# Patient Record
Sex: Female | Born: 1940
Health system: Southern US, Community
[De-identification: ages and names within clinical notes are randomized; demographics above are authoritative.]

## PROBLEM LIST (undated history)

## (undated) DIAGNOSIS — E785 Hyperlipidemia, unspecified: Secondary | ICD-10-CM

## (undated) DIAGNOSIS — I10 Essential (primary) hypertension: Secondary | ICD-10-CM

## (undated) DIAGNOSIS — R519 Headache, unspecified: Secondary | ICD-10-CM

## (undated) DIAGNOSIS — E119 Type 2 diabetes mellitus without complications: Secondary | ICD-10-CM

## (undated) DIAGNOSIS — R51 Headache: Secondary | ICD-10-CM

## (undated) DIAGNOSIS — K5792 Diverticulitis of intestine, part unspecified, without perforation or abscess without bleeding: Secondary | ICD-10-CM

## (undated) DIAGNOSIS — G8929 Other chronic pain: Secondary | ICD-10-CM

## (undated) DIAGNOSIS — M25569 Pain in unspecified knee: Secondary | ICD-10-CM

## (undated) HISTORY — PX: VESICOVAGINAL FISTULA CLOSURE W/ TAH: SUR271

## (undated) HISTORY — DX: Headache, unspecified: R51.9

## (undated) HISTORY — DX: Type 2 diabetes mellitus without complications: E11.9

## (undated) HISTORY — DX: Other chronic pain: G89.29

## (undated) HISTORY — DX: Hyperlipidemia, unspecified: E78.5

## (undated) HISTORY — DX: Headache: R51

## (undated) HISTORY — PX: OTHER SURGICAL HISTORY: SHX169

## (undated) HISTORY — DX: Essential (primary) hypertension: I10

## (undated) HISTORY — DX: Pain in unspecified knee: M25.569

---
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
## 1969-01-11 HISTORY — PX: OTHER SURGICAL HISTORY: SHX169

## 1971-01-12 HISTORY — PX: PARTIAL HYSTERECTOMY: SHX80

## 1997-05-10 ENCOUNTER — Ambulatory Visit (HOSPITAL_COMMUNITY): Admission: RE | Admit: 1997-05-10 | Discharge: 1997-05-10 | Payer: Self-pay | Admitting: Internal Medicine

## 1999-08-07 ENCOUNTER — Ambulatory Visit (HOSPITAL_COMMUNITY): Admission: RE | Admit: 1999-08-07 | Discharge: 1999-08-07 | Payer: Self-pay | Admitting: Gastroenterology

## 1999-09-03 ENCOUNTER — Ambulatory Visit (HOSPITAL_COMMUNITY): Admission: RE | Admit: 1999-09-03 | Discharge: 1999-09-03 | Payer: Self-pay | Admitting: Gastroenterology

## 1999-09-03 ENCOUNTER — Encounter: Payer: Self-pay | Admitting: Gastroenterology

## 1999-09-22 ENCOUNTER — Encounter: Admission: RE | Admit: 1999-09-22 | Discharge: 1999-09-22 | Payer: Self-pay | Admitting: Gastroenterology

## 1999-09-22 ENCOUNTER — Encounter: Payer: Self-pay | Admitting: Gastroenterology

## 2000-10-14 ENCOUNTER — Emergency Department (HOSPITAL_COMMUNITY): Admission: EM | Admit: 2000-10-14 | Discharge: 2000-10-15 | Payer: Self-pay | Admitting: Emergency Medicine

## 2001-01-30 ENCOUNTER — Encounter: Admission: RE | Admit: 2001-01-30 | Discharge: 2001-01-30 | Payer: Self-pay | Admitting: Gastroenterology

## 2001-01-30 ENCOUNTER — Encounter: Payer: Self-pay | Admitting: Gastroenterology

## 2001-06-21 ENCOUNTER — Encounter: Payer: Self-pay | Admitting: Internal Medicine

## 2001-06-21 ENCOUNTER — Encounter: Admission: RE | Admit: 2001-06-21 | Discharge: 2001-06-21 | Payer: Self-pay | Admitting: Internal Medicine

## 2001-07-27 ENCOUNTER — Encounter: Admission: RE | Admit: 2001-07-27 | Discharge: 2001-07-27 | Payer: Self-pay | Admitting: Neurological Surgery

## 2001-07-27 ENCOUNTER — Encounter: Payer: Self-pay | Admitting: Neurological Surgery

## 2001-08-10 ENCOUNTER — Encounter: Payer: Self-pay | Admitting: Neurological Surgery

## 2001-08-10 ENCOUNTER — Encounter: Admission: RE | Admit: 2001-08-10 | Discharge: 2001-08-10 | Payer: Self-pay | Admitting: Neurological Surgery

## 2001-10-21 ENCOUNTER — Encounter: Payer: Self-pay | Admitting: Internal Medicine

## 2001-10-21 ENCOUNTER — Encounter: Admission: RE | Admit: 2001-10-21 | Discharge: 2001-10-21 | Payer: Self-pay | Admitting: Internal Medicine

## 2004-06-22 ENCOUNTER — Encounter: Admission: RE | Admit: 2004-06-22 | Discharge: 2004-09-20 | Payer: Self-pay | Admitting: Internal Medicine

## 2004-06-26 ENCOUNTER — Encounter (INDEPENDENT_AMBULATORY_CARE_PROVIDER_SITE_OTHER): Payer: Self-pay | Admitting: *Deleted

## 2004-06-29 ENCOUNTER — Ambulatory Visit (HOSPITAL_COMMUNITY): Admission: RE | Admit: 2004-06-29 | Discharge: 2004-06-29 | Payer: Self-pay | Admitting: Gastroenterology

## 2005-03-09 ENCOUNTER — Encounter: Admission: RE | Admit: 2005-03-09 | Discharge: 2005-03-09 | Payer: Self-pay | Admitting: Internal Medicine

## 2006-02-25 ENCOUNTER — Emergency Department (HOSPITAL_COMMUNITY): Admission: EM | Admit: 2006-02-25 | Discharge: 2006-02-25 | Payer: Self-pay | Admitting: Emergency Medicine

## 2006-03-11 ENCOUNTER — Encounter: Admission: RE | Admit: 2006-03-11 | Discharge: 2006-03-11 | Payer: Self-pay | Admitting: Internal Medicine

## 2007-03-23 ENCOUNTER — Encounter: Admission: RE | Admit: 2007-03-23 | Discharge: 2007-03-23 | Payer: Self-pay | Admitting: Internal Medicine

## 2007-05-19 ENCOUNTER — Emergency Department (HOSPITAL_COMMUNITY): Admission: EM | Admit: 2007-05-19 | Discharge: 2007-05-19 | Payer: Self-pay | Admitting: Family Medicine

## 2007-10-24 ENCOUNTER — Emergency Department (HOSPITAL_COMMUNITY): Admission: EM | Admit: 2007-10-24 | Discharge: 2007-10-24 | Payer: Self-pay | Admitting: Family Medicine

## 2007-10-26 ENCOUNTER — Observation Stay (HOSPITAL_COMMUNITY): Admission: EM | Admit: 2007-10-26 | Discharge: 2007-10-27 | Payer: Self-pay | Admitting: Emergency Medicine

## 2008-03-26 ENCOUNTER — Encounter: Admission: RE | Admit: 2008-03-26 | Discharge: 2008-03-26 | Payer: Self-pay | Admitting: Internal Medicine

## 2008-04-03 ENCOUNTER — Encounter: Admission: RE | Admit: 2008-04-03 | Discharge: 2008-04-03 | Payer: Self-pay | Admitting: Internal Medicine

## 2008-05-16 ENCOUNTER — Encounter: Admission: RE | Admit: 2008-05-16 | Discharge: 2008-05-16 | Payer: Self-pay | Admitting: Internal Medicine

## 2008-10-31 ENCOUNTER — Encounter: Admission: RE | Admit: 2008-10-31 | Discharge: 2008-10-31 | Payer: Self-pay | Admitting: Internal Medicine

## 2009-03-28 ENCOUNTER — Encounter: Admission: RE | Admit: 2009-03-28 | Discharge: 2009-03-28 | Payer: Self-pay | Admitting: Internal Medicine

## 2009-06-12 ENCOUNTER — Emergency Department (HOSPITAL_COMMUNITY): Admission: EM | Admit: 2009-06-12 | Discharge: 2009-06-12 | Payer: Self-pay | Admitting: Emergency Medicine

## 2010-02-02 ENCOUNTER — Encounter: Payer: Self-pay | Admitting: Internal Medicine

## 2010-02-27 ENCOUNTER — Other Ambulatory Visit (HOSPITAL_COMMUNITY): Payer: Self-pay | Admitting: Internal Medicine

## 2010-02-27 DIAGNOSIS — Z1231 Encounter for screening mammogram for malignant neoplasm of breast: Secondary | ICD-10-CM

## 2010-03-30 ENCOUNTER — Ambulatory Visit (HOSPITAL_COMMUNITY)
Admission: RE | Admit: 2010-03-30 | Discharge: 2010-03-30 | Disposition: A | Discharge status: Home / Self Care | Payer: MEDICARE | Source: Ambulatory Visit | Attending: Internal Medicine | Admitting: Internal Medicine

## 2010-03-30 DIAGNOSIS — Z1231 Encounter for screening mammogram for malignant neoplasm of breast: Secondary | ICD-10-CM | POA: Insufficient documentation

## 2010-05-16 ENCOUNTER — Inpatient Hospital Stay (INDEPENDENT_AMBULATORY_CARE_PROVIDER_SITE_OTHER)
Admission: RE | Admit: 2010-05-16 | Discharge: 2010-05-16 | Disposition: A | Discharge status: Home / Self Care | Payer: MEDICARE | Source: Ambulatory Visit | Attending: Family Medicine | Admitting: Family Medicine

## 2010-05-16 DIAGNOSIS — IMO0001 Reserved for inherently not codable concepts without codable children: Secondary | ICD-10-CM

## 2010-05-26 NOTE — Consult Note (Signed)
NAME:  Vickie Brown, Vickie Brown NO.:  [id number]   MEDICAL RECORD NO.:  [id number]          PATIENT TYPE:  INP   LOCATION:  1317                         FACILITY:  [facility]   PHYSICIAN:  Alvy Beal, MD    DATE OF BIRTH:  08/31/1940   DATE OF CONSULTATION:  DATE OF DISCHARGE:                                 CONSULTATION   Consultation requested by the [facility] team for evaluation of back and  pelvic pain.   HISTORY:  This is a very pleasant 70 year old African American woman who  was admitted with a primary complaint of nausea and vomiting but also  has significant pelvic, lumbar spine and bilateral groin pain.  The  patient states she has had longstanding abdominal discomfort and she  recently had a colonoscopy (approximately 2 weeks ago).  Status post  that she has been having increasing abdominal/pelvic pain as well as  some increased pain in the legs with the right side being worse than the  left.  As a result of this she came back into the emergency room and was  admitted on October 25, 2007.   I was consulted as I was on-call.  At this point in time the patient  states that her pain is somewhat better since being admitted to the  hospital but she still has some discomfort.  Per my initial  recommendations on the day of admission she has finally had her MRI of  the pelvis and spine completed so that I could provide a thorough and  complete examination.   Her past medical, surgical, family, social history:  She has  hypertension, diabetes, reflux disease, migraine headaches, she is on  Actos, Januvia, lisinopril, Nexium, multivitamins and Cal-citrate.  NO  KNOWN DRUG ALLERGIES.  She has no history of tobacco, alcohol or illicit  drug use.   CLINICAL EXAM:  She is a pleasant woman who appears her stated age.  Cranial nerves II-XII are tested, they are intact.  No shortness of  breath, chest pain.  ABDOMEN:  Soft and nontender.  No pelvic pain with direct  palpation.  There is some tenderness over the pubic rami, left side greater than the  right.  She has no pain with internal/external rotation of the lower  extremities or range of motion at the hip, knee or ankle bilaterally but  there is some tenderness with the left side with straight leg  maneuvering but it localizes to the spine, it is a negative nerve root  tension sign.  She has symmetrical deep tendon reflexes, no clonus,  negative Babinski, no significant back pain with palpation.   The MRI of the lumbar spine is essentially unremarkable.  There is no  evidence of any significant stenosis, no neural compressive lesion.  There is some minor facet degeneration at 4-5 and 5-1 but no significant  pathology.  MRI of the pelvis reveals bilateral DJD of the hips.  There  is some edema in the left pubic bone consistent with a stress fracture  but no evidence of any abnormal soft tissue signal of the hip and  pelvis.  CLINICAL IMPRESSION:  1. Degenerative joint disease of the hips.  2. Stress fracture of the left pubic rami.   At this point time my recommendation is a walker, weightbearing as  tolerated, aggressive physical therapy and observation.  There is no  need for any surgical intervention.  At this point I will sign off as  there is no further orthopedic treatment options needed.  If there is  any questions or concerns please do not hesitate to contact me.      Alvy Beal, MD  Electronically Signed     DDB/MEDQ  D:  10/27/2007  T:  10/27/2007  Job:  [id number]

## 2010-05-26 NOTE — H&P (Signed)
NAME:  Vickie Brown, Vickie Brown NO.:  [id number]   MEDICAL RECORD NO.:  [id number]          PATIENT TYPE:  INP   LOCATION:  1317                         FACILITY:  [facility]   PHYSICIAN:  Vania Rea, M.D. DATE OF BIRTH:  01-May-1940   DATE OF ADMISSION:  10/25/2007  DATE OF DISCHARGE:                              HISTORY & PHYSICAL   PRIMARY CARE PHYSICIAN:  Robyn N. Allyne Gee, M.D.   CHIEF COMPLAINT:  Nausea and vomiting.   HISTORY OF THE PRESENT ILLNESS:  This is a 70 year old African American  lady with a history of diabetes, hypertension and episodic migraines who  was in her baseline state of health until about 10 days to 2 weeks ago  and she had a colonoscopy.  She rested for 24 hours after the  colonoscopy and woke up with left hip pain radiating into her groin and  her knee.  After some days without relief of the pain with regular  Tylenol or Tylenol with codeine she visited her primary care physician 2  days ago.  She still had pain and went to the [facility] emergency room  yesterday.  She was prescribed ibuprofen and Vicodin, but did not fill  his prescriptions because she recognized that the ibuprofen was  something she could not tolerate.  She went back to [facility] this  morning for reasons which are not clear, but this afternoon started to  develop persistent nausea and vomiting.  The patient states she vomited  and vomited until nothing would come up and she decided to come to the  emergency room this evening.  Labs were drawn and the patient was found  to be hypokalemia and hyponatremia; and, the Hospitalist Service was  called to assist with management.   The patient has also become noticeably more confused and forgetful than  usually.  Her son, who is with her, confirms that this is new onset  confusion since today.  There is no history of fever.  There is no  history of chest pains or shortness of breath.  There is no history of  lower extremity  edema.   PAST MEDICAL HISTORY:  1. Hypertension.  2. Diabetes.  3. GERD.  4. Migraine headaches since childhood.   MEDICATIONS:  1. Actos 15 mg daily.  2. Januvia 100 mg daily.  3. Lisinopril hydrochlorothiazide 10/12.5 daily.  4. Nexium 40 mg daily.  5. Vitamin D 1,000 international units daily.  6. Fish oil 1200 mg daily.  7. Caltrate 600  daily.   ALLERGIES:  No known drug allergies.   SOCIAL HISTORY:  There is no history of tobacco, alcohol or illicit drug  use.  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   FAMILY HISTORY:  The family history is significant for colon cancer and  diabetes.   REVIEW OF SYSTEMS:  The review of systems, other than that noted above,  is significant only for episodic headaches and radicular pain in her  upper back, which is chronic for which she takes the Tylenol with  codeine, and recent onset of radicular pain in the left  hip and the  groin radiating down the left leg.  Other than that 10-point review of  systems is unremarkable.   PHYSICAL EXAMINATION:  GENERAL APPEARANCE:  The patient is a very  pleasant elderly African American lady lying on the stretcher.  She is  able to give most of the history, but at times her memory fails and she  becomes slightly confused, which her son states is her baseline.  VITAL SIGNS:  The patient's temperature is 98.3.  Her pulse is 63,  respirations 17 and blood pressure 103/59.  She is saturating at 100% on  room air.  HEENT:  The patient's pupils are round and equal.  Mucous membranes are  pink and anicteric.  NECK:  The patient has no cervical lymphadenopathy.  No thyromegaly.  No  jugular venous distention.  CHEST:  The patient's chest is clear to auscultation bilaterally.  HEART:  Cardiovascular system - regular rhythm without murmur.  ABDOMEN:  The patient's abdomen is obese, soft and nontender.  She is  exquisitely tender over the pubic symphysis.  EXTREMITIES:  The patient's extremities  are without edema.  She has 3+  bounding pulses bilaterally.  She has no calf tenderness.  NEUROLOGIC EXAMINATION:  Neurologically cranial nerves II-XII are  grossly intact.  She has no neurological deficits.  She has grade 5  power throughout.  The gait is not tested.   LABORATORY DATA:  The patient's labs show her CBC is remarkable only for  a hemoglobin of 11.4; this is a little anemic, but is otherwise  unremarkable.  Her serum chemistries are notable for a sodium of 121,  potassium of 3.2, chloride of 83, BUN of 7, creatinine of 1.0, and  glucose of  103.  Ionized calcium was 1.09.  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   ASSESSMENT:  1. Acute gastritis of unclear etiology.  The patient denies any new      recent drug use.  2. History of gastroesophageal reflux disease.  3. Hyponatremia and hypokalemia likely due to persistent vomiting.  4. Altered mental status of unclear etiology possibly related to      metabolic derangement; cannot rule out cerebral tumor in view of      the persistent vomiting.  5. Recent onset of left hip pain with suprapubic tenderness rule out      stress fracture.  6. Hypertension, controlled.  7. Diabetes Type 2, controlled.   PLAN:  1. We will bring this lady in for hydration and replete electrolytes.  2. We will get a CT scan of the pelvic girdle and also the brain.  3. We will hold her medications for now and give them as necessary.  4. Other plans are as per orders.      Vania Rea, M.D.  Electronically Signed     LC/MEDQ  D:  10/25/2007  T:  10/26/2007  Job:  [id number]   cc:   Candyce Churn.  Allyne Gee, M.D.  Fax: [phone]   NUUVOZ DGU YQIH, M.D.  Fax: [phone]

## 2010-05-26 NOTE — Discharge Summary (Signed)
NAME:  Vickie Brown, Vickie Brown NO.:  [id number]   MEDICAL RECORD NO.:  [id number]          PATIENT TYPE:  INP   LOCATION:  1317                         FACILITY:  [facility]   PHYSICIAN:  Beckey Rutter, MD  DATE OF BIRTH:  07/16/1940   DATE OF ADMISSION:  10/25/2007  DATE OF DISCHARGE:  05/19/2007                               DISCHARGE SUMMARY   PRIMARY CARE PHYSICIAN:  Dr. Maxwell Caul.   CHIEF COMPLAINT AND HISTORY OF PRESENT ILLNESS:  This is a 70 year old  African American female who presented with nausea and vomiting.   HOSPITAL COURSE:  1. Nausea and vomiting resolved.  The patient is able to eat food      since first day of admission.  Now she is tolerating regular food.      The nausea was felt secondary to hyponatremia or viral gastritis.  2. Hyponatremia.  This has resolved as well, current sodium is 135.      The reason for hyponatremia could not be ascertained since the      hyponatremia corrected the next day to 132 and today is 135.  The      patient is able to tolerate food as discussed above.  3. Left leg and hip pain.  The patient had MRI and CT scan which      suggested possibility of stress fracture in the symphysis pubis.      The patient currently is slightly improved and she was advised to      gentle exercise.  The patient also advised to follow up with Dr.      Allyne Gee for further testing as needed for this abnormal signal on      the imaging.  Please refer to the CT and MRI results below.   DISCHARGE DIAGNOSES:  1. Nausea and vomiting resolved.  2. Hyponatremia resolved, etiology unknown.  3. History of diabetes.  4. Hypertension.  5. Gastroesophageal reflux disease.  6. Migraine headaches.   DISCHARGE MEDICATIONS:  1. Actos 50 mg daily.  2. Januvia 10 mg daily.  3. Lisinopril 20 mg p.o. daily.  Please note the patient was taking      hydrochlorothiazide in the same pill with lisinopril.  This      medicine was discontinued secondary  to hyponatremia.  The patient      will be continued on lisinopril as mentioned above.  4. Nexium 40 mg daily.  5. Vitamin D 1000 international units daily.  6. Fish oil 1200 mg daily.  7. Caltrate 600 mg daily.   HOSPITAL PROCEDURES:  1. X-ray to the hip on October 25, 2007, the patient was showing      sclerosis along the pubic symphysis probably representing osteitis      pubis.  2. Degenerative arthropathy of the hip.  3. CT head without contrast on October 26, 2007, impression was      reading no acute intracranial findings are identified.  Please note      that acute CVA can be occult on CT scan.  4. The patient had MRI of the spine on October 26, 2007, impression      was reading no acute/significant stenosis of the lumbar spine.      Minimal facet degenerative change at L4-L5.  5. MRI of the pelvis on October 26, 2007, impression was showing edema      like signal abnormality in the left pubic bone adjacent to pubic      symphysis.  This could reflect a stress reaction or possible stress      fracture.  Symmetric hip joint degenerative changes bilaterally.      No findings of stress fracture or avascular necrosis involving the      hips.  Normal MRI appearance of the surrounding hip and pelvic      musculature.  No significant intrapelvic abnormalities are seen.   HOSPITAL CONSULTATION:  Was done by orthopedic surgeon, Dr. Genia Hotter.   DISCHARGE PLAN:  The patient is discharged today to continue walking  with a walker as discussed with her.  The patient should take pain  medication as recommended and the prescription for lisinopril without  hydrochlorothiazide was prescribed.  She was advised to follow up with  Dr. Allyne Gee within a week or two.      Beckey Rutter, MD  Electronically Signed     EME/MEDQ  D:  10/27/2007  T:  10/27/2007  Job:  [id number]   cc:   Dr Maxwell Caul

## 2010-05-29 NOTE — Procedures (Signed)
[place]. [facility]  Patient:    Vickie Brown, Vickie Brown                      MRN: [id number] Proc. Date: 08/07/99 Adm. Date:  [id number] Disc. Date: [id number] Attending:  Charna Elizabeth                           Procedure Report  PROCEDURE PERFORMED:  Esophagogastroduodenoscopy.  ENDOSCOPIST:  Anselmo Rod, M.D.  INSTRUMENT USED:  Olympus video panendoscope.  INDICATION FOR PROCEDURE:  70 year old black female with a history of epigastric pain, rule out peptic ulcer disease, esophagitis, gastritis, et Karie Soda.  PROCEDURE PREPARATION:  Informed consent was procured from the patient.  The patient was fasted for eight hours prior to the procedure.  PREPROCEDURE PHYSICAL:  VITAL SIGNS:  The patient has stable vital signs. NECK:  Supple.  CHEST:  Clear to auscultation.  HEART:  S1, S2 regular. ABDOMEN:  Soft, with normal abdominal bowel sounds.  DESCRIPTION OF PROCEDURE:  The patient was placed in the left lateral decubitus position and sedated with 50 mg of Demerol and 5 mg of Versed intravenously.  Once the patient was adequately sedated, maintained on low flow oxygen and continuous cardiac monitoring, the Olympus video panendoscope was advanced through the mouthpiece, over the tongue, into the esophagus under direct vision.  The entire esophagus appeared normal without evidence of ring stricture, masses, lesions or esophagitis.  The scope was then advanced in the stomach.  There was no evidence of a hiatal hernia.  No erosions, ulcerations, masses or polyps were seen.  The duodenal bulb and the small bowel distal to the bulb appeared normal.  There was no outlet obstruction.  The patient tolerated the procedure well without complication.  IMPRESSION:  Normal EGD.  RECOMMENDATION:  Proceed with colonoscopy at this time. DD:  08/07/99 TD:  08/09/99 Job: [id number]

## 2010-05-29 NOTE — Procedures (Signed)
[place]. [facility]  Patient:    Vickie Brown, Vickie Brown                      MRN: [id number] Proc. Date: 08/07/99 Adm. Date:  [id number] Disc. Date: [id number] Attending:  Charna Elizabeth                           Procedure Report  PROCEDURE PERFORMED:  Colonoscopy.  ENDOSCOPIST:  Anselmo Rod, M.D.  INSTRUMENT USED:  Olympus video colonoscope.  INDICATION FOR PROCEDURE:  Family history of colon cancer in a 70 year old black female, rule out colonic polyps, masses, hemorrhoids, et Karie Soda.  PROCEDURE PREPARATION:  Informed consent was procured from the patient.  The patient was fasted for eight hours prior to the procedure and prepped with a bottle of Magnesium Citrate and a gallon of NuLYTELY the night prior to procedure.  PREPROCEDURE PHYSICAL:  VITAL SIGNS:  The patient has stable vital signs. NECK:  Supple.  CHEST:  Clear to auscultation.  HEART:  S1, S2 regular. ABDOMEN:  Soft, with normal abdominal bowel sounds.  DESCRIPTION OF PROCEDURE:  The patient was placed in the left lateral decubitus position and sedated with Demerol and Versed for the EGD.  No additional sedation was given for the colonoscopy.  Once the patient was adequately positioned, maintained on low flow oxygen and continuous cardiac monitoring, the Olympus video colonoscope was advanced from the rectum to the cecum without difficulty.  Except for a few scattered diverticula and internal hemorrhoids, no other abnormalities were seen.  The patient tolerated the procedure well without complications.  The procedure was complete up to the cecum.  The ileocecal valve was clearly visualized.  IMPRESSION: 1.  Normal colonoscopy except for a small scattered diverticula. 2.  Small nonbleeding internal hemorrhoid.  RECOMMENDATION: 1.  Considering her family history of colon cancer, repeat colonoscopy is     recommended in the next five years or earlier if need be. 2.  The patient is to report  any abnormal symptoms like change in bowel     habits, rectal bleeding, et Karie Soda, to the office. 3.  Further recommendation will be made on a p.r.n. basis. DD:  08/07/99 TD:  08/09/99 Job: [id number]

## 2010-05-29 NOTE — Op Note (Signed)
NAME:  Vickie Brown, Vickie Brown               ACCOUNT NO.:  [id number]   MEDICAL RECORD NO.:  [id number]          PATIENT TYPE:  AMB   LOCATION:  ENDO                         FACILITY:  [facility]   PHYSICIAN:  Anselmo Rod, M.D.  DATE OF BIRTH:  12-06-1940   DATE OF PROCEDURE:  06/29/2004  DATE OF DISCHARGE:                                 OPERATIVE REPORT   PROCEDURE:  Colonoscopy with snare polypectomy x3.   ENDOSCOPIST:  Anselmo Rod, M.D.   INSTRUMENT USED:  Olympus video colonoscope.   INDICATIONS FOR PROCEDURE:  This 70 year old African/American female with a  family history of colon cancer undergoing a screening colonoscopy.  The  patient has some change in bowel habits in the recent past.  Rule out  colonic polyps, masses, etc.   PRE-PROCEDURE PREPARATION:  An informed consent was procured from the  patient.  The patient was fasted for eight hours prior to the procedure and  prepped with a bottle of magnesium citrate and one gallon of GoLYTELY on the  night prior to the procedure.  The risks and benefits of the procedure,  including a 10% mis-rate of cancer and polyps, were discussed with the  patient as well.   PRE-PROCEDURE PHYSICAL EXAMINATION:  VITAL SIGNS:  Stable.  NECK:  Supple.  CHEST:  Clear to auscultation.  HEART:  S1, S2 regular.  ABDOMEN:  Soft, with normal bowel sounds.   DESCRIPTION OF PROCEDURE:  The patient was placed in the left lateral  decubitus position and sedated with 80 mg of Demerol and 8 mg of Versed in  slow incremental doses.  Once the patient was adequately sedated and  maintained on low-flow oxygen and continuous cardiac monitoring, the Olympus  video colonoscope was advanced into the rectum to the cecum.  The  appendicular orifice and ileocecal valve were visualized and photographed.  Two sessile polyps were snared (hot snare), from the distal right colon and  another small sessile polyp was snared (hot snare), from 70 cm.  Small  internal  hemorrhoids were seen on retroflexion.  The rest of the exam was  unremarkable.   The patient tolerated the procedure well without complications.   IMPRESSION:  1.  Three polyps removed by snare polypectomy and two by cold biopsy from      the colon.  (See the description above.)  2.  Small internal hemorrhoids.  3.  No evidence of diverticulosis.   RECOMMENDATIONS:  1.  Await pathology results.  2.  Avoid all non-steroidals including aspirin for the next two weeks.  3.  Repeat colonoscopy, depending upon the pathology results.  4.  Outpatient followup as the need arises in the future.       JNM/MEDQ  D:  06/29/2004  T:  06/29/2004  Job:  [id number]   cc:   Olene Craven, M.D.  [address]  [address]  [place]  [place] [address]  Fax: [phone]

## 2010-06-19 ENCOUNTER — Encounter: Payer: Self-pay | Admitting: Internal Medicine

## 2010-06-22 ENCOUNTER — Ambulatory Visit (INDEPENDENT_AMBULATORY_CARE_PROVIDER_SITE_OTHER): Payer: Medicare Other | Admitting: Internal Medicine

## 2010-06-22 ENCOUNTER — Ambulatory Visit (INDEPENDENT_AMBULATORY_CARE_PROVIDER_SITE_OTHER)
Admission: RE | Admit: 2010-06-22 | Discharge: 2010-06-22 | Disposition: A | Discharge status: Home / Self Care | Payer: Medicare Other | Source: Ambulatory Visit | Attending: Internal Medicine | Admitting: Internal Medicine

## 2010-06-22 ENCOUNTER — Other Ambulatory Visit (INDEPENDENT_AMBULATORY_CARE_PROVIDER_SITE_OTHER): Payer: Medicare Other

## 2010-06-22 ENCOUNTER — Encounter: Payer: Self-pay | Admitting: Internal Medicine

## 2010-06-22 VITALS — BP 112/76 | HR 87 | Ht 63.0 in | Wt 164.8 lb

## 2010-06-22 DIAGNOSIS — IMO0001 Reserved for inherently not codable concepts without codable children: Secondary | ICD-10-CM

## 2010-06-22 DIAGNOSIS — R0609 Other forms of dyspnea: Secondary | ICD-10-CM

## 2010-06-22 DIAGNOSIS — M791 Myalgia, unspecified site: Secondary | ICD-10-CM | POA: Insufficient documentation

## 2010-06-22 DIAGNOSIS — R0989 Other specified symptoms and signs involving the circulatory and respiratory systems: Secondary | ICD-10-CM

## 2010-06-22 LAB — CBC WITH DIFFERENTIAL/PLATELET
Eosinophils Relative: 1.4 % (ref 0.0–5.0)
Lymphocytes Relative: 23.1 % (ref 12.0–46.0)
Monocytes Relative: 5.9 % (ref 3.0–12.0)
Neutrophils Relative %: 69.2 % (ref 43.0–77.0)
Platelets: 204 10*3/uL (ref 150.0–400.0)
WBC: 3.7 10*3/uL — ABNORMAL LOW (ref 4.5–10.5)

## 2010-06-22 NOTE — Patient Instructions (Signed)
Orders- dx dyspnea on exertion, myalgia  Schedule PFT with 6 MWT CXR  Labs- sed rate, CPK, ANA, TSH, D-dimer, CBC w/ diff

## 2010-06-22 NOTE — Progress Notes (Signed)
  Subjective:    Patient ID: Vickie Brown, female    DOB: 12-14-1940, 70 y.o.   MRN: [id number]  HPI 06/22/10- 70 yoF referred courtesy of Dr Allyne Gee for complaint of dyspnea with exertion over the past 6-12 months. Gradually worse without distinct onset but no day to day change. Dyspnea with stairs or walking 1 block. Some wheeze. Metered inhaler is some help. PFT was normal at her primary office 02/25/10 with FEV1/FVC 0.79, DLCO 0.79. Cardiology eval last Fall "OK" per patient- not clear what tests were done. She denies hx of lung and heart disease or anemia. Denies chest pain, swelling, weight loss, fever or cough.  Admits muscles ache.   Review of Systems Constitutional:   No weight loss, night sweats,  Fevers, chills, fatigue, lassitude. HEENT:   No headaches,  Difficulty swallowing,  Tooth/dental problems,  Sore throat,                No sneezing, itching, ear ache, nasal congestion, post nasal drip,   CV:  No chest pain,  Orthopnea, PND, swelling in lower extremities, anasarca, dizziness, palpitations  GI  No heartburn, indigestion,  nausea, vomiting, diarrhea, change in bowel habits, loss of appetite  Resp: No shortness of breath with exertion or at rest.  No excess mucus, no productive cough,  Positive- non-productive cough.  No coughing up of blood.  No change in color of mucus.  No wheezing.  Skin: no rash or lesions.  GU: no dysuria, change in color of urine, no urgency or frequency.  No flank pain.  MS:  No joint pain or swelling.  No decreased range of motion.  No back pain.  Psych:  No change in mood or affect. No depression or anxiety.  No memory loss.      Objective:   Physical Exam General- Alert, Oriented, Affect-appropriate, Distress- none acute   wdwn  Skin- rash-none, lesions- none, excoriation- none  Lymphadenopathy- none  Head- atraumatic  Eyes- Gross vision intact, PERRLA, conjunctivae clear, secretions  Ears- Hearing, canals, Tm- normal  Nose-  Clear, Septal dev, mucus, polyps, erosion, perforation   Throat- Mallampati II , mucosa clear , drainage- none, tonsils- atrophic  Neck- flexible , trachea midline, no stridor , thyroid nl, carotid no bruit  Chest - symmetrical excursion , unlabored     Heart/CV- RRR , no murmur , no gallop  , no rub, nl s1 s2                     - JVD- none , edema- none, stasis changes- none, varices- none     Lung- clear to P&A, wheeze- none, cough- none , dullness-none, rub- none     Chest wall-  Abd- tender-no, distended-no, bowel sounds-present, HSM- no  Br/ Gen/ Rectal- Not done, not indicated  Extrem- cyanosis- none, clubbing, none, atrophy- none, strength- nl.   Varices thigh, no cords.  Neuro- grossly intact to observation         Assessment & Plan:

## 2010-06-22 NOTE — Assessment & Plan Note (Signed)
Her complaint seems real, but it may not be pulmonary. We will get results of cardiac tests from Dr Allyne Gee, repeat PFT to try again with lung volumes, get bloods to look for myopathy, hypothyroid, anemia

## 2010-06-23 LAB — CK TOTAL AND CKMB (NOT AT ARMC)
CK, MB: 0.9 ng/mL (ref 0.3–4.0)
Total CK: 123 U/L (ref 7–177)

## 2010-06-23 LAB — D-DIMER, QUANTITATIVE: D-Dimer, Quant: 0.4 ug/mL-FEU (ref 0.00–0.48)

## 2010-06-24 NOTE — Progress Notes (Signed)
Quick Note:  Pt aware of results. ______ 

## 2010-06-26 ENCOUNTER — Encounter: Payer: Self-pay | Admitting: Internal Medicine

## 2010-06-26 NOTE — Assessment & Plan Note (Signed)
Complaints of dyspnea and muscle pain. Will look for myopathy/

## 2010-07-07 ENCOUNTER — Emergency Department (HOSPITAL_COMMUNITY)
Admission: EM | Admit: 2010-07-07 | Discharge: 2010-07-07 | Disposition: A | Discharge status: Home / Self Care | Payer: Medicare Other | Attending: Emergency Medicine | Admitting: Emergency Medicine

## 2010-07-07 ENCOUNTER — Emergency Department (HOSPITAL_COMMUNITY): Payer: Medicare Other

## 2010-07-07 DIAGNOSIS — E119 Type 2 diabetes mellitus without complications: Secondary | ICD-10-CM | POA: Insufficient documentation

## 2010-07-07 DIAGNOSIS — K5732 Diverticulitis of large intestine without perforation or abscess without bleeding: Secondary | ICD-10-CM | POA: Insufficient documentation

## 2010-07-07 DIAGNOSIS — R109 Unspecified abdominal pain: Secondary | ICD-10-CM | POA: Insufficient documentation

## 2010-07-07 LAB — CBC
HCT: 34 % — ABNORMAL LOW (ref 36.0–46.0)
MCV: 85.2 fL (ref 78.0–100.0)
Platelets: 236 10*3/uL (ref 150–400)
RBC: 3.99 MIL/uL (ref 3.87–5.11)
RDW: 14.2 % (ref 11.5–15.5)
WBC: 8 10*3/uL (ref 4.0–10.5)

## 2010-07-07 LAB — URINALYSIS, ROUTINE W REFLEX MICROSCOPIC
Bilirubin Urine: NEGATIVE
Nitrite: NEGATIVE
Specific Gravity, Urine: 1.009 (ref 1.005–1.030)
Urobilinogen, UA: 0.2 mg/dL (ref 0.0–1.0)
pH: 6 (ref 5.0–8.0)

## 2010-07-07 LAB — DIFFERENTIAL
Basophils Absolute: 0 10*3/uL (ref 0.0–0.1)
Lymphocytes Relative: 24 % (ref 12–46)
Lymphs Abs: 1.9 10*3/uL (ref 0.7–4.0)
Neutrophils Relative %: 67 % (ref 43–77)

## 2010-07-07 LAB — LIPASE, BLOOD: Lipase: 22 U/L (ref 11–59)

## 2010-07-07 LAB — URINE MICROSCOPIC-ADD ON

## 2010-07-07 LAB — COMPREHENSIVE METABOLIC PANEL
Albumin: 3.4 g/dL — ABNORMAL LOW (ref 3.5–5.2)
Alkaline Phosphatase: 61 U/L (ref 39–117)
BUN: 8 mg/dL (ref 6–23)
Calcium: 9.5 mg/dL (ref 8.4–10.5)
GFR calc Af Amer: >60 mL/min (ref >60)
Glucose, Bld: 103 mg/dL — ABNORMAL HIGH (ref 70–99)
Potassium: 3.5 mEq/L (ref 3.5–5.1)
Total Protein: 7.4 g/dL (ref 6.0–8.3)

## 2010-07-07 MED ORDER — IOHEXOL 300 MG/ML  SOLN
100.0000 mL | Freq: Once | INTRAMUSCULAR | Status: AC | PRN
  Administered 2010-07-07: 100 mL via INTRAVENOUS

## 2010-07-08 ENCOUNTER — Ambulatory Visit: Payer: Medicare Other

## 2010-07-08 ENCOUNTER — Inpatient Hospital Stay (HOSPITAL_COMMUNITY)
Admission: EM | Admit: 2010-07-08 | Discharge: 2010-07-12 | DRG: 378 | Disposition: A | Discharge status: Home / Self Care | Payer: Medicare Other | Attending: Family Medicine | Admitting: Family Medicine

## 2010-07-08 DIAGNOSIS — I10 Essential (primary) hypertension: Secondary | ICD-10-CM | POA: Diagnosis present

## 2010-07-08 DIAGNOSIS — E785 Hyperlipidemia, unspecified: Secondary | ICD-10-CM | POA: Diagnosis present

## 2010-07-08 DIAGNOSIS — E119 Type 2 diabetes mellitus without complications: Secondary | ICD-10-CM | POA: Diagnosis present

## 2010-07-08 DIAGNOSIS — N289 Disorder of kidney and ureter, unspecified: Secondary | ICD-10-CM | POA: Diagnosis present

## 2010-07-08 DIAGNOSIS — K219 Gastro-esophageal reflux disease without esophagitis: Secondary | ICD-10-CM | POA: Diagnosis present

## 2010-07-08 DIAGNOSIS — D649 Anemia, unspecified: Secondary | ICD-10-CM | POA: Diagnosis present

## 2010-07-08 DIAGNOSIS — K5733 Diverticulitis of large intestine without perforation or abscess with bleeding: Principal | ICD-10-CM | POA: Diagnosis present

## 2010-07-08 DIAGNOSIS — Z79899 Other long term (current) drug therapy: Secondary | ICD-10-CM

## 2010-07-08 DIAGNOSIS — I959 Hypotension, unspecified: Secondary | ICD-10-CM | POA: Diagnosis not present

## 2010-07-08 DIAGNOSIS — E871 Hypo-osmolality and hyponatremia: Secondary | ICD-10-CM | POA: Diagnosis present

## 2010-07-08 DIAGNOSIS — G43909 Migraine, unspecified, not intractable, without status migrainosus: Secondary | ICD-10-CM | POA: Diagnosis present

## 2010-07-08 LAB — CBC
MCH: 27.8 pg (ref 26.0–34.0)
MCHC: 33 g/dL (ref 30.0–36.0)
MCV: 84.3 fL (ref 78.0–100.0)
Platelets: 258 10*3/uL (ref 150–400)
RBC: 3.88 MIL/uL (ref 3.87–5.11)
RDW: 14 % (ref 11.5–15.5)

## 2010-07-08 LAB — DIFFERENTIAL
Basophils Relative: 0 % (ref 0–1)
Eosinophils Absolute: 0 10*3/uL (ref 0.0–0.7)
Eosinophils Relative: 0 % (ref 0–5)
Lymphs Abs: 1.7 10*3/uL (ref 0.7–4.0)
Monocytes Absolute: 0.5 10*3/uL (ref 0.1–1.0)
Monocytes Relative: 7 % (ref 3–12)

## 2010-07-08 LAB — URINE CULTURE
Colony Count: NO GROWTH
Culture  Setup Time: 201206262107

## 2010-07-08 LAB — COMPREHENSIVE METABOLIC PANEL
AST: 18 U/L (ref 0–37)
CO2: 27 mEq/L (ref 19–32)
Calcium: 9.1 mg/dL (ref 8.4–10.5)
Creatinine, Ser: 0.76 mg/dL (ref 0.50–1.10)
GFR calc Af Amer: >60 mL/min (ref >60)
GFR calc non Af Amer: >60 mL/min (ref >60)
Sodium: 130 mEq/L — ABNORMAL LOW (ref 135–145)
Total Protein: 6.9 g/dL (ref 6.0–8.3)

## 2010-07-09 LAB — GLUCOSE, CAPILLARY
Glucose-Capillary: 106 mg/dL — ABNORMAL HIGH (ref 70–99)
Glucose-Capillary: 120 mg/dL — ABNORMAL HIGH (ref 70–99)

## 2010-07-09 LAB — CBC
HCT: 29.9 % — ABNORMAL LOW (ref 36.0–46.0)
Platelets: 237 10*3/uL (ref 150–400)
RBC: 3.52 MIL/uL — ABNORMAL LOW (ref 3.87–5.11)
RDW: 14.2 % (ref 11.5–15.5)
WBC: 6 10*3/uL (ref 4.0–10.5)

## 2010-07-09 LAB — COMPREHENSIVE METABOLIC PANEL
AST: 14 U/L (ref 0–37)
Albumin: 3 g/dL — ABNORMAL LOW (ref 3.5–5.2)
Alkaline Phosphatase: 51 U/L (ref 39–117)
CO2: 28 mEq/L (ref 19–32)
Chloride: 101 mEq/L (ref 96–112)
Potassium: 3.8 mEq/L (ref 3.5–5.1)
Total Bilirubin: 0.2 mg/dL — ABNORMAL LOW (ref 0.3–1.2)

## 2010-07-09 LAB — LIPID PANEL
LDL Cholesterol: 58 mg/dL (ref 0–99)
VLDL: 10 mg/dL (ref 0–40)

## 2010-07-10 LAB — BASIC METABOLIC PANEL
Chloride: 106 mEq/L (ref 96–112)
Creatinine, Ser: 0.73 mg/dL (ref 0.50–1.10)
GFR calc Af Amer: >60 mL/min (ref >60)
Potassium: 3.8 mEq/L (ref 3.5–5.1)

## 2010-07-10 LAB — CBC
MCV: 84.6 fL (ref 78.0–100.0)
Platelets: 232 10*3/uL (ref 150–400)
RDW: 14 % (ref 11.5–15.5)
WBC: 3.8 10*3/uL — ABNORMAL LOW (ref 4.0–10.5)

## 2010-07-10 LAB — GLUCOSE, CAPILLARY
Glucose-Capillary: 106 mg/dL — ABNORMAL HIGH (ref 70–99)
Glucose-Capillary: 81 mg/dL (ref 70–99)

## 2010-07-11 LAB — GLUCOSE, CAPILLARY
Glucose-Capillary: 74 mg/dL (ref 70–99)
Glucose-Capillary: 96 mg/dL (ref 70–99)
Glucose-Capillary: 100 mg/dL — ABNORMAL HIGH (ref 70–99)

## 2010-07-11 LAB — BASIC METABOLIC PANEL
BUN: <3 mg/dL — ABNORMAL LOW (ref 6–23)
CO2: 27 mEq/L (ref 19–32)
Chloride: 110 mEq/L (ref 96–112)
GFR calc non Af Amer: >60 mL/min (ref >60)
Glucose, Bld: 109 mg/dL — ABNORMAL HIGH (ref 70–99)
Potassium: 3.7 mEq/L (ref 3.5–5.1)
Sodium: 142 mEq/L (ref 135–145)

## 2010-07-11 LAB — CBC
HCT: 29.7 % — ABNORMAL LOW (ref 36.0–46.0)
Hemoglobin: 9.9 g/dL — ABNORMAL LOW (ref 12.0–15.0)
RBC: 3.52 MIL/uL — ABNORMAL LOW (ref 3.87–5.11)
WBC: 3.5 10*3/uL — ABNORMAL LOW (ref 4.0–10.5)

## 2010-07-12 LAB — GLUCOSE, CAPILLARY: Glucose-Capillary: 114 mg/dL — ABNORMAL HIGH (ref 70–99)

## 2010-07-12 NOTE — H&P (Signed)
NAME:  Vickie Brown, Vickie Brown NO.:  [id number]  MEDICAL RECORD NO.:  [id number]  LOCATION:  [facility]                         FACILITY:  [facility]  PHYSICIAN:  Lonia Blood, M.D.      DATE OF BIRTH:  Jul 23, 1940  DATE OF ADMISSION:  07/08/2010 DATE OF DISCHARGE:                             HISTORY & PHYSICAL   PRIMARY CARE PHYSICIAN:  Robyn N. Allyne Gee, MD  PRESENTING COMPLAINT:  Abdominal pain, nausea, vomiting.  HISTORY OF PRESENT ILLNESS:  The patient is a 70 year old female with known history of diabetes and prior history of diverticulitis, who was seen in the emergency room yesterday for 4 days of continuous nausea, vomiting, and diarrhea.  She has had abdominal pain, rated as 8/10, mainly in the left lower quadrant, but also going to the right upper quadrant.  Associated with some nausea, went to brush, some vomiting initially.  She has noted her stool was watery, continuous, but also dark greenish in nature.  She has had at least 3 to 4 episodes of stools almost every day.  She has had lot of cramping on and off.  She was evaluated and after a CT scan showed acute diverticulitis, the patient was asked to stay in the hospital, but she decided to go home.  She wanted to try it out at home.  Unfortunately, the pain was so severe that she could not stay at home and decided to come in.  She denied any fever today.  No chills.  She is still nauseated, but no vomiting today. The pain is worsened with movement and not relieved by anything.  PAST MEDICAL HISTORY: 1. Diabetes type 2. 2. Hyponatremia. 3. History of sigmoid diverticulitis. 4. Hypertension. 5. Migraine headaches. 6. Degenerative disk disease of the hips. 7. History of stress fracture of the left pubic rami. 8. GERD. 9. Hyperlipidemia.  ALLERGIES:  She has no known drug allergies.  CURRENT MEDICATIONS: 1. Januvia 100 mg daily. 2. Actos 15 mg daily. 3. Tylenol No. 3 one tablet as needed. 4. Nexium 40 mg  p.r.n. 5. Caltrate 600 mg daily. 6. Vitamin D3 twice a day. 7. Vitamin B12 with liver 1000 mg daily. 8. Flaxseed 1000 mg twice a day. 9. Hydrochlorothiazide 12.5 mg daily. 10.Pravastatin 40 mg daily.  SOCIAL HISTORY:  The patient lives in [place].  She lives alone and very much active.  Denied tobacco, alcohol, or IV drug use.  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  FAMILY HISTORY:  Significant for diabetes and colon cancer.  REVIEW OF SYSTEMS:  All systems reviewed are negative except per HPI.  PHYSICAL EXAMINATION:  VITAL SIGNS:  Her temperature is 98.4, blood pressure is 103/56 with a pulse of 66, respiratory rate 18, and saturation is 98% on room air. GENERAL:  She is awake, alert, oriented, pleasant woman.  She is in no acute distress. HEENT:  PERRL.  EOMI.  No pallor.  No jaundice.  No rhinorrhea. NECK:  Supple.  No JVD.  No lymphadenopathy. RESPIRATORY:  Shows good air entry bilaterally.  No wheezes.  No rales. No crackles. CARDIOVASCULAR SYSTEM:  She has S1 and S2.  No audible murmur. ABDOMEN:  Soft, full.  No organomegaly, but diffuse tenderness, worse in the left lower quadrant.  She has exaggerated bowel sounds. EXTREMITIES:  No edema, cyanosis, or clubbing. SKIN:  No rashes.  No ulcers.  LABORATORY DATA:  White count today 7.7, hemoglobin 10.8 with platelet count of 258.  Sodium is 130, potassium 3.6, chloride 93, CO2 of 97, glucose 148, BUN 6, creatinine 0.76, albumin 3.6, calcium 9.1, total protein 6.9, the rest of the LFTs are within normal.  ASSESSMENT:  This is a 70 year old female with known history of diverticulitis, who had a CT scan from yesterday that shows acute sigmoid diverticulitis with continued abdominal pain, nausea, and vomiting.  The patient seems to have failed outpatient therapy.  PLAN: 1. Acute sigmoid diverticulitis.  Admit the patient to regular bed.     Keep her only on clear liquids.  IV Flagyl and ciprofloxacin, pain      control and nausea control.  Once the patient's symptoms improved     and her pain is tolerable, we will transition her to diabetic diet     prior to discharge on antibiotics and pain medicine, to complete     her treatment at home. 2. Diabetes.  I will put her on sliding scale insulin and as she     improves, I will restart the Januvia. 3. Hyponatremia, more than likely from dehydration.  I will give her     some saline and follow up closely. 4. Dehydration.  Again secondary to decreased oral intake since her     symptoms began.  We will continue with fluid hydration and follow     her closely. 5. Hypertension.  The patient's blood pressure is reasonable at this     point.  We will gradually restart her home medications. 6. GERD.  I will put on PPI. 7. Hyperlipidemia.  I will hold her pravastatin now until she is able     to take p.o. fully. 8. Normocytic anemia, not sure of the cause.  I will check stool     guaiacs.  More than likely, this is secondary to her diverticulitis     or rather diverticular bleed with slow bleed.     Lonia Blood, M.D.     Verlin Grills  D:  07/08/2010  T:  07/09/2010  Job:  [id number]  Electronically Signed by Lonia Blood M.D. on 07/12/2010 12:51:06 AM

## 2010-07-13 NOTE — Discharge Summary (Signed)
NAME:  Vickie, Brown NO.:  [id number]  MEDICAL RECORD NO.:  [id number]  LOCATION:  [facility]                         FACILITY:  [facility]  PHYSICIAN:  Pleas Koch, MD        DATE OF BIRTH:  [birth year]  DATE OF ADMISSION:  07/08/2010 DATE OF DISCHARGE:  07/12/2010                              DISCHARGE SUMMARY   DISCHARGE DIAGNOSES: 1. Acute diverticulitis. 2. Diabetes mellitus. 3. Hyponatremia. 4. Hypertension which is relative. 5. Anemia likely secondary to acute diverticulitis. 6. Hyperlipidemia. 7. Renal insufficiency secondary to acute diverticulitis.  DISCHARGE MEDICATIONS: 1. Continue losartan 1 tablet daily. 2. Januvia 100 mg 1 tablet q.a.m. 3. Pravachol 40 mg 1 tablet daily. 4. Tylenol No. 3 1 tablet t.i.d. p.r.n. 5. Nexium 40 mg 1 tab every other day 6. Flaxseed oil 1000 mg 2 to 3 taps daily. 7. Calcium 600 mg 1 cap b.i.d. 8. HCTZ 12.5 mg 1 tablet daily. 9. Actos 15 mg p.o. q.p.m. one tab (this may need to be discontinued     by primary care physician in favor of something with less side     effects such as metformin). 10.Vitamin B12 one tablet daily. 11.Vitamin D3 over-the-counter 1 tablet b.i.d.  Please note the patient will complete a 5-day course of ciprofloxacin 500 mg 1 tablet b.i.d., prescription quantity sufficient for 10 as well as metronidazole 500 mg 1 capsule t.i.d. for the same duration of time.  PERTINENT IMAGING STUDIES:  CT abdomen and pelvis July 07, 2010 shows short segmental sigmoid colonic wall thickening with stranding and trace pelvic sidewall fluid most consistent with diverticulitis.  Consider colonoscopy after symptoms resolve to ensure absence of underlying malignancy, which could mimic this, no free air evidence or evidence of abscess at this time.  Please see full dictation number [id number] for admission details.  Briefly, this is a 70 year old female with known history of diabetes, prior diverticulitis who presented to  emergency room the day prior to admission with 4 days of continuous nausea and vomiting.  She has abdominal pain rated as 8/10, mainly left lower quadrant but also going to the right upper quadrant.  She also noticed her stools watery, continuous but somewhat greenish in color.  She has had 3 to 4 stools on Saturday and lot of cramping and ultimately went home prior to admission on the first time because she wanted to try all these.  She then started having increasing pain in the abdomen and came back in on 27th with same issue.  The pain is worsened by movement.  Vitals on admission, temperature 98.4, blood pressure 103/54, which seems her baseline pulse 66, respirations 18, satting 98% on room air. She is awake and oriented.  She had full abdomen, no organomegaly but diffuse tenderness, worse in left lower quadrant, exaggerated bowel sounds as well.  White count on admit 7.7, hemoglobin 10.8.  Sodium 130, potassium 3.6, chloride 93, BUN/creatinine 6 and 0.76 and LFTs were within normal.  HOSPITAL COURSE:  The patient was kept on IV Cipro and Flagyl and was continued on this to the point that we felt we would be able to transition her to oral.  She was transitioned  to oral on July 10, 2010, and continued to do well.  She still had abdominal pain; however, which kept her here and still experienced some diarrhea.  I have given her a handout regarding diverticulitis detailing its care and management.  She does ask good questions about whether avoiding corn, seeds, nuts and there is a [employer] Followup Study which was inverse association with nut and popcorn consumption and the risks of diverticulitis and bleeding and there is no association between this.  I have handed out these things as well.  I would recommend that she consume a high fiber diet and complete the antibiotics and she will need followup in 2-6 weeks postop for possible colonoscopy if this is thought to be  advisable by primary care physician who obviously knows her better. 1. Diabetes mellitus.  She was transitioned back on to her regular     meds.  On discharge, her A1c was well controlled, however, I would     recommend discontinuing her Actos given multiple studies detailing     that this can cause or make heart failure worse and this should be     followed by her outpatient physician.  Her A1c was 6.3. 2. Hyponatremia, this completely resolved and I would attribute this     to her nausea, vomiting and poor p.o. intake. 3. Hypertension.  This is relative.  She is on a bunch of medications     and we will continue them cautiously as she goes to the outpatient     setting. 4. Anemia.  She experienced some borderline anemia here in the     hospital and her last CBC showed that hemoglobin was 9.9 and this     could of course be related to diverticular microbleed that she may     have.  The patient was doing well on day of discharge.  She had less abdominal pain.  She was not complaining of any significant nausea but still had some diarrhea which may be expected.  Temperature is 98.1, pulse is 71, respirations 19, blood pressure 121-129 over 63-72, satting 98% on room air.  Chest clinically clear.  S1, S2.  No murmur.  She has poor dentition.  Abdomen is soft, slightly tender in lower quadrant but decreased since prior examination one day prior.  No lower extremity edema.  The patient deemed fit for discharge and will be discharged home.  I have tried to contact son, Katheran Awe to let him know and available to speak with him if he has questions prior to discharge.          ______________________________ Pleas Koch, MD     JS/MEDQ  D:  07/12/2010  T:  07/12/2010  Job:  [id number]  Electronically Signed by Pleas Koch MD on 07/13/2010 11:29:26 AM

## 2010-07-27 ENCOUNTER — Ambulatory Visit: Payer: Medicare Other | Admitting: Internal Medicine

## 2010-10-13 LAB — COMPREHENSIVE METABOLIC PANEL
ALT: 16
AST: 21
CO2: 28
Chloride: 98
GFR calc Af Amer: >60
GFR calc non Af Amer: >60
Potassium: 3.3 — ABNORMAL LOW
Sodium: 132 — ABNORMAL LOW
Total Bilirubin: 0.7

## 2010-10-13 LAB — TROPONIN I
Troponin I: 0.01
Troponin I: 0.04

## 2010-10-13 LAB — BASIC METABOLIC PANEL
BUN: 4 — ABNORMAL LOW
Calcium: 8.3 — ABNORMAL LOW
Chloride: 105
Creatinine, Ser: 0.81
GFR calc Af Amer: >60

## 2010-10-13 LAB — CBC
MCHC: 33.1
MCHC: 33.8
MCV: 87.4
MCV: 87.6
Platelets: 195
RBC: 3.4 — ABNORMAL LOW
RBC: 3.6 — ABNORMAL LOW
RBC: 3.93
RDW: 13.5
WBC: 4.9
WBC: 5.5

## 2010-10-13 LAB — POCT I-STAT, CHEM 8
BUN: 7
Creatinine, Ser: 1
Glucose, Bld: 103 — ABNORMAL HIGH
Sodium: 121 — ABNORMAL LOW
TCO2: 31

## 2010-10-13 LAB — GLUCOSE, CAPILLARY
Glucose-Capillary: 115 — ABNORMAL HIGH
Glucose-Capillary: 117 — ABNORMAL HIGH
Glucose-Capillary: 88
Glucose-Capillary: 91
Glucose-Capillary: 91
Glucose-Capillary: 92

## 2010-10-13 LAB — DIFFERENTIAL
Basophils Relative: 1
Eosinophils Absolute: 0
Monocytes Absolute: 0.2
Monocytes Relative: 4
Neutrophils Relative %: 56

## 2010-10-13 LAB — CK TOTAL AND CKMB (NOT AT ARMC)
CK, MB: 1.1
CK, MB: 1.9
Relative Index: 0.7
Total CK: 180 — ABNORMAL HIGH

## 2011-10-11 ENCOUNTER — Encounter: Payer: Medicare Other | Admitting: Obstetrics and Gynecology

## 2011-10-25 ENCOUNTER — Ambulatory Visit (INDEPENDENT_AMBULATORY_CARE_PROVIDER_SITE_OTHER): Payer: Medicare Other | Admitting: Obstetrics and Gynecology

## 2011-10-25 ENCOUNTER — Encounter: Payer: Self-pay | Admitting: Obstetrics and Gynecology

## 2011-10-25 ENCOUNTER — Encounter: Payer: Medicare Other | Admitting: Obstetrics and Gynecology

## 2011-10-25 VITALS — BP 102/62 | Ht 63.0 in | Wt 167.0 lb

## 2011-10-25 DIAGNOSIS — N949 Unspecified condition associated with female genital organs and menstrual cycle: Secondary | ICD-10-CM

## 2011-10-25 DIAGNOSIS — Z124 Encounter for screening for malignant neoplasm of cervix: Secondary | ICD-10-CM

## 2011-10-25 DIAGNOSIS — R102 Pelvic and perineal pain: Secondary | ICD-10-CM

## 2011-10-25 NOTE — Progress Notes (Signed)
Pt concerned about mom with cervical cancer at 80 and she wants a pap smear.  Also c/o of her vagina hurting but it is really at labia kind of in the groin/crease. No visible lesions.  Filed Vitals:   10/25/11 1027  BP: 102/62   ROS: noncontributory  Pelvic exam:  VULVA: normal appearing vulva with no masses, tenderness or lesions,  VAGINA: normal appearing vagina with normal color and discharge, no lesions, atrophy CERVIX: normal appearing cervix without discharge or lesions, stenotic UTERUS: uterus is normal size, shape, consistency and nontender,  ADNEXA: normal adnexa in size, nontender and no masses.  A/P U/s at [facility] Pap today ? Adequacy secondary to stenotic (no h/o abnl pap per pt) ROI for labs from Dr. Allyne Gee

## 2011-10-26 LAB — PAP IG W/ RFLX HPV ASCU

## 2011-11-03 ENCOUNTER — Other Ambulatory Visit: Payer: Self-pay | Admitting: Obstetrics and Gynecology

## 2011-11-03 ENCOUNTER — Ambulatory Visit (INDEPENDENT_AMBULATORY_CARE_PROVIDER_SITE_OTHER): Payer: Medicare Other | Admitting: Obstetrics and Gynecology

## 2011-11-03 ENCOUNTER — Encounter: Payer: Self-pay | Admitting: Obstetrics and Gynecology

## 2011-11-03 ENCOUNTER — Ambulatory Visit (INDEPENDENT_AMBULATORY_CARE_PROVIDER_SITE_OTHER): Payer: Medicare Other

## 2011-11-03 VITALS — BP 138/70 | Resp 14 | Ht 63.0 in | Wt 164.0 lb

## 2011-11-03 DIAGNOSIS — R102 Pelvic and perineal pain unspecified side: Secondary | ICD-10-CM

## 2011-11-03 DIAGNOSIS — R1032 Left lower quadrant pain: Secondary | ICD-10-CM

## 2011-11-03 DIAGNOSIS — N949 Unspecified condition associated with female genital organs and menstrual cycle: Secondary | ICD-10-CM

## 2011-11-03 NOTE — Progress Notes (Signed)
Here for f/u u/s  Filed Vitals:   11/03/11 1634  BP: 138/70  Resp: 14   U/S - Ut surgically absent, no masses seen although ovaries are not specifically visualized Pap smear was neg CA-125 done by Dr Allyne Gee was wnl  A/P AEX in 28yr Pt feel reassured bc of her family's h/o cancer The pain she describes is quick almost sounds like nerve discomfort.  Pt instructed to bring up with PCP if persists.

## 2011-11-09 ENCOUNTER — Other Ambulatory Visit (HOSPITAL_COMMUNITY): Payer: Self-pay | Admitting: Internal Medicine

## 2011-11-09 DIAGNOSIS — Z1231 Encounter for screening mammogram for malignant neoplasm of breast: Secondary | ICD-10-CM

## 2011-11-26 ENCOUNTER — Ambulatory Visit (HOSPITAL_COMMUNITY)
Admission: RE | Admit: 2011-11-26 | Discharge: 2011-11-26 | Disposition: A | Discharge status: Home / Self Care | Payer: Medicare Other | Source: Ambulatory Visit | Attending: Internal Medicine | Admitting: Internal Medicine

## 2011-11-26 DIAGNOSIS — Z1231 Encounter for screening mammogram for malignant neoplasm of breast: Secondary | ICD-10-CM | POA: Insufficient documentation

## 2012-02-23 ENCOUNTER — Ambulatory Visit
Admission: RE | Admit: 2012-02-23 | Discharge: 2012-02-23 | Disposition: A | Discharge status: Home / Self Care | Payer: Medicare Other | Source: Ambulatory Visit | Attending: Internal Medicine | Admitting: Internal Medicine

## 2012-02-23 ENCOUNTER — Other Ambulatory Visit: Payer: Self-pay | Admitting: Internal Medicine

## 2012-02-23 DIAGNOSIS — R05 Cough: Secondary | ICD-10-CM

## 2012-11-12 ENCOUNTER — Emergency Department (HOSPITAL_COMMUNITY)
Admission: EM | Admit: 2012-11-12 | Discharge: 2012-11-12 | Disposition: A | Discharge status: Home / Self Care | Payer: Medicare Other | Source: Home / Self Care

## 2012-11-12 ENCOUNTER — Encounter (HOSPITAL_COMMUNITY): Payer: Self-pay | Admitting: Emergency Medicine

## 2012-11-12 DIAGNOSIS — S91109A Unspecified open wound of unspecified toe(s) without damage to nail, initial encounter: Secondary | ICD-10-CM

## 2012-11-12 DIAGNOSIS — S91209A Unspecified open wound of unspecified toe(s) with damage to nail, initial encounter: Secondary | ICD-10-CM

## 2012-11-12 MED ORDER — FLUCONAZOLE 150 MG PO TABS: 150.0000 mg | ORAL_TABLET | Freq: Once | ORAL | Status: DC

## 2012-11-12 MED ORDER — BACITRACIN-NEOMYCIN-POLYMYXIN 400-5-5000 EX OINT
TOPICAL_OINTMENT | Freq: Once | CUTANEOUS | Status: AC
  Administered 2012-11-12: 16:00:00 via TOPICAL

## 2012-11-12 MED ORDER — CEPHALEXIN 500 MG PO CAPS: 500.0000 mg | ORAL_CAPSULE | Freq: Four times a day (QID) | ORAL | Status: DC

## 2012-11-12 MED ORDER — MUPIROCIN 2 % EX OINT: TOPICAL_OINTMENT | Freq: Three times a day (TID) | CUTANEOUS | Status: DC

## 2012-11-12 MED ORDER — HYDROCODONE-ACETAMINOPHEN 5-325 MG PO TABS: 1.0000 | ORAL_TABLET | ORAL | Status: DC | PRN

## 2012-11-12 NOTE — ED Provider Notes (Signed)
Medical screening examination/treatment/procedure(s) were performed by non-physician practitioner and as supervising physician I was immediately available for consultation/collaboration.  Leslee Home, M.D.  Reuben Likes, MD 11/12/12 [phone]

## 2012-11-12 NOTE — ED Provider Notes (Signed)
CSN: [id number]     Arrival date & time 11/12/12  1306 History   First MD Initiated Contact with Patient 11/12/12 1419     Chief Complaint  Patient presents with  . Toe Injury   (Consider location/radiation/quality/duration/timing/severity/associated sxs/prior Treatment) HPI Comments: 72 year old female was rubbing motion of her toes this morning when her ring call in the edge of the right great toe nail and pulled it partially off. Patient had a history of onychomycosis involving the nail and it has been coming loose for several months. She has had it removed once but it did go back. This is her only injury.   Past Medical History  Diagnosis Date  . Type 2 diabetes mellitus   . Chronic headaches   . Hyperlipidemia    Past Surgical History  Procedure Laterality Date  . Vesicovaginal fistula closure w/ tah    . Tonsilectomy, adenoidectomy, bilateral myringotomy and tubes  1950  . Btl    . Bilateral tubal ligation  1971  . Partial hysterectomy  1973   Family History  Problem Relation Age of Onset  . Cervical cancer Mother   . Diabetes Father    History  Substance Use Topics  . Smoking status: Never Smoker   . Smokeless tobacco: Not on file  . Alcohol Use: No   OB History   Grav Para Term Preterm Abortions TAB SAB Ect Mult Living   7 5   2  2   5      Review of Systems  Skin:       No skin tears around the right great toe. The nail is loose at the lunula and appears the entire nail will  come off in one piece.  All other systems reviewed and are negative.    Allergies  Review of patient's allergies indicates no known allergies.  Home Medications   Current Outpatient Rx  Name  Route  Sig  Dispense  Refill  . acetaminophen-codeine (TYLENOL #3) 300-30 MG per tablet   Oral   Take 1 tablet by mouth Three times daily as needed.         Marland Kitchen alendronate (FOSAMAX) 70 MG tablet   Oral   Take 1 tablet by mouth Once a week.         Marland Kitchen aspirin 81 MG tablet   Oral  Take 81 mg by mouth daily.           . Calcium Carbonate-Vitamin D (CALTRATE 600+D) 600-400 MG-UNIT per tablet   Oral   Take 1 tablet by mouth daily.           . cephALEXin (KEFLEX) 500 MG capsule   Oral   Take 1 capsule (500 mg total) by mouth 4 (four) times daily.   20 capsule   0   . Cholecalciferol (VITAMIN D3) 2000 UNITS TABS   Oral   Take 1 tablet by mouth daily.           Marland Kitchen esomeprazole (NEXIUM) 40 MG capsule   Oral   Take 40 mg by mouth daily before breakfast.           . Flaxseed, Linseed, (FLAXSEED OIL) 1000 MG CAPS   Oral   Take 1 capsule by mouth daily.           . hydrochlorothiazide (,MICROZIDE/HYDRODIURIL,) 12.5 MG capsule   Oral   Take 1 capsule by mouth Daily.         Marland Kitchen HYDROcodone-acetaminophen (NORCO/VICODIN) 5-325 MG per tablet  Oral   Take 1 tablet by mouth every 4 (four) hours as needed for pain.   15 tablet   0   . JANUVIA 100 MG tablet   Oral   Take 1 tablet by mouth Daily.         Marland Kitchen loratadine (CLARITIN) 10 MG tablet   Oral   Take 10 mg by mouth daily.           Marland Kitchen losartan (COZAAR) 50 MG tablet   Oral   Take 1 tablet by mouth Daily.         . pioglitazone (ACTOS) 15 MG tablet   Oral   Take 15 mg by mouth daily.           . pravastatin (PRAVACHOL) 40 MG tablet   Oral   Take 1 tablet by mouth Daily.          BP 109/54  Pulse 80  Temp(Src) 97 F (36.1 C) (Oral)  Resp 18  SpO2 99% Physical Exam  Constitutional: She is oriented to person, place, and time. She appears well-developed and well-nourished. No distress.  HENT:  Head: Normocephalic and atraumatic.  Eyes: EOM are normal.  Neck: Normal range of motion. Neck supple.  Cardiovascular: Normal rate.   Pulmonary/Chest: Effort normal. No respiratory distress.  Musculoskeletal:  Tenderness to the right great toe nail. It is completely avulsed with the most proximal aspect attached.   Neurological: She is alert and oriented to person, place, and time. No  cranial nerve deficit.  Skin: Skin is warm and dry.  Psychiatric: She has a normal mood and affect.    ED Course  NAIL REMOVAL Date/Time: 11/12/2012 3:00 PM Performed by: Phineas Real, Keary Hanak Authorized by: Leslee Home C Consent: Verbal consent obtained. Risks and benefits: risks, benefits and alternatives were discussed Consent given by: patient Patient understanding: patient states understanding of the procedure being performed Patient identity confirmed: verbally with patient Location: left foot Location details: left big toe Anesthesia: digital block Local anesthetic: lidocaine 2% without epinephrine and bupivacaine 0.5% without epinephrine Anesthetic total: 7 ml Patient sedated: no Preparation: skin prepped with Betadine Amount removed: complete Wedge excision of skin of nail fold: no Nail bed sutured: no Nail matrix removed: complete Dressing: dressing applied Patient tolerance: Patient tolerated the procedure well with no immediate complications.   (including critical care time) Labs Review Labs Reviewed - No data to display Imaging Review No results found.    MDM   1. Nail avulsion, toe, initial encounter      The decision to remove the nail was made due to the  subungual hematoma and dirt that increases risk of infection. The nail would also be subjected to accidental movement and pulling  by touching shoes and other objects in would cause her necessary pain. Clean with soap and water daily. Cover with mupirocin oint bid and cover with large bandaid.   Soak in warm betadine water for 10 min; then apply neosporin oint and dressing. Keflex as prophylaxis Norco 5 as dir #15 F/U with PCP or here in 3 days if needed or signs of infection.   Hayden Rasmussen, NP 11/12/12 1530

## 2012-11-12 NOTE — ED Notes (Signed)
72 yr c/o Right Toe nail pain. Toe nail has been loose since March/April 2014. Pt states ring caught loose toe nail when lotioning her foot this morning.  PS: 8 - throbbing Headache since injury

## 2013-02-24 ENCOUNTER — Emergency Department (HOSPITAL_COMMUNITY): Payer: Medicare Other

## 2013-02-24 ENCOUNTER — Emergency Department (HOSPITAL_COMMUNITY)
Admission: EM | Admit: 2013-02-24 | Discharge: 2013-02-25 | Disposition: A | Discharge status: Home / Self Care | Payer: Medicare Other | Attending: Emergency Medicine | Admitting: Emergency Medicine

## 2013-02-24 ENCOUNTER — Encounter (HOSPITAL_COMMUNITY): Payer: Self-pay | Admitting: Emergency Medicine

## 2013-02-24 DIAGNOSIS — R1084 Generalized abdominal pain: Secondary | ICD-10-CM | POA: Insufficient documentation

## 2013-02-24 DIAGNOSIS — R197 Diarrhea, unspecified: Secondary | ICD-10-CM | POA: Insufficient documentation

## 2013-02-24 DIAGNOSIS — R109 Unspecified abdominal pain: Secondary | ICD-10-CM

## 2013-02-24 DIAGNOSIS — R51 Headache: Secondary | ICD-10-CM | POA: Insufficient documentation

## 2013-02-24 DIAGNOSIS — E785 Hyperlipidemia, unspecified: Secondary | ICD-10-CM | POA: Insufficient documentation

## 2013-02-24 DIAGNOSIS — Z7982 Long term (current) use of aspirin: Secondary | ICD-10-CM | POA: Insufficient documentation

## 2013-02-24 DIAGNOSIS — E119 Type 2 diabetes mellitus without complications: Secondary | ICD-10-CM | POA: Insufficient documentation

## 2013-02-24 DIAGNOSIS — R110 Nausea: Secondary | ICD-10-CM | POA: Insufficient documentation

## 2013-02-24 DIAGNOSIS — G8929 Other chronic pain: Secondary | ICD-10-CM | POA: Insufficient documentation

## 2013-02-24 DIAGNOSIS — Z79899 Other long term (current) drug therapy: Secondary | ICD-10-CM | POA: Insufficient documentation

## 2013-02-24 LAB — COMPREHENSIVE METABOLIC PANEL
ALBUMIN: 3.7 g/dL (ref 3.5–5.2)
ALT: 15 U/L (ref 0–35)
AST: 23 U/L (ref 0–37)
Alkaline Phosphatase: 62 U/L (ref 39–117)
BILIRUBIN TOTAL: 0.2 mg/dL — AB (ref 0.3–1.2)
BUN: 9 mg/dL (ref 6–23)
CO2: 26 mEq/L (ref 19–32)
CREATININE: 0.94 mg/dL (ref 0.50–1.10)
Calcium: 9.4 mg/dL (ref 8.4–10.5)
Chloride: 91 mEq/L — ABNORMAL LOW (ref 96–112)
GFR calc Af Amer: 68 mL/min — ABNORMAL LOW (ref >90)
GFR calc non Af Amer: 58 mL/min — ABNORMAL LOW (ref >90)
Glucose, Bld: 116 mg/dL — ABNORMAL HIGH (ref 70–99)
Potassium: 3.6 mEq/L — ABNORMAL LOW (ref 3.7–5.3)
Sodium: 132 mEq/L — ABNORMAL LOW (ref 137–147)
Total Protein: 7.3 g/dL (ref 6.0–8.3)

## 2013-02-24 LAB — CBC WITH DIFFERENTIAL/PLATELET
BASOS PCT: 0 % (ref 0–1)
Basophils Absolute: 0 10*3/uL (ref 0.0–0.1)
Eosinophils Absolute: 0 10*3/uL (ref 0.0–0.7)
Eosinophils Relative: 0 % (ref 0–5)
HEMATOCRIT: 36.2 % (ref 36.0–46.0)
HEMOGLOBIN: 12.3 g/dL (ref 12.0–15.0)
Lymphocytes Relative: 17 % (ref 12–46)
Lymphs Abs: 0.9 10*3/uL (ref 0.7–4.0)
MCH: 28.5 pg (ref 26.0–34.0)
MCHC: 34 g/dL (ref 30.0–36.0)
MCV: 84 fL (ref 78.0–100.0)
MONO ABS: 0.2 10*3/uL (ref 0.1–1.0)
Monocytes Relative: 4 % (ref 3–12)
Neutro Abs: 4.4 10*3/uL (ref 1.7–7.7)
Neutrophils Relative %: 79 % — ABNORMAL HIGH (ref 43–77)
Platelets: 246 10*3/uL (ref 150–400)
RBC: 4.31 MIL/uL (ref 3.87–5.11)
RDW: 14.1 % (ref 11.5–15.5)
WBC: 5.6 10*3/uL (ref 4.0–10.5)

## 2013-02-24 LAB — LIPASE, BLOOD: LIPASE: 23 U/L (ref 11–59)

## 2013-02-24 LAB — LACTIC ACID, PLASMA: LACTIC ACID, VENOUS: 3.3 mmol/L — AB (ref 0.5–2.2)

## 2013-02-24 MED ORDER — IOHEXOL 300 MG/ML  SOLN
100.0000 mL | Freq: Once | INTRAMUSCULAR | Status: AC | PRN
  Administered 2013-02-24: 100 mL via INTRAVENOUS

## 2013-02-24 MED ORDER — SODIUM CHLORIDE 0.9 % IV BOLUS (SEPSIS)
1000.0000 mL | Freq: Once | INTRAVENOUS | Status: AC
  Administered 2013-02-24: 1000 mL via INTRAVENOUS

## 2013-02-24 MED ORDER — ACETAMINOPHEN 500 MG PO TABS
1000.0000 mg | ORAL_TABLET | Freq: Once | ORAL | Status: DC
  Filled 2013-02-24: qty 2

## 2013-02-24 MED ORDER — MORPHINE SULFATE 4 MG/ML IJ SOLN
4.0000 mg | Freq: Once | INTRAMUSCULAR | Status: AC
  Administered 2013-02-24: 4 mg via INTRAVENOUS
  Filled 2013-02-24: qty 1

## 2013-02-24 MED ORDER — ONDANSETRON HCL 4 MG/2ML IJ SOLN
4.0000 mg | Freq: Once | INTRAMUSCULAR | Status: AC
  Administered 2013-02-24: 4 mg via INTRAVENOUS
  Filled 2013-02-24: qty 2

## 2013-02-24 NOTE — ED Provider Notes (Signed)
CSN: [id number]     Arrival date & time 02/24/13  2011 History   First MD Initiated Contact with Patient 02/24/13 2104     Chief Complaint  Patient presents with  . Headache  . Abdominal Pain     (Consider location/radiation/quality/duration/timing/severity/associated sxs/prior Treatment) Patient is a 73 y.o. female presenting with abdominal pain.  Abdominal Pain Pain location:  Generalized Pain quality: aching   Pain radiates to:  Does not radiate Pain severity:  Severe Onset quality:  Gradual Duration:  1 day Timing:  Constant Progression:  Worsening Chronicity:  Recurrent (similar episode 3 years ago diagnosed as diverticulitis) Context comment:  Woke up this morning feeling well, but soon after developed symptoms. Relieved by:  Nothing Ineffective treatments: fiorcet. Associated symptoms: diarrhea (multiple episodes today) and nausea   Associated symptoms: no chest pain, no cough, no dysuria, no fever, no shortness of breath and no vomiting   Diarrhea:    Diarrhea characteristics: non bloody.   Past Medical History  Diagnosis Date  . Type 2 diabetes mellitus   . Chronic headaches   . Hyperlipidemia    Past Surgical History  Procedure Laterality Date  . Vesicovaginal fistula closure w/ tah    . Tonsilectomy, adenoidectomy, bilateral myringotomy and tubes  1950  . Btl    . Bilateral tubal ligation  1971  . Partial hysterectomy  1973   Family History  Problem Relation Age of Onset  . Cervical cancer Mother   . Diabetes Father    History  Substance Use Topics  . Smoking status: Never Smoker   . Smokeless tobacco: Not on file  . Alcohol Use: Yes     Comment: occasionally    OB History   Grav Para Term Preterm Abortions TAB SAB Ect Mult Living   7 5   2  2   5      Review of Systems  Constitutional: Negative for fever.  HENT: Negative for congestion.   Respiratory: Negative for cough and shortness of breath.   Cardiovascular: Negative for chest pain.   Gastrointestinal: Positive for nausea, abdominal pain and diarrhea (multiple episodes today). Negative for vomiting.  Genitourinary: Negative for dysuria.  All other systems reviewed and are negative.      Allergies  Review of patient's allergies indicates no known allergies.  Home Medications   Current Outpatient Rx  Name  Route  Sig  Dispense  Refill  . acetaminophen-codeine (TYLENOL #3) 300-30 MG per tablet   Oral   Take 1 tablet by mouth Three times daily as needed.         Marland Kitchen alendronate (FOSAMAX) 70 MG tablet   Oral   Take 1 tablet by mouth Once a week.         Marland Kitchen aspirin 81 MG tablet   Oral   Take 81 mg by mouth daily.           . Calcium Carbonate-Vitamin D (CALTRATE 600+D) 600-400 MG-UNIT per tablet   Oral   Take 1 tablet by mouth daily.           . cephALEXin (KEFLEX) 500 MG capsule   Oral   Take 1 capsule (500 mg total) by mouth 4 (four) times daily.   20 capsule   0   . Cholecalciferol (VITAMIN D3) 2000 UNITS TABS   Oral   Take 1 tablet by mouth daily.           Marland Kitchen esomeprazole (NEXIUM) 40 MG capsule   Oral  Take 40 mg by mouth daily before breakfast.           . Flaxseed, Linseed, (FLAXSEED OIL) 1000 MG CAPS   Oral   Take 1 capsule by mouth daily.           . fluconazole (DIFLUCAN) 150 MG tablet   Oral   Take 1 tablet (150 mg total) by mouth once.   1 tablet   0   . hydrochlorothiazide (,MICROZIDE/HYDRODIURIL,) 12.5 MG capsule   Oral   Take 1 capsule by mouth Daily.         Marland Kitchen. HYDROcodone-acetaminophen (NORCO/VICODIN) 5-325 MG per tablet   Oral   Take 1 tablet by mouth every 4 (four) hours as needed for pain.   15 tablet   0   . JANUVIA 100 MG tablet   Oral   Take 1 tablet by mouth Daily.         Marland Kitchen. loratadine (CLARITIN) 10 MG tablet   Oral   Take 10 mg by mouth daily.           Marland Kitchen. losartan (COZAAR) 50 MG tablet   Oral   Take 1 tablet by mouth Daily.         . mupirocin ointment (BACTROBAN) 2 %   Topical    Apply topically 3 (three) times daily.   22 g   0   . pioglitazone (ACTOS) 15 MG tablet   Oral   Take 15 mg by mouth daily.           . pravastatin (PRAVACHOL) 40 MG tablet   Oral   Take 1 tablet by mouth Daily.          BP 119/60  Pulse 82  Temp(Src) 98.3 F (36.8 C) (Oral)  Resp 18  SpO2 98% Physical Exam  Nursing note and vitals reviewed. Constitutional: She is oriented to person, place, and time. She appears well-developed and well-nourished. No distress.  HENT:  Head: Normocephalic and atraumatic.  Mouth/Throat: Oropharynx is clear and moist.  Eyes: Conjunctivae are normal. Pupils are equal, round, and reactive to light. No scleral icterus.  Neck: Neck supple.  Cardiovascular: Normal rate, regular rhythm, normal heart sounds and intact distal pulses.   No murmur heard. Pulmonary/Chest: Effort normal and breath sounds normal. No stridor. No respiratory distress. She has no rales.  Abdominal: Soft. Bowel sounds are normal. She exhibits no distension. There is generalized tenderness. There is no rigidity, no rebound and no guarding.  Musculoskeletal: Normal range of motion.  Neurological: She is alert and oriented to person, place, and time.  Skin: Skin is warm and dry. No rash noted.  Psychiatric: She has a normal mood and affect. Her behavior is normal.    ED Course  Procedures (including critical care time) Labs Review Labs Reviewed  CBC WITH DIFFERENTIAL - Abnormal; Notable for the following:    Neutrophils Relative % 79 (*)    All other components within normal limits  COMPREHENSIVE METABOLIC PANEL - Abnormal; Notable for the following:    Sodium 132 (*)    Potassium 3.6 (*)    Chloride 91 (*)    Glucose, Bld 116 (*)    Total Bilirubin 0.2 (*)    GFR calc non Af Amer 58 (*)    GFR calc Af Amer 68 (*)    All other components within normal limits  LACTIC ACID, PLASMA - Abnormal; Notable for the following:    Lactic Acid, Venous 3.3 (*)    All  other  components within normal limits  LIPASE, BLOOD  URINALYSIS, ROUTINE W REFLEX MICROSCOPIC  CG4 I-STAT (LACTIC ACID)   Imaging Review Ct Abdomen Pelvis W Contrast  02/24/2013   CLINICAL DATA:  Vomiting, diarrhea, prior history of diverticulitis  EXAM: CT ABDOMEN AND PELVIS WITH CONTRAST  TECHNIQUE: Multidetector CT imaging of the abdomen and pelvis was performed using the standard protocol following bolus administration of intravenous contrast.  CONTRAST:  OMNIPAQUE IOHEXOL 300 MG/ML  SOLN  COMPARISON:  Prior CT abdomen/ pelvis 07/07/2010  FINDINGS: Lower Chest: The lung bases are clear. Visualized cardiac structures are within normal limits for size. No pericardial effusion. Unremarkable visualized distal thoracic esophagus.  Abdomen: Unremarkable CT appearance of the stomach, duodenum, spleen, adrenal glands, pancreas and liver. Gallbladder is unremarkable. No intra or extrahepatic biliary ductal dilatation.  Unremarkable appearance of the bilateral kidneys. No focal solid lesion, hydronephrosis or nephrolithiasis. Stable tiny cyst in the lower pole of the right kidney dating back to 2012.  Normal appendix in the right lower quadrant. There are a few scattered colonic diverticula but no evidence of active inflammation. Nonspecific mild dilatation of loops of small bowel in the left mid abdomen without evidence of obstruction or bowel wall thickening. No free fluid or suspicious adenopathy.  Pelvis: Surgical changes of prior hysterectomy. Otherwise, unremarkable female pelvis.  Bones/Soft Tissues: No acute fracture or aggressive appearing lytic or blastic osseous lesion. Lower lumbar facet arthropathy.  Vascular: Atherosclerotic vascular disease without significant stenosis or aneurysmal dilatation.  IMPRESSION: 1. Nonspecific appearance of the bowel with several loops of borderline dilated and fluid-filled small bowel without evidence of true obstruction, inflammation or wall thickening. Differential  considerations include active peristalsis panel gastroenteritis. 2. Scattered colonic diverticula without evidence of active inflammation. 3. Surgical changes of prior hysterectomy.   Electronically Signed   By: Malachy Moan M.D.   On: 02/24/2013 23:21  All radiology studies independently viewed by me.     EKG Interpretation   None       MDM   Final diagnoses:  Diarrhea  Abdominal pain    73 yo female with crampy abdominal pain and watery diarrhea starting today.  Nausea, but no vomiting.  Abdominal exam shows soft abdomen with mild diffuse tenderness.  However, she states that she always has abdominal tenderness.  She is unsure if her tenderness is worse than usual.  Nontoxic appearance.  Labwork showed mild lactic acidosis which cleared with IV fluids.  CT consistent with gastroenteritis, as are her symptoms.  No evidence of diverticulitis or colitis.  She has tolerated PO fluids.  Plan DC home with return precautions.      Candyce Churn III, MD 02/25/13 [phone]

## 2013-02-24 NOTE — ED Notes (Signed)
MD at bedside. 

## 2013-02-24 NOTE — ED Notes (Signed)
Patient transported to CT 

## 2013-02-24 NOTE — ED Notes (Signed)
Pt presents with c/o headache and abdominal pain. Pt says that her symptoms started this morning. Pt says she is nauseated but no vomiting but has had diarrhea all day long, approx 7-8 times. Pt says she has a hx of headaches and a hx of diverticulitis.

## 2013-02-25 LAB — CG4 I-STAT (LACTIC ACID): Lactic Acid, Venous: 1 mmol/L (ref 0.5–2.2)

## 2013-02-25 MED ORDER — ONDANSETRON HCL 4 MG PO TABS: 4.0000 mg | ORAL_TABLET | Freq: Four times a day (QID) | ORAL | Status: DC

## 2013-02-25 NOTE — Discharge Instructions (Signed)
Abdominal Pain, Adult °Many things can cause abdominal pain. Usually, abdominal pain is not caused by a disease and will improve without treatment. It can often be observed and treated at home. Your health care provider will do a physical exam and possibly order blood tests and X-rays to help determine the seriousness of your pain. However, in many cases, more time must pass before a clear cause of the pain can be found. Before that point, your health care provider may not know if you need more testing or further treatment. °HOME CARE INSTRUCTIONS  °Monitor your abdominal pain for any changes. The following actions may help to alleviate any discomfort you are experiencing: °· Only take over-the-counter or prescription medicines as directed by your health care provider. °· Do not take laxatives unless directed to do so by your health care provider. °· Try a clear liquid diet (broth, tea, or water) as directed by your health care provider. Slowly move to a bland diet as tolerated. °SEEK MEDICAL CARE IF: °· You have unexplained abdominal pain. °· You have abdominal pain associated with nausea or diarrhea. °· You have pain when you urinate or have a bowel movement. °· You experience abdominal pain that wakes you in the night. °· You have abdominal pain that is worsened or improved by eating food. °· You have abdominal pain that is worsened with eating fatty foods. °SEEK IMMEDIATE MEDICAL CARE IF:  °· Your pain does not go away within 2 hours. °· You have a fever. °· You keep throwing up (vomiting). °· Your pain is felt only in portions of the abdomen, such as the right side or the left lower portion of the abdomen. °· You pass bloody or black tarry stools. °MAKE SURE YOU: °· Understand these instructions.   °· Will watch your condition.   °· Will get help right away if you are not doing well or get worse.   °Document Released: 10/07/2004 Document Revised: 10/18/2012 Document Reviewed: 09/06/2012 °ExitCare® Patient  Information ©2014 [employer]. °Diarrhea °Diarrhea is frequent loose and watery bowel movements. It can cause you to feel weak and dehydrated. Dehydration can cause you to become tired and thirsty, have a dry mouth, and have decreased urination that often is dark yellow. Diarrhea is a sign of another problem, most often an infection that will not last long. In most cases, diarrhea typically lasts 2 3 days. However, it can last longer if it is a sign of something more serious. It is important to treat your diarrhea as directed by your caregive to lessen or prevent future episodes of diarrhea. °CAUSES  °Some common causes include: °· Gastrointestinal infections caused by viruses, bacteria, or parasites. °· Food poisoning or food allergies. °· Certain medicines, such as antibiotics, chemotherapy, and laxatives. °· Artificial sweeteners and fructose. °· Digestive disorders. °HOME CARE INSTRUCTIONS °· Ensure adequate fluid intake (hydration): have 1 cup (8 oz) of fluid for each diarrhea episode. Avoid fluids that contain simple sugars or sports drinks, fruit juices, whole milk products, and sodas. Your urine should be clear or pale yellow if you are drinking enough fluids. Hydrate with an oral rehydration solution that you can purchase at pharmacies, retail stores, and online. You can prepare an oral rehydration solution at home by mixing the following ingredients together: °·   tsp table salt. °· ¾ tsp baking soda. °·  tsp salt substitute containing potassium chloride. °· 1  tablespoons sugar. °· 1 L (34 oz) of water. °· Certain foods and beverages may increase the speed   at which food moves through the gastrointestinal (GI) tract. These foods and beverages should be avoided and include: °· Caffeinated and alcoholic beverages. °· High-fiber foods, such as raw fruits and vegetables, nuts, seeds, and whole grain breads and cereals. °· Foods and beverages sweetened with sugar alcohols, such as xylitol, sorbitol, and  mannitol. °· Some foods may be well tolerated and may help thicken stool including: °· Starchy foods, such as rice, toast, pasta, low-sugar cereal, oatmeal, grits, baked potatoes, crackers, and bagels. °· Bananas. °· Applesauce. °· Add probiotic-rich foods to help increase healthy bacteria in the GI tract, such as yogurt and fermented milk products. °· Wash your hands well after each diarrhea episode. °· Only take over-the-counter or prescription medicines as directed by your caregiver. °· Take a warm bath to relieve any burning or pain from frequent diarrhea episodes. °SEEK IMMEDIATE MEDICAL CARE IF:  °· You are unable to keep fluids down. °· You have persistent vomiting. °· You have blood in your stool, or your stools are black and tarry. °· You do not urinate in 6 8 hours, or there is only a small amount of very dark urine. °· You have abdominal pain that increases or localizes. °· You have weakness, dizziness, confusion, or lightheadedness. °· You have a severe headache. °· Your diarrhea gets worse or does not get better. °· You have a fever or persistent symptoms for more than 2 3 days. °· You have a fever and your symptoms suddenly get worse. °MAKE SURE YOU:  °· Understand these instructions. °· Will watch your condition. °· Will get help right away if you are not doing well or get worse. °Document Released: 12/18/2001 Document Revised: 12/15/2011 Document Reviewed: 09/05/2011 °ExitCare® Patient Information ©2014 [employer]. ° °

## 2013-05-04 ENCOUNTER — Other Ambulatory Visit (HOSPITAL_COMMUNITY): Payer: Self-pay | Admitting: Internal Medicine

## 2013-05-04 ENCOUNTER — Other Ambulatory Visit: Payer: Self-pay

## 2013-05-04 DIAGNOSIS — Z1231 Encounter for screening mammogram for malignant neoplasm of breast: Secondary | ICD-10-CM

## 2013-05-08 ENCOUNTER — Ambulatory Visit (HOSPITAL_COMMUNITY)
Admission: RE | Admit: 2013-05-08 | Discharge: 2013-05-08 | Disposition: A | Discharge status: Home / Self Care | Payer: Medicare Other | Source: Ambulatory Visit | Attending: Internal Medicine | Admitting: Internal Medicine

## 2013-05-08 ENCOUNTER — Encounter (INDEPENDENT_AMBULATORY_CARE_PROVIDER_SITE_OTHER): Payer: Self-pay

## 2013-05-08 DIAGNOSIS — Z1231 Encounter for screening mammogram for malignant neoplasm of breast: Secondary | ICD-10-CM | POA: Insufficient documentation

## 2013-05-18 ENCOUNTER — Ambulatory Visit: Payer: Medicare Other

## 2013-07-08 ENCOUNTER — Emergency Department (HOSPITAL_COMMUNITY)
Admission: EM | Admit: 2013-07-08 | Discharge: 2013-07-08 | Disposition: A | Discharge status: Home / Self Care | Payer: Medicare Other | Source: Home / Self Care | Attending: Emergency Medicine | Admitting: Emergency Medicine

## 2013-07-08 ENCOUNTER — Encounter (HOSPITAL_COMMUNITY): Payer: Self-pay | Admitting: Emergency Medicine

## 2013-07-08 DIAGNOSIS — J029 Acute pharyngitis, unspecified: Secondary | ICD-10-CM

## 2013-07-08 DIAGNOSIS — R58 Hemorrhage, not elsewhere classified: Secondary | ICD-10-CM

## 2013-07-08 DIAGNOSIS — I998 Other disorder of circulatory system: Secondary | ICD-10-CM

## 2013-07-08 LAB — CBC WITH DIFFERENTIAL/PLATELET
BASOS ABS: 0 10*3/uL (ref 0.0–0.1)
BASOS PCT: 0 % (ref 0–1)
Eosinophils Absolute: 0.1 10*3/uL (ref 0.0–0.7)
Eosinophils Relative: 2 % (ref 0–5)
HCT: 38.9 % (ref 36.0–46.0)
Hemoglobin: 12.8 g/dL (ref 12.0–15.0)
LYMPHS PCT: 47 % — AB (ref 12–46)
Lymphs Abs: 3 10*3/uL (ref 0.7–4.0)
MCH: 28.5 pg (ref 26.0–34.0)
MCHC: 32.9 g/dL (ref 30.0–36.0)
MCV: 86.6 fL (ref 78.0–100.0)
Monocytes Absolute: 0.4 10*3/uL (ref 0.1–1.0)
Monocytes Relative: 6 % (ref 3–12)
Neutro Abs: 2.9 10*3/uL (ref 1.7–7.7)
Neutrophils Relative %: 45 % (ref 43–77)
PLATELETS: 258 10*3/uL (ref 150–400)
RBC: 4.49 MIL/uL (ref 3.87–5.11)
RDW: 14.4 % (ref 11.5–15.5)
WBC: 6.4 10*3/uL (ref 4.0–10.5)

## 2013-07-08 LAB — POCT RAPID STREP A: Streptococcus, Group A Screen (Direct): NEGATIVE

## 2013-07-08 MED ORDER — FLUTICASONE PROPIONATE 50 MCG/ACT NA SUSP: 1.0000 | Freq: Every day | NASAL | Status: AC

## 2013-07-08 NOTE — ED Provider Notes (Signed)
CSN: [id number]     Arrival date & time 07/08/13  1240 History   First MD Initiated Contact with Patient 07/08/13 1444     Chief Complaint  Patient presents with  . Sore Throat  . Headache   (Consider location/radiation/quality/duration/timing/severity/associated sxs/prior Treatment) HPI   Symptoms Cough: yes productive no Runny Nose: no Sore Throat: yes - tenderness under her jaw Sinus Pressure: no Shortness of Breath: no Fever/Chills: yes Nausea/Vomiting; no Diarrhea: no  Course: patient states that 3 weeks ago she had similar symptoms and was diagnosed with a sinus infection. At this time she was prescribed azithromycin. She took a 5 day course. She had improvement. She notes that she started to get a sore throat again 2-3 days ago. It is associated with a cough that is worse at night. Treatments Tried: cough syrup Exacerbating: nighttime   Past Medical History  Diagnosis Date  . Type 2 diabetes mellitus   . Chronic headaches   . Hyperlipidemia    Past Surgical History  Procedure Laterality Date  . Vesicovaginal fistula closure w/ tah    . Tonsilectomy, adenoidectomy, bilateral myringotomy and tubes  1950  . Btl    . Bilateral tubal ligation  1971  . Partial hysterectomy  1973   Family History  Problem Relation Age of Onset  . Cervical cancer Mother   . Diabetes Father    History  Substance Use Topics  . Smoking status: Never Smoker   . Smokeless tobacco: Not on file  . Alcohol Use: Yes     Comment: occasionally    OB History   Grav Para Term Preterm Abortions TAB SAB Ect Mult Living   7 5   2  2   5      Review of Systems Positive for sore throat and cough, night sweats Negative for fever, chills, nausea, vomiting, , chest pain, dyspnea Allergies  Review of patient's allergies indicates no known allergies.  Home Medications   Prior to Admission medications   Medication Sig Start Date End Date Taking? Authorizing Provider  aspirin 81 MG tablet  Take 81 mg by mouth every evening.     Historical Provider, MD  Butalbital-APAP-Caffeine (FIORICET) 50-300-40 MG CAPS Take 1-2 capsules by mouth every 4 (four) hours as needed (for pain.). Do not exceed 6 capsules daily    Historical Provider, MD  Cholecalciferol (VITAMIN D3) 2000 UNITS TABS Take 1 tablet by mouth daily.      Historical Provider, MD  esomeprazole (NEXIUM) 40 MG capsule Take 40 mg by mouth daily before breakfast.      Historical Provider, MD  Flaxseed, Linseed, (FLAXSEED OIL) 1000 MG CAPS Take 1 capsule by mouth daily.      Historical Provider, MD  hydrochlorothiazide (,MICROZIDE/HYDRODIURIL,) 12.5 MG capsule Take 1 capsule by mouth Daily. 06/04/10   Historical Provider, MD  JANUVIA 100 MG tablet Take 1 tablet by mouth Daily. 05/05/10   Historical Provider, MD  losartan (COZAAR) 50 MG tablet Take 1 tablet by mouth Daily. 06/21/10   Historical Provider, MD  Multiple Vitamins-Minerals (ALIVE ONCE DAILY WOMENS 50+ PO) Take 1 tablet by mouth daily.    Historical Provider, MD  ondansetron (ZOFRAN) 4 MG tablet Take 1 tablet (4 mg total) by mouth every 6 (six) hours. 02/25/13   Candyce ChurnJohn David Wofford III, MD  pioglitazone (ACTOS) 15 MG tablet Take 15 mg by mouth every evening.     Historical Provider, MD  pravastatin (PRAVACHOL) 40 MG tablet Take 1 tablet by mouth Daily.  06/11/10   Historical Provider, MD  Probiotic Product (ALIGN) 4 MG CAPS Take 4 mg by mouth daily.    Historical Provider, MD  vitamin B-12 (CYANOCOBALAMIN) 1000 MCG tablet Take 1,000 mcg by mouth daily.    Historical Provider, MD   BP 117/80  Pulse 62  Temp(Src) 99 F (37.2 C) (Oral)  Resp 18  SpO2 100% Physical Exam Gen: elderly AA female, well appearing, NAD, pleasant and conversant HEENT: NCAT, PERRLA, EOMI, OP clear and moist, no oropharyngeal exudate, no lymphadenopathy, no thyroid tenderness, enlargement, or nodules, neck with normal ROM, no meningismus, TM reflective without effusion  CV: RRR, no m/r/g, no JVD or  carotid bruits Pulm: normal WOB, CTA-B Abd: soft, NDNT, NABS Extremities: large ecchymosis over right upper extremity; no lymphadenopathy Skin: warm, dry, no rashes Neuro/Psych: A&Ox4, normal affect, speech, and thought content  ED Course  Procedures (including critical care time) Labs Review Labs Reviewed  CBC WITH DIFFERENTIAL  POCT RAPID STREP A ([facility]RG CARE ONLY)    Imaging Review No results found.   MDM   1. Pharyngitis   2. Ecchymosis    Broad differential including viral URI, allergies, and also concerning for malignancy since she is also having bruising, decreased appetite, and night sweats. I will check a CBC with diff today to eval for hematologic malignancy. Also, we will try fluticasone for possible allergic component.     Garnetta BuddyEdward Williamson V, MD 07/08/13 [phone]

## 2013-07-08 NOTE — ED Notes (Signed)
Patient c/o sore throat onset Thursday. Patient reports she had a recent sinus infection and was treated. Patient also reports she has a headache. Has a hx of frequent headaches. Patient is alert and oriented and in no acute distress.

## 2013-07-08 NOTE — Discharge Instructions (Signed)
[name],   I do not see any evidence of a bacterial infection at this time. The sore throat may be caused by allergies or a viral infection. However, the combination of the night sweats, bruising, and decreased appetite has me concerned about a possible problem with your blood. Therefore, I am going to check your blood labs today.   You should follow up with Vickie Brown to go over the labs.   I hope you feel better soon.   Dr. Clinton Brown

## 2013-07-10 LAB — CULTURE, GROUP A STREP

## 2013-07-10 NOTE — ED Provider Notes (Signed)
Medical screening examination/treatment/procedure(s) were performed by a resident physician and as supervising physician I was immediately available for consultation/collaboration.  Leslee Homeavid Keller, M.D.  Reuben Likesavid C Keller, MD 07/10/13 [phone]

## 2013-11-12 ENCOUNTER — Encounter (HOSPITAL_COMMUNITY): Payer: Self-pay | Admitting: Emergency Medicine

## 2013-11-20 ENCOUNTER — Ambulatory Visit
Admission: RE | Admit: 2013-11-20 | Discharge: 2013-11-20 | Disposition: A | Discharge status: Home / Self Care | Payer: Medicare Other | Source: Ambulatory Visit | Attending: Family | Admitting: Family

## 2013-11-20 ENCOUNTER — Other Ambulatory Visit: Payer: Self-pay | Admitting: Family

## 2013-11-20 DIAGNOSIS — R05 Cough: Secondary | ICD-10-CM

## 2013-11-20 DIAGNOSIS — R059 Cough, unspecified: Secondary | ICD-10-CM

## 2014-01-25 ENCOUNTER — Institutional Professional Consult (permissible substitution): Payer: Medicare Other | Admitting: Internal Medicine

## 2014-04-17 ENCOUNTER — Other Ambulatory Visit (HOSPITAL_COMMUNITY): Payer: Self-pay | Admitting: Internal Medicine

## 2014-04-17 DIAGNOSIS — Z1231 Encounter for screening mammogram for malignant neoplasm of breast: Secondary | ICD-10-CM

## 2014-05-13 ENCOUNTER — Ambulatory Visit (HOSPITAL_COMMUNITY)
Admission: RE | Admit: 2014-05-13 | Discharge: 2014-05-13 | Disposition: A | Discharge status: Home / Self Care | Payer: Medicare Other | Source: Ambulatory Visit | Attending: Internal Medicine | Admitting: Internal Medicine

## 2014-05-13 DIAGNOSIS — Z1231 Encounter for screening mammogram for malignant neoplasm of breast: Secondary | ICD-10-CM | POA: Insufficient documentation

## 2014-11-13 ENCOUNTER — Encounter: Payer: Self-pay | Admitting: *Deleted

## 2014-11-15 ENCOUNTER — Encounter: Payer: Self-pay | Admitting: Diagnostic Neuroimaging

## 2014-11-15 ENCOUNTER — Ambulatory Visit (INDEPENDENT_AMBULATORY_CARE_PROVIDER_SITE_OTHER): Payer: Medicare Other | Admitting: Diagnostic Neuroimaging

## 2014-11-15 VITALS — BP 99/57 | HR 57 | Resp 20 | Ht 63.0 in | Wt 149.0 lb

## 2014-11-15 DIAGNOSIS — G4452 New daily persistent headache (NDPH): Secondary | ICD-10-CM

## 2014-11-15 NOTE — Patient Instructions (Signed)
Thank you for coming to see [place] at [facility]. I hope we have been able to provide you high quality care today.  You may receive a patient satisfaction survey over the next few weeks. We would appreciate your feedback and comments so that we may continue to improve ourselves and the health of our patients.  - try over the counter tylenol (acetaminophen) as needed for mild headaches - I will check MRI brain and lab testing   ~~~~~~~~~~~~~~~~~~~~~~~~~~~~~~~~~~~~~~~~~~~~~~~~~~~~~~~~~~~~~~~~~  DR. Madalynne Gutmann'S GUIDE TO HAPPY AND HEALTHY LIVING These are some of my general health and wellness recommendations. Some of them may apply to you better than others. Please use common sense as you try these suggestions and feel free to ask me any questions.   ACTIVITY/FITNESS Mental, social, emotional and physical stimulation are very important for brain and body health. Try learning a new activity (arts, music, language, sports, games).  Keep moving your body to the best of your abilities. You can do this at home, inside or outside, the park, community center, gym or anywhere you like. Consider a physical therapist or personal trainer to get started. Consider the app Sworkit. Fitness trackers such as smart-watches, smart-phones or Fitbits can help as well.   NUTRITION Eat more plants: colorful vegetables, nuts, seeds and berries.  Eat less sugar, salt, preservatives and processed foods.  Avoid toxins such as cigarettes and alcohol.  Drink water when you are thirsty. Warm water with a slice of lemon is an excellent morning drink to start the day.  Consider these websites for more information The Nutrition Source (https://www.henry-hernandez.biz/) [employer] Nutrition (WindowBlog.ch)   RELAXATION Consider practicing mindfulness meditation or other relaxation techniques such as deep breathing, prayer, yoga, tai chi, massage. See website  mindful.org or the apps Headspace or Calm to help get started.   SLEEP Try to get at least 7-8+ hours sleep per day. Regular exercise and reduced caffeine will help you sleep better. Practice good sleep hygeine techniques. See website sleep.org for more information.   PLANNING Prepare estate planning, living will, healthcare POA documents. Sometimes this is best planned with the help of an attorney. Theconversationproject.org and agingwithdignity.org are excellent resources.

## 2014-11-15 NOTE — Progress Notes (Signed)
[facility]  PATIENT: Vickie Brown DOB: 1940/12/14  REFERRING CLINICIAN: Enrigue Catena NP HISTORY FROM: patient  REASON FOR VISIT: new consult    HISTORICAL  CHIEF COMPLAINT:  Chief Complaint  Patient presents with  . New Patient (Initial Visit)    headaches, has had headaches all of her life, migraines 20 years ago, recently had a migraine, rm 6, alone    HISTORY OF PRESENT ILLNESS:   74 year old right-handed female here for evaluation of headaches. For past 3 months patient has had almost daily, nighttime headaches with intermittent bilateral parietal pain. Sometimes unilateral sometimes bilateral. Also with bitemporal pain and tenderness. No nausea, vomiting, photophobia, phonophobia. Sometimes she has mild vision changes with headaches.  Patient has history of migraine headaches for many years ago. She describes severe global headaches with nausea, photophobia and phonophobia. She would typically have to lay down in a dark quiet room. She gets upset thinking about how severe they're worse in the past. Last severe migraine headache occurred almost 20 years ago.  I asked patient about recent stresses or triggering factors for headaches. For the past one year she has been very stressed and upset about the well-being of her granddaughter who is 27 years old. Apparently the granddaughter just had a baby herself, is struggling with staying in school and also getting into trouble. Patient is very upset when discussing this and becomes quite tearful during our conversation.   REVIEW OF SYSTEMS: Full 14 system review of systems performed and notable only for weight gain fatigue swelling in legs rash moles cough wheezing blurred vision easy bruising feeling hot feeling cold joint pain headaches.  ALLERGIES: No Known Allergies  HOME MEDICATIONS:  Prior to Admission medications   Medication Sig Start Date End Date Taking? Authorizing Provider  aspirin 81 MG tablet Take  81 mg by mouth every evening.    Yes Historical Provider, MD  Cholecalciferol (VITAMIN D3) 2000 UNITS TABS Take 1 tablet by mouth daily.     Yes Historical Provider, MD  esomeprazole (NEXIUM) 40 MG capsule Take 40 mg by mouth daily before breakfast.     Yes Historical Provider, MD  Flaxseed, Linseed, (FLAXSEED OIL) 1000 MG CAPS Take 1 capsule by mouth daily.     Yes Historical Provider, MD  fluticasone (FLONASE) 50 MCG/ACT nasal spray Place 1 spray into both nostrils at bedtime. 07/08/13  Yes Garnetta Buddy, MD  JANUVIA 100 MG tablet Take 1 tablet by mouth Daily. 05/05/10  Yes Historical Provider, MD  losartan (COZAAR) 50 MG tablet Take 1 tablet by mouth Daily. 06/21/10  Yes Historical Provider, MD  Multiple Vitamins-Minerals (ALIVE ONCE DAILY WOMENS 50+ PO) Take 1 tablet by mouth daily.   Yes Historical Provider, MD  pioglitazone (ACTOS) 15 MG tablet Take 15 mg by mouth every evening.    Yes Historical Provider, MD  pravastatin (PRAVACHOL) 40 MG tablet Take 1 tablet by mouth Daily. 06/11/10  Yes Historical Provider, MD  Probiotic Product (ALIGN) 4 MG CAPS Take 4 mg by mouth daily.   Yes Historical Provider, MD     PAST MEDICAL HISTORY: Past Medical History  Diagnosis Date  . Type 2 diabetes mellitus (HCC)   . Chronic headaches   . Hyperlipidemia   . Knee pain     PAST SURGICAL HISTORY: Past Surgical History  Procedure Laterality Date  . Vesicovaginal fistula closure w/ tah    . Tonsilectomy, adenoidectomy, bilateral myringotomy and tubes  1950  . Btl    .  Bilateral tubal ligation  1971  . Partial hysterectomy  1973    FAMILY HISTORY: Family History  Problem Relation Age of Onset  . Cervical cancer Mother   . Diabetes Father   . Cancer Mother   . Diabetes Sister   . Diabetes Brother     SOCIAL HISTORY:  Social History   Social History  . Marital Status: Divorced    Spouse Name: N/A  . Number of Children: N/A  . Years of Education: N/A   Occupational History  .  retired    Social History Main Topics  . Smoking status: Never Smoker   . Smokeless tobacco: Not on file  . Alcohol Use: Yes     Comment: occasionally   . Drug Use: No  . Sexual Activity: No   Other Topics Concern  . Not on file   Social History Narrative   Lives alone.     PHYSICAL EXAM  GENERAL EXAM/CONSTITUTIONAL: Vitals:  Filed Vitals:   11/15/14 0915  BP: 99/57  Pulse: 57  Resp: 20  Height: 5\' 3"  (1.6 m)  Weight: 149 lb (67.586 kg)     Body mass index is 26.4 kg/(m^2).  No exam data present  Patient is in no distress; well developed, nourished and groomed; neck is supple  TEARFUL AT END OF VISIT  CARDIOVASCULAR:  Examination of carotid arteries is normal; no carotid bruits  Regular rate and rhythm, no murmurs  Examination of peripheral vascular system by observation and palpation is normal  EYES:  Ophthalmoscopic exam of optic discs and posterior segments is normal; no papilledema or hemorrhages  MUSCULOSKELETAL:  Gait, strength, tone, movements noted in Neurologic exam below  NEUROLOGIC: MENTAL STATUS:  No flowsheet data found.  awake, alert, oriented to person, place and time  recent and remote memory intact  normal attention and concentration  language fluent, comprehension intact, naming intact,   fund of knowledge appropriate  CRANIAL NERVE:   2nd - no papilledema on fundoscopic exam  2nd, 3rd, 4th, 6th - pupils equal and reactive to light, visual fields full to confrontation, extraocular muscles intact, no nystagmus  5th - facial sensation symmetric  7th - facial strength symmetric  8th - hearing intact  9th - palate elevates symmetrically, uvula midline  11th - shoulder shrug symmetric  12th - tongue protrusion midline  MOTOR:   normal bulk and tone, full strength in the BUE, BLE  SENSORY:   normal and symmetric to light touch, temperature, vibration  COORDINATION:   finger-nose-finger, fine finger movements  normal  REFLEXES:   deep tendon reflexes TRACE and symmetric  GAIT/STATION:   narrow based gait     DIAGNOSTIC DATA (LABS, IMAGING, TESTING) - I reviewed patient records, labs, notes, testing and imaging myself where available.  Lab Results  Component Value Date   WBC 6.4 07/08/2013   HGB 12.8 07/08/2013   HCT 38.9 07/08/2013   MCV 86.6 07/08/2013   PLT 258 07/08/2013      Component Value Date/Time   NA 132* 02/24/2013 2145   K 3.6* 02/24/2013 2145   CL 91* 02/24/2013 2145   CO2 26 02/24/2013 2145   GLUCOSE 116* 02/24/2013 2145   BUN 9 02/24/2013 2145   CREATININE 0.94 02/24/2013 2145   CALCIUM 9.4 02/24/2013 2145   PROT 7.3 02/24/2013 2145   ALBUMIN 3.7 02/24/2013 2145   AST 23 02/24/2013 2145   ALT 15 02/24/2013 2145   ALKPHOS 62 02/24/2013 2145   BILITOT 0.2* 02/24/2013 2145  GFRNONAA 58* 02/24/2013 2145   GFRAA 68* 02/24/2013 2145   Lab Results  Component Value Date   CHOL 134 07/09/2010   HDL 66 07/09/2010   LDLCALC 58 07/09/2010   TRIG 48 07/09/2010   CHOLHDL 2.0 07/09/2010   Lab Results  Component Value Date   HGBA1C 6.3* 07/09/2010   No results found for: [id number] Lab Results  Component Value Date   TSH 1.11 06/22/2010       ASSESSMENT AND PLAN  74 y.o. year old female here with new onset headache x 3 months; new type; biparietal; blurred vision. Has history of migraine from 20+ years ago, but these are new and different. Also with some increased stress in last 1 year related to her granddaughter (43yrs old with 1 yr old baby, missing school, getting in trouble).   Ddx: tension headache, temporal arteritis, migraine variant, secondary headache  New daily persistent headache - Plan: MR Brain Wo Contrast, Sedimentation Rate, C-reactive Protein    PLAN: - MRI brain and labs  - tylenol OTC prn headache  Orders Placed This Encounter  Procedures  . MR Brain Wo Contrast  . Sedimentation Rate  . C-reactive Protein   Return in about  6 weeks (around 12/27/2014).    Suanne Marker, MD 11/15/2014, 9:40 AM Certified in Neurology, Neurophysiology and Neuroimaging  [facility] [address] [place], [place] [address] [phone]

## 2014-11-16 LAB — C-REACTIVE PROTEIN: CRP: 1.5 mg/L (ref 0.0–4.9)

## 2014-11-16 LAB — SEDIMENTATION RATE: Sed Rate: 5 mm/hr (ref 0–40)

## 2014-11-19 ENCOUNTER — Telehealth: Payer: Self-pay | Admitting: *Deleted

## 2014-11-19 NOTE — Telephone Encounter (Signed)
Spoke with patient and informed her per Dr Marjory LiesPenumalli, her labs are normal. She verbalized understanding.

## 2014-11-21 DIAGNOSIS — J4520 Mild intermittent asthma, uncomplicated: Secondary | ICD-10-CM | POA: Diagnosis not present

## 2014-11-26 ENCOUNTER — Ambulatory Visit
Admission: RE | Admit: 2014-11-26 | Discharge: 2014-11-26 | Disposition: A | Discharge status: Home / Self Care | Payer: Medicare Other | Source: Ambulatory Visit | Attending: Diagnostic Neuroimaging | Admitting: Diagnostic Neuroimaging

## 2014-11-26 DIAGNOSIS — G4452 New daily persistent headache (NDPH): Secondary | ICD-10-CM

## 2014-12-21 DIAGNOSIS — J4520 Mild intermittent asthma, uncomplicated: Secondary | ICD-10-CM | POA: Diagnosis not present

## 2014-12-30 ENCOUNTER — Encounter: Payer: Self-pay | Admitting: Diagnostic Neuroimaging

## 2014-12-30 ENCOUNTER — Ambulatory Visit (INDEPENDENT_AMBULATORY_CARE_PROVIDER_SITE_OTHER): Payer: Medicare Other | Admitting: Diagnostic Neuroimaging

## 2014-12-30 VITALS — BP 113/56 | HR 66 | Wt 172.0 lb

## 2014-12-30 DIAGNOSIS — I129 Hypertensive chronic kidney disease with stage 1 through stage 4 chronic kidney disease, or unspecified chronic kidney disease: Secondary | ICD-10-CM | POA: Diagnosis not present

## 2014-12-30 DIAGNOSIS — G4452 New daily persistent headache (NDPH): Secondary | ICD-10-CM | POA: Diagnosis not present

## 2014-12-30 DIAGNOSIS — E1122 Type 2 diabetes mellitus with diabetic chronic kidney disease: Secondary | ICD-10-CM | POA: Diagnosis not present

## 2014-12-30 DIAGNOSIS — G43009 Migraine without aura, not intractable, without status migrainosus: Secondary | ICD-10-CM | POA: Diagnosis not present

## 2014-12-30 DIAGNOSIS — N182 Chronic kidney disease, stage 2 (mild): Secondary | ICD-10-CM | POA: Diagnosis not present

## 2014-12-30 DIAGNOSIS — N08 Glomerular disorders in diseases classified elsewhere: Secondary | ICD-10-CM | POA: Diagnosis not present

## 2014-12-30 MED ORDER — AMITRIPTYLINE HCL 10 MG PO TABS: 10.0000 mg | ORAL_TABLET | Freq: Every day | ORAL | Status: DC

## 2014-12-30 NOTE — Progress Notes (Signed)
[facility]  PATIENT: Vickie Brown DOB: [birth year]  REFERRING CLINICIAN:  HISTORY FROM: patient  REASON FOR VISIT: follow up   HISTORICAL  CHIEF COMPLAINT:  Chief Complaint  Patient presents with  . Headache    rm 7, HA only improved in past 2 days"  . Follow-up    HISTORY OF PRESENT ILLNESS:   UPDATE 12/30/14: Since last visit, doing about the same. Has used her friend's tylenol #3 and vicodin; does not want to try OTC meds. Having severe headaches now ~ 3 per week. Some are migrainous. Some are sharp and shooting.   PRIOR HPI (11/15/14): 74 year old right-handed female here for evaluation of headaches. For past 3 months patient has had almost daily, nighttime headaches with intermittent bilateral parietal pain. Sometimes unilateral sometimes bilateral. Also with bitemporal pain and tenderness. No nausea, vomiting, photophobia, phonophobia. Sometimes she has mild vision changes with headaches. Patient has history of migraine headaches for many years ago. She describes severe global headaches with nausea, photophobia and phonophobia. She would typically have to lay down in a dark quiet room. She gets upset thinking about how severe they're worse in the past. Last severe migraine headache occurred almost 20 years ago.  I asked patient about recent stresses or triggering factors for headaches. For the past one year she has been very stressed and upset about the well-being of her granddaughter who is 36 years old. Apparently the granddaughter just had a baby herself, is struggling with staying in school and also getting into trouble. Patient is very upset when discussing this and becomes quite tearful during our conversation.   REVIEW OF SYSTEMS: Full 14 system review of systems performed and notable only for headache fatigue exces sweating ear pain ringing in ears choking cough wheezing walking diff.  ALLERGIES: No Known Allergies  HOME MEDICATIONS: Outpatient  Prescriptions Prior to Visit  Medication Sig Dispense Refill  . aspirin 81 MG tablet Take 81 mg by mouth every evening.     . Cholecalciferol (VITAMIN D3) 2000 UNITS TABS Take 1 tablet by mouth daily.      Marland Kitchen esomeprazole (NEXIUM) 40 MG capsule Take 40 mg by mouth daily before breakfast.      . Flaxseed, Linseed, (FLAXSEED OIL) 1000 MG CAPS Take 1 capsule by mouth daily.      . fluticasone (FLONASE) 50 MCG/ACT nasal spray Place 1 spray into both nostrils at bedtime. 16 g 2  . JANUVIA 100 MG tablet Take 1 tablet by mouth Daily.    Marland Kitchen losartan (COZAAR) 50 MG tablet Take 1 tablet by mouth Daily.    . Multiple Vitamins-Minerals (ALIVE ONCE DAILY WOMENS 50+ PO) Take 1 tablet by mouth daily.    . pioglitazone (ACTOS) 15 MG tablet Take 15 mg by mouth every evening.     . pravastatin (PRAVACHOL) 40 MG tablet Take 1 tablet by mouth Daily.    . Probiotic Product (ALIGN) 4 MG CAPS Take 4 mg by mouth daily.     No facility-administered medications prior to visit.     PAST MEDICAL HISTORY: Past Medical History  Diagnosis Date  . Type 2 diabetes mellitus ([place])   . Chronic headaches   . Hyperlipidemia   . Knee pain     PAST SURGICAL HISTORY: Past Surgical History  Procedure Laterality Date  . Vesicovaginal fistula closure w/ tah    . Tonsilectomy, adenoidectomy, bilateral myringotomy and tubes  1950  . Btl    . Bilateral tubal ligation  1971  .  Partial hysterectomy  1973    FAMILY HISTORY: Family History  Problem Relation Age of Onset  . Cervical cancer Mother   . Diabetes Father   . Cancer Mother   . Diabetes Sister   . Diabetes Brother     SOCIAL HISTORY:  Social History   Social History  . Marital Status: Divorced    Spouse Name: N/A  . Number of Children: N/A  . Years of Education: N/A   Occupational History  . retired    Social History Main Topics  . Smoking status: Never Smoker   . Smokeless tobacco: Not on file  . Alcohol Use: Yes     Comment: occasionally   .  Drug Use: No  . Sexual Activity: No   Other Topics Concern  . Not on file   Social History Narrative   Lives alone.     PHYSICAL EXAM  GENERAL EXAM/CONSTITUTIONAL: Vitals:  Filed Vitals:   12/30/14 1424  BP: 113/56  Pulse: 66  Weight: 172 lb (78.019 kg)   Body mass index is 30.48 kg/(m^2). No exam data present  Patient is in no distress; well developed, nourished and groomed; neck is supple  CARDIOVASCULAR:  Examination of carotid arteries is normal; no carotid bruits  Regular rate and rhythm, no murmurs  Examination of peripheral vascular system by observation and palpation is normal  EYES:  Ophthalmoscopic exam of optic discs and posterior segments is normal; no papilledema or hemorrhages  MUSCULOSKELETAL:  Gait, strength, tone, movements noted in Neurologic exam below  NEUROLOGIC: MENTAL STATUS:  No flowsheet data found.  awake, alert, oriented to person, place and time  recent and remote memory intact  normal attention and concentration  language fluent, comprehension intact, naming intact,   fund of knowledge appropriate  CRANIAL NERVE:   2nd - no papilledema on fundoscopic exam  2nd, 3rd, 4th, 6th - pupils equal and reactive to light, visual fields full to confrontation, extraocular muscles intact, no nystagmus  5th - facial sensation symmetric  7th - facial strength symmetric  8th - hearing intact  9th - palate elevates symmetrically, uvula midline  11th - shoulder shrug symmetric  12th - tongue protrusion midline  MOTOR:   normal bulk and tone, full strength in the BUE, BLE  SENSORY:   normal and symmetric to light touch, temperature, vibration  COORDINATION:   finger-nose-finger, fine finger movements normal  REFLEXES:   deep tendon reflexes TRACE and symmetric  GAIT/STATION:   narrow based gait     DIAGNOSTIC DATA (LABS, IMAGING, TESTING) - I reviewed patient records, labs, notes, testing and imaging myself  where available.  Lab Results  Component Value Date   WBC 6.4 07/08/2013   HGB 12.8 07/08/2013   HCT 38.9 07/08/2013   MCV 86.6 07/08/2013   PLT 258 07/08/2013      Component Value Date/Time   NA 132* 02/24/2013 2145   K 3.6* 02/24/2013 2145   CL 91* 02/24/2013 2145   CO2 26 02/24/2013 2145   GLUCOSE 116* 02/24/2013 2145   BUN 9 02/24/2013 2145   CREATININE 0.94 02/24/2013 2145   CALCIUM 9.4 02/24/2013 2145   PROT 7.3 02/24/2013 2145   ALBUMIN 3.7 02/24/2013 2145   AST 23 02/24/2013 2145   ALT 15 02/24/2013 2145   ALKPHOS 62 02/24/2013 2145   BILITOT 0.2* 02/24/2013 2145   GFRNONAA 58* 02/24/2013 2145   GFRAA 68* 02/24/2013 2145   Lab Results  Component Value Date   CHOL 134 07/09/2010  HDL 66 07/09/2010   LDLCALC 58 07/09/2010   TRIG 48 07/09/2010   CHOLHDL 2.0 07/09/2010   Lab Results  Component Value Date   HGBA1C 6.3* 07/09/2010   No results found for: VITAMINB12 Lab Results  Component Value Date   TSH 1.11 06/22/2010   Lab Results  Component Value Date   ESRSEDRATE 5 11/15/2014   Lab Results  Component Value Date   CRP 1.5 11/15/2014    11/26/14 MRI brain (without) demonstrating: 1. Mild scattered periventricular and subcortical and pontine non-specific gliosis, likely chronic small vessel ischemic disease.  2. No acute findings.    ASSESSMENT AND PLAN  74 y.o. year old female here with new onset headache x 3 months; new type; biparietal; blurred vision. Has history of migraine from 20+ years ago, but these are new and different. Also with some increased stress in last 1 year related to her granddaughter (38yr old with 172yr old baby, missing school, getting in trouble). Neuro exam and MRI are unremarkable. ESR and CRP are normal.   Dx: tension headache + migraine headaches  PLAN: - trial of low dose amitriptyline 182mat bedtime - tylenol prn headache  Meds ordered this encounter  Medications  . amitriptyline (ELAVIL) 10 MG tablet     Sig: Take 1 tablet (10 mg total) by mouth at bedtime.    Dispense:  30 tablet    Refill:  3   Return in about 3 months (around 03/30/2015), or if symptoms worsen or fail to improve, for return to PCP.    VIPenni BombardMD [id number]:40M Certified in Neurology, Neurophysiology and Neuroimaging  [facility] [address] [phone]

## 2014-12-30 NOTE — Patient Instructions (Addendum)
Thank you for coming to see [place] at [facility]. I hope we have been able to provide you high quality care today.  You may receive a patient satisfaction survey over the next few weeks. We would appreciate your feedback and comments so that we may continue to improve ourselves and the health of our patients.  - try amitriptyline 53m at bedtime - monitor symptoms   ~~~~~~~~~~~~~~~~~~~~~~~~~~~~~~~~~~~~~~~~~~~~~~~~~~~~~~~~~~~~~~~~~  DR. Taylor Levick'S GUIDE TO HAPPY AND HEALTHY LIVING These are some of my general health and wellness recommendations. Some of them may apply to you better than others. Please use common sense as you try these suggestions and feel free to ask me any questions.   ACTIVITY/FITNESS Mental, social, emotional and physical stimulation are very important for brain and body health. Try learning a new activity (arts, music, language, sports, games).  Keep moving your body to the best of your abilities. You can do this at home, inside or outside, the park, community center, gym or anywhere you like. Consider a physical therapist or personal trainer to get started. Consider the app Sworkit. Fitness trackers such as smart-watches, smart-phones or Fitbits can help as well.   NUTRITION Eat more plants: colorful vegetables, nuts, seeds and berries.  Eat less sugar, salt, preservatives and processed foods.  Avoid toxins such as cigarettes and alcohol.  Drink water when you are thirsty. Warm water with a slice of lemon is an excellent morning drink to start the day.  Consider these websites for more information The Nutrition Source (hhttps://www.henry-hernandez.biz/ [employer] Nutrition (wWindowBlog.ch   RELAXATION Consider practicing mindfulness meditation or other relaxation techniques such as deep breathing, prayer, yoga, tai chi, massage. See website mindful.org or the apps Headspace or Calm to help get  started.   SLEEP Try to get at least 7-8+ hours sleep per day. Regular exercise and reduced caffeine will help you sleep better. Practice good sleep hygeine techniques. See website sleep.org for more information.   PLANNING Prepare estate planning, living will, healthcare POA documents. Sometimes this is best planned with the help of an attorney. Theconversationproject.org and agingwithdignity.org are excellent resources.

## 2015-01-20 DIAGNOSIS — E119 Type 2 diabetes mellitus without complications: Secondary | ICD-10-CM | POA: Diagnosis not present

## 2015-01-21 DIAGNOSIS — J4520 Mild intermittent asthma, uncomplicated: Secondary | ICD-10-CM | POA: Diagnosis not present

## 2015-02-19 DIAGNOSIS — J320 Chronic maxillary sinusitis: Secondary | ICD-10-CM | POA: Diagnosis not present

## 2015-02-19 DIAGNOSIS — L309 Dermatitis, unspecified: Secondary | ICD-10-CM | POA: Diagnosis not present

## 2015-02-19 DIAGNOSIS — N182 Chronic kidney disease, stage 2 (mild): Secondary | ICD-10-CM | POA: Diagnosis not present

## 2015-02-19 DIAGNOSIS — I129 Hypertensive chronic kidney disease with stage 1 through stage 4 chronic kidney disease, or unspecified chronic kidney disease: Secondary | ICD-10-CM | POA: Diagnosis not present

## 2015-02-21 DIAGNOSIS — J4520 Mild intermittent asthma, uncomplicated: Secondary | ICD-10-CM | POA: Diagnosis not present

## 2015-03-07 DIAGNOSIS — L84 Corns and callosities: Secondary | ICD-10-CM | POA: Diagnosis not present

## 2015-03-07 DIAGNOSIS — E1122 Type 2 diabetes mellitus with diabetic chronic kidney disease: Secondary | ICD-10-CM | POA: Diagnosis not present

## 2015-03-21 DIAGNOSIS — J4520 Mild intermittent asthma, uncomplicated: Secondary | ICD-10-CM | POA: Diagnosis not present

## 2015-03-26 ENCOUNTER — Other Ambulatory Visit: Payer: Self-pay | Admitting: Internal Medicine

## 2015-03-26 ENCOUNTER — Ambulatory Visit
Admission: RE | Admit: 2015-03-26 | Discharge: 2015-03-26 | Disposition: A | Discharge status: Home / Self Care | Payer: Medicare Other | Source: Ambulatory Visit | Attending: Internal Medicine | Admitting: Internal Medicine

## 2015-03-26 DIAGNOSIS — J40 Bronchitis, not specified as acute or chronic: Secondary | ICD-10-CM | POA: Diagnosis not present

## 2015-03-26 DIAGNOSIS — R059 Cough, unspecified: Secondary | ICD-10-CM

## 2015-03-26 DIAGNOSIS — R05 Cough: Secondary | ICD-10-CM | POA: Diagnosis not present

## 2015-03-26 DIAGNOSIS — R0602 Shortness of breath: Secondary | ICD-10-CM | POA: Diagnosis not present

## 2015-03-31 ENCOUNTER — Ambulatory Visit: Payer: Medicare Other | Admitting: Diagnostic Neuroimaging

## 2015-04-09 ENCOUNTER — Other Ambulatory Visit: Payer: Self-pay

## 2015-04-09 DIAGNOSIS — Z1231 Encounter for screening mammogram for malignant neoplasm of breast: Secondary | ICD-10-CM

## 2015-04-29 DIAGNOSIS — I129 Hypertensive chronic kidney disease with stage 1 through stage 4 chronic kidney disease, or unspecified chronic kidney disease: Secondary | ICD-10-CM | POA: Diagnosis not present

## 2015-04-29 DIAGNOSIS — E1122 Type 2 diabetes mellitus with diabetic chronic kidney disease: Secondary | ICD-10-CM | POA: Diagnosis not present

## 2015-04-29 DIAGNOSIS — N182 Chronic kidney disease, stage 2 (mild): Secondary | ICD-10-CM | POA: Diagnosis not present

## 2015-04-29 DIAGNOSIS — E559 Vitamin D deficiency, unspecified: Secondary | ICD-10-CM | POA: Diagnosis not present

## 2015-04-29 DIAGNOSIS — Z0000 Encounter for general adult medical examination without abnormal findings: Secondary | ICD-10-CM | POA: Diagnosis not present

## 2015-04-29 DIAGNOSIS — Z1389 Encounter for screening for other disorder: Secondary | ICD-10-CM | POA: Diagnosis not present

## 2015-05-05 DIAGNOSIS — E119 Type 2 diabetes mellitus without complications: Secondary | ICD-10-CM | POA: Diagnosis not present

## 2015-05-15 ENCOUNTER — Ambulatory Visit
Admission: RE | Admit: 2015-05-15 | Discharge: 2015-05-15 | Disposition: A | Discharge status: Home / Self Care | Payer: Medicare Other | Source: Ambulatory Visit

## 2015-05-15 DIAGNOSIS — Z1231 Encounter for screening mammogram for malignant neoplasm of breast: Secondary | ICD-10-CM

## 2015-06-11 DIAGNOSIS — F329 Major depressive disorder, single episode, unspecified: Secondary | ICD-10-CM | POA: Diagnosis not present

## 2015-06-11 DIAGNOSIS — Z79899 Other long term (current) drug therapy: Secondary | ICD-10-CM | POA: Diagnosis not present

## 2015-07-24 DIAGNOSIS — E119 Type 2 diabetes mellitus without complications: Secondary | ICD-10-CM | POA: Diagnosis not present

## 2015-09-03 DIAGNOSIS — I129 Hypertensive chronic kidney disease with stage 1 through stage 4 chronic kidney disease, or unspecified chronic kidney disease: Secondary | ICD-10-CM | POA: Diagnosis not present

## 2015-09-03 DIAGNOSIS — N08 Glomerular disorders in diseases classified elsewhere: Secondary | ICD-10-CM | POA: Diagnosis not present

## 2015-09-03 DIAGNOSIS — N182 Chronic kidney disease, stage 2 (mild): Secondary | ICD-10-CM | POA: Diagnosis not present

## 2015-09-03 DIAGNOSIS — E1122 Type 2 diabetes mellitus with diabetic chronic kidney disease: Secondary | ICD-10-CM | POA: Diagnosis not present

## 2015-09-18 DIAGNOSIS — R1032 Left lower quadrant pain: Secondary | ICD-10-CM | POA: Diagnosis not present

## 2015-09-18 DIAGNOSIS — R350 Frequency of micturition: Secondary | ICD-10-CM | POA: Diagnosis not present

## 2015-09-18 DIAGNOSIS — R102 Pelvic and perineal pain: Secondary | ICD-10-CM | POA: Diagnosis not present

## 2015-09-23 DIAGNOSIS — R102 Pelvic and perineal pain: Secondary | ICD-10-CM | POA: Diagnosis not present

## 2015-09-23 DIAGNOSIS — R350 Frequency of micturition: Secondary | ICD-10-CM | POA: Diagnosis not present

## 2015-09-23 DIAGNOSIS — E119 Type 2 diabetes mellitus without complications: Secondary | ICD-10-CM | POA: Diagnosis not present

## 2015-09-26 LAB — GLUCOSE, POCT (MANUAL RESULT ENTRY): POC GLUCOSE: 103 mg/dL — AB (ref 70–99)

## 2015-12-23 DIAGNOSIS — E119 Type 2 diabetes mellitus without complications: Secondary | ICD-10-CM | POA: Diagnosis not present

## 2015-12-25 DIAGNOSIS — N182 Chronic kidney disease, stage 2 (mild): Secondary | ICD-10-CM | POA: Diagnosis not present

## 2015-12-25 DIAGNOSIS — E1122 Type 2 diabetes mellitus with diabetic chronic kidney disease: Secondary | ICD-10-CM | POA: Diagnosis not present

## 2015-12-25 DIAGNOSIS — N08 Glomerular disorders in diseases classified elsewhere: Secondary | ICD-10-CM | POA: Diagnosis not present

## 2015-12-25 DIAGNOSIS — I129 Hypertensive chronic kidney disease with stage 1 through stage 4 chronic kidney disease, or unspecified chronic kidney disease: Secondary | ICD-10-CM | POA: Diagnosis not present

## 2015-12-31 ENCOUNTER — Other Ambulatory Visit: Payer: Self-pay | Admitting: Internal Medicine

## 2015-12-31 DIAGNOSIS — R102 Pelvic and perineal pain: Secondary | ICD-10-CM

## 2016-01-02 ENCOUNTER — Ambulatory Visit
Admission: RE | Admit: 2016-01-02 | Discharge: 2016-01-02 | Disposition: A | Discharge status: Home / Self Care | Payer: Medicare Other | Source: Ambulatory Visit | Attending: Internal Medicine | Admitting: Internal Medicine

## 2016-01-02 DIAGNOSIS — R102 Pelvic and perineal pain: Secondary | ICD-10-CM

## 2016-04-14 ENCOUNTER — Other Ambulatory Visit: Payer: Self-pay | Admitting: Internal Medicine

## 2016-04-14 DIAGNOSIS — Z1231 Encounter for screening mammogram for malignant neoplasm of breast: Secondary | ICD-10-CM

## 2016-05-26 ENCOUNTER — Ambulatory Visit
Admission: RE | Admit: 2016-05-26 | Discharge: 2016-05-26 | Disposition: A | Discharge status: Home / Self Care | Payer: Medicare Other | Source: Ambulatory Visit | Attending: Internal Medicine | Admitting: Internal Medicine

## 2016-05-26 DIAGNOSIS — Z1231 Encounter for screening mammogram for malignant neoplasm of breast: Secondary | ICD-10-CM

## 2016-07-25 ENCOUNTER — Encounter (HOSPITAL_BASED_OUTPATIENT_CLINIC_OR_DEPARTMENT_OTHER): Payer: Self-pay

## 2016-07-25 ENCOUNTER — Emergency Department (HOSPITAL_BASED_OUTPATIENT_CLINIC_OR_DEPARTMENT_OTHER)
Admission: EM | Admit: 2016-07-25 | Discharge: 2016-07-25 | Disposition: A | Discharge status: Home / Self Care | Payer: Medicare PPO | Attending: Emergency Medicine | Admitting: Emergency Medicine

## 2016-07-25 ENCOUNTER — Emergency Department (HOSPITAL_BASED_OUTPATIENT_CLINIC_OR_DEPARTMENT_OTHER): Payer: Medicare PPO

## 2016-07-25 DIAGNOSIS — K5792 Diverticulitis of intestine, part unspecified, without perforation or abscess without bleeding: Secondary | ICD-10-CM

## 2016-07-25 DIAGNOSIS — M549 Dorsalgia, unspecified: Secondary | ICD-10-CM | POA: Diagnosis not present

## 2016-07-25 DIAGNOSIS — Z79899 Other long term (current) drug therapy: Secondary | ICD-10-CM | POA: Diagnosis not present

## 2016-07-25 DIAGNOSIS — K5732 Diverticulitis of large intestine without perforation or abscess without bleeding: Secondary | ICD-10-CM | POA: Diagnosis not present

## 2016-07-25 DIAGNOSIS — E119 Type 2 diabetes mellitus without complications: Secondary | ICD-10-CM | POA: Insufficient documentation

## 2016-07-25 DIAGNOSIS — Z7902 Long term (current) use of antithrombotics/antiplatelets: Secondary | ICD-10-CM | POA: Insufficient documentation

## 2016-07-25 DIAGNOSIS — Z7982 Long term (current) use of aspirin: Secondary | ICD-10-CM | POA: Insufficient documentation

## 2016-07-25 DIAGNOSIS — R1013 Epigastric pain: Secondary | ICD-10-CM | POA: Diagnosis present

## 2016-07-25 HISTORY — DX: Diverticulitis of intestine, part unspecified, without perforation or abscess without bleeding: K57.92

## 2016-07-25 LAB — COMPREHENSIVE METABOLIC PANEL
ALBUMIN: 3.7 g/dL (ref 3.5–5.0)
ALT: 31 U/L (ref 14–54)
AST: 43 U/L — AB (ref 15–41)
Alkaline Phosphatase: 62 U/L (ref 38–126)
Anion gap: 9 (ref 5–15)
BILIRUBIN TOTAL: 0.3 mg/dL (ref 0.3–1.2)
BUN: 6 mg/dL (ref 6–20)
CO2: 28 mmol/L (ref 22–32)
CREATININE: 1 mg/dL (ref 0.44–1.00)
Calcium: 9.2 mg/dL (ref 8.9–10.3)
Chloride: 97 mmol/L — ABNORMAL LOW (ref 101–111)
GFR calc Af Amer: >60 mL/min (ref >60)
GFR, EST NON AFRICAN AMERICAN: 53 mL/min — AB (ref >60)
GLUCOSE: 115 mg/dL — AB (ref 65–99)
Potassium: 4.1 mmol/L (ref 3.5–5.1)
Sodium: 134 mmol/L — ABNORMAL LOW (ref 135–145)
TOTAL PROTEIN: 6.8 g/dL (ref 6.5–8.1)

## 2016-07-25 LAB — CBC WITH DIFFERENTIAL/PLATELET
BASOS ABS: 0 10*3/uL (ref 0.0–0.1)
BASOS PCT: 1 %
EOS PCT: 1 %
Eosinophils Absolute: 0.1 10*3/uL (ref 0.0–0.7)
HCT: 35.6 % — ABNORMAL LOW (ref 36.0–46.0)
Hemoglobin: 11.8 g/dL — ABNORMAL LOW (ref 12.0–15.0)
Lymphocytes Relative: 37 %
Lymphs Abs: 1.8 10*3/uL (ref 0.7–4.0)
MCH: 28.6 pg (ref 26.0–34.0)
MCHC: 33.1 g/dL (ref 30.0–36.0)
MCV: 86.2 fL (ref 78.0–100.0)
MONO ABS: 0.3 10*3/uL (ref 0.1–1.0)
MONOS PCT: 6 %
Neutro Abs: 2.7 10*3/uL (ref 1.7–7.7)
Neutrophils Relative %: 55 %
PLATELETS: 245 10*3/uL (ref 150–400)
RBC: 4.13 MIL/uL (ref 3.87–5.11)
RDW: 14.2 % (ref 11.5–15.5)
WBC: 5 10*3/uL (ref 4.0–10.5)

## 2016-07-25 LAB — TROPONIN I: Troponin I: <0.03 ng/mL (ref <0.03)

## 2016-07-25 LAB — LIPASE, BLOOD: Lipase: 32 U/L (ref 11–51)

## 2016-07-25 MED ORDER — MORPHINE SULFATE (PF) 4 MG/ML IV SOLN
INTRAVENOUS | Status: AC
  Filled 2016-07-25: qty 1

## 2016-07-25 MED ORDER — ONDANSETRON HCL 4 MG/2ML IJ SOLN
4.0000 mg | Freq: Once | INTRAMUSCULAR | Status: AC
  Administered 2016-07-25: 4 mg via INTRAVENOUS
  Filled 2016-07-25: qty 2

## 2016-07-25 MED ORDER — METRONIDAZOLE 500 MG PO TABS: ORAL_TABLET | ORAL | 0 refills | Status: DC

## 2016-07-25 MED ORDER — CIPROFLOXACIN HCL 500 MG PO TABS: 500.0000 mg | ORAL_TABLET | Freq: Two times a day (BID) | ORAL | 0 refills | Status: DC

## 2016-07-25 MED ORDER — MORPHINE SULFATE (PF) 4 MG/ML IV SOLN
4.0000 mg | Freq: Once | INTRAVENOUS | Status: AC
  Administered 2016-07-25: 4 mg via INTRAVENOUS
  Filled 2016-07-25: qty 1

## 2016-07-25 MED ORDER — HYDROCODONE-ACETAMINOPHEN 5-325 MG PO TABS: 1.0000 | ORAL_TABLET | Freq: Four times a day (QID) | ORAL | 0 refills | Status: DC | PRN

## 2016-07-25 MED ORDER — IOPAMIDOL (ISOVUE-300) INJECTION 61%
100.0000 mL | Freq: Once | INTRAVENOUS | Status: AC | PRN
  Administered 2016-07-25: 100 mL via INTRAVENOUS

## 2016-07-25 MED ORDER — SODIUM CHLORIDE 0.9 % IV BOLUS (SEPSIS)
500.0000 mL | Freq: Once | INTRAVENOUS | Status: AC
  Administered 2016-07-25: 500 mL via INTRAVENOUS

## 2016-07-25 MED ORDER — MORPHINE SULFATE (PF) 4 MG/ML IV SOLN
4.0000 mg | Freq: Once | INTRAVENOUS | Status: AC
  Administered 2016-07-25: 4 mg via INTRAVENOUS

## 2016-07-25 NOTE — ED Notes (Signed)
ED Provider at bedside. Drinking CT contrast

## 2016-07-25 NOTE — ED Provider Notes (Signed)
[facility] Provider Note   CSN: [id number] Arrival date & time: 07/25/16  1526     History   Chief Complaint Chief Complaint  Patient presents with  . Back Pain  . Abdominal Pain    HPI Vickie Brown is a 76 y.o. female.  Patient is a 76 year old female with past medical history of diabetes, hypercholesterolemia, diverticulitis presenting with complaints of epigastric pain. This pain started this morning and radiates into her back. It is associated with nausea and belching, however no vomiting. She has had 2 bowel movements today. She denies any fevers or chills.    The history is provided by the patient.  Abdominal Pain   This is a new problem. Episode onset: This morning. The problem occurs constantly. The problem has been gradually worsening. The pain is located in the epigastric region. The quality of the pain is cramping. The pain is moderate. Associated symptoms include nausea. Pertinent negatives include fever, vomiting and constipation. Nothing aggravates the symptoms. Nothing relieves the symptoms.    Past Medical History:  Diagnosis Date  . Chronic headaches   . Diverticulitis   . Hyperlipidemia   . Knee pain   . Type 2 diabetes mellitus [facility](HCC)     Patient Active Problem List   Diagnosis Date Noted  . Dyspnea on exertion 06/22/2010  . Myalgia 06/22/2010    Past Surgical History:  Procedure Laterality Date  . Bilateral tubal ligation  1971  . BTL    . PARTIAL HYSTERECTOMY  1973  . TONSILECTOMY, ADENOIDECTOMY, BILATERAL MYRINGOTOMY AND TUBES  1950  . VESICOVAGINAL FISTULA CLOSURE W/ TAH      OB History    Gravida Para Term Preterm AB Living   7 5     2 5    SAB TAB Ectopic Multiple Live Births   2               Home Medications    Prior to Admission medications   Medication Sig Start Date End Date Taking? Authorizing Provider  amitriptyline (ELAVIL) 10 MG tablet Take 1 tablet (10 mg total) by mouth at bedtime. 12/30/14   Penumalli,  Glenford BayleyVikram R, MD  aspirin 81 MG tablet Take 81 mg by mouth every evening.     [provider]  Cholecalciferol (VITAMIN D3) 2000 UNITS TABS Take 1 tablet by mouth daily.      [provider]  clobetasol cream (TEMOVATE) 0.05 % apply A THIN LAYER TO AFFECTED AREAS 2 TIMES DAILY 12/20/14   [provider]  esomeprazole (NEXIUM) 40 MG capsule Take 40 mg by mouth daily before breakfast.      [provider]  Flaxseed, Linseed, (FLAXSEED OIL) 1000 MG CAPS Take 1 capsule by mouth daily.      [provider]  fluticasone (FLONASE) 50 MCG/ACT nasal spray Place 1 spray into both nostrils at bedtime. 07/08/13   Garnetta BuddyWilliamson, Edward V, MD  JANUVIA 100 MG tablet Take 1 tablet by mouth Daily. 05/05/10   [provider]  losartan (COZAAR) 50 MG tablet Take 1 tablet by mouth Daily. 06/21/10   [provider]  Multiple Vitamins-Minerals (ALIVE ONCE DAILY WOMENS 50+ PO) Take 1 tablet by mouth daily.    [provider]  pioglitazone (ACTOS) 15 MG tablet Take 15 mg by mouth every evening.     [provider]  pravastatin (PRAVACHOL) 40 MG tablet Take 1 tablet by mouth Daily. 06/11/10   [provider]  Probiotic Product (ALIGN) 4  MG CAPS Take 4 mg by mouth daily.    [provider]    Family History Family History  Problem Relation Age of Onset  . Cervical cancer Mother   . Cancer Mother   . Diabetes Father   . Diabetes Sister   . Diabetes Brother   . Breast cancer Neg Hx     Social History Social History  Substance Use Topics  . Smoking status: Never Smoker  . Smokeless tobacco: Never Used  . Alcohol use Yes     Comment: occasionally      Allergies   Patient has no known allergies.   Review of Systems Review of Systems  Constitutional: Negative for fever.  Gastrointestinal: Positive for abdominal pain and nausea. Negative for constipation and vomiting.  All other systems reviewed and are  negative.    Physical Exam Updated Vital Signs BP (!) 121/49 (BP Location: Left Arm)   Pulse 80   Temp 98.6 F (37 C) (Oral)   Resp 19   SpO2 99%   Physical Exam  Constitutional: She is oriented to person, place, and time. She appears well-developed and well-nourished. No distress.  HENT:  Head: Normocephalic and atraumatic.  Neck: Normal range of motion. Neck supple.  Cardiovascular: Normal rate and regular rhythm.  Exam reveals no gallop and no friction rub.   No murmur heard. Pulmonary/Chest: Effort normal and breath sounds normal. No respiratory distress. She has no wheezes.  Abdominal: Soft. Bowel sounds are normal. She exhibits no distension. There is tenderness. There is no rebound and no guarding.  There is tenderness in the epigastric region and right upper quadrant. There is no rebound and no guarding.  Musculoskeletal: Normal range of motion. She exhibits no edema.  Neurological: She is alert and oriented to person, place, and time.  Skin: Skin is warm and dry. She is not diaphoretic.  Nursing note and vitals reviewed.    ED Treatments / Results  Labs (all labs ordered are listed, but only abnormal results are displayed) Labs Reviewed  COMPREHENSIVE METABOLIC PANEL  LIPASE, BLOOD  CBC WITH DIFFERENTIAL/PLATELET  TROPONIN I    EKG  EKG Interpretation  Date/Time:  Sunday July 25 2016 15:38:51 EDT Ventricular Rate:  73 PR Interval:    QRS Duration: 96 QT Interval:  398 QTC Calculation: 439 R Axis:   76 Text Interpretation:  Sinus rhythm Atrial premature complex Normal ECG Confirmed by Geoffery Lyons ([id number]) on 07/25/2016 3:57:07 PM       Radiology No results found.  Procedures Procedures (including critical care time)  Medications Ordered in ED Medications  sodium chloride 0.9 % bolus 500 mL (not administered)  ondansetron (ZOFRAN) injection 4 mg (not administered)  morphine 4 MG/ML injection 4 mg (not administered)     Initial Impression /  Assessment and Plan / ED Course  I have reviewed the triage vital signs and the nursing notes.  Pertinent labs & imaging results that were available during my care of the patient were reviewed by me and considered in my medical decision making (see chart for details).  Patient presents with abdominal pain. CT scan reveals distal ileal diverticulitis, however no abscess or perforation. This will be treated with Cipro, Flagyl, pain medicine, and follow-up with primary Dr. as needed. She is to return as needed if worsens. She has no fever, no white count, and abdominal exam reveals tenderness, but no peritoneal signs.  Final Clinical Impressions(s) / ED Diagnoses   Final diagnoses:  None  New Prescriptions New Prescriptions   No medications on file     Geoffery Lyons, MD 07/25/16 [phone]

## 2016-07-25 NOTE — ED Triage Notes (Signed)
Pt reports back pain that started this morning and epigastric pain. Reports nausea, denies vomiting.

## 2016-07-25 NOTE — Discharge Instructions (Signed)
Cipro and Flagyl as prescribed. ° °Hydrocodone as prescribed as needed for pain. ° °Return to the emergency department if you develop worsening pain, high fevers, bloody stools, or other new and concerning symptoms. °

## 2016-07-25 NOTE — ED Notes (Signed)
Pt on cardiac monitor and auto VS 

## 2016-07-25 NOTE — ED Notes (Signed)
Patient transported to CT 

## 2016-10-20 DIAGNOSIS — N08 Glomerular disorders in diseases classified elsewhere: Secondary | ICD-10-CM | POA: Diagnosis not present

## 2016-10-20 DIAGNOSIS — E1122 Type 2 diabetes mellitus with diabetic chronic kidney disease: Secondary | ICD-10-CM | POA: Diagnosis not present

## 2016-10-20 DIAGNOSIS — N182 Chronic kidney disease, stage 2 (mild): Secondary | ICD-10-CM | POA: Diagnosis not present

## 2016-10-20 DIAGNOSIS — K5732 Diverticulitis of large intestine without perforation or abscess without bleeding: Secondary | ICD-10-CM | POA: Diagnosis not present

## 2016-10-20 DIAGNOSIS — I129 Hypertensive chronic kidney disease with stage 1 through stage 4 chronic kidney disease, or unspecified chronic kidney disease: Secondary | ICD-10-CM | POA: Diagnosis not present

## 2016-12-31 DIAGNOSIS — E119 Type 2 diabetes mellitus without complications: Secondary | ICD-10-CM | POA: Diagnosis not present

## 2017-01-20 DIAGNOSIS — N182 Chronic kidney disease, stage 2 (mild): Secondary | ICD-10-CM | POA: Diagnosis not present

## 2017-01-20 DIAGNOSIS — I129 Hypertensive chronic kidney disease with stage 1 through stage 4 chronic kidney disease, or unspecified chronic kidney disease: Secondary | ICD-10-CM | POA: Diagnosis not present

## 2017-01-20 DIAGNOSIS — E1122 Type 2 diabetes mellitus with diabetic chronic kidney disease: Secondary | ICD-10-CM | POA: Diagnosis not present

## 2017-01-20 DIAGNOSIS — N08 Glomerular disorders in diseases classified elsewhere: Secondary | ICD-10-CM | POA: Diagnosis not present

## 2017-03-07 DIAGNOSIS — E119 Type 2 diabetes mellitus without complications: Secondary | ICD-10-CM | POA: Diagnosis not present

## 2017-04-19 ENCOUNTER — Other Ambulatory Visit: Payer: Self-pay | Admitting: Internal Medicine

## 2017-04-19 DIAGNOSIS — Z139 Encounter for screening, unspecified: Secondary | ICD-10-CM

## 2017-05-23 DIAGNOSIS — I129 Hypertensive chronic kidney disease with stage 1 through stage 4 chronic kidney disease, or unspecified chronic kidney disease: Secondary | ICD-10-CM | POA: Diagnosis not present

## 2017-05-23 DIAGNOSIS — N08 Glomerular disorders in diseases classified elsewhere: Secondary | ICD-10-CM | POA: Diagnosis not present

## 2017-05-23 DIAGNOSIS — N182 Chronic kidney disease, stage 2 (mild): Secondary | ICD-10-CM | POA: Diagnosis not present

## 2017-05-23 DIAGNOSIS — E1122 Type 2 diabetes mellitus with diabetic chronic kidney disease: Secondary | ICD-10-CM | POA: Diagnosis not present

## 2017-05-30 ENCOUNTER — Ambulatory Visit: Payer: Medicare PPO

## 2017-06-03 DIAGNOSIS — N08 Glomerular disorders in diseases classified elsewhere: Secondary | ICD-10-CM | POA: Diagnosis not present

## 2017-06-03 DIAGNOSIS — N182 Chronic kidney disease, stage 2 (mild): Secondary | ICD-10-CM | POA: Diagnosis not present

## 2017-06-03 DIAGNOSIS — R05 Cough: Secondary | ICD-10-CM | POA: Diagnosis not present

## 2017-06-03 DIAGNOSIS — E1122 Type 2 diabetes mellitus with diabetic chronic kidney disease: Secondary | ICD-10-CM | POA: Diagnosis not present

## 2017-06-15 ENCOUNTER — Other Ambulatory Visit: Payer: Self-pay | Admitting: Internal Medicine

## 2017-06-15 ENCOUNTER — Ambulatory Visit
Admission: RE | Admit: 2017-06-15 | Discharge: 2017-06-15 | Disposition: A | Discharge status: Home / Self Care | Payer: Medicare PPO | Source: Ambulatory Visit | Attending: Internal Medicine | Admitting: Internal Medicine

## 2017-06-15 DIAGNOSIS — R350 Frequency of micturition: Secondary | ICD-10-CM | POA: Diagnosis not present

## 2017-06-15 DIAGNOSIS — N182 Chronic kidney disease, stage 2 (mild): Secondary | ICD-10-CM | POA: Diagnosis not present

## 2017-06-15 DIAGNOSIS — I129 Hypertensive chronic kidney disease with stage 1 through stage 4 chronic kidney disease, or unspecified chronic kidney disease: Secondary | ICD-10-CM | POA: Diagnosis not present

## 2017-06-15 DIAGNOSIS — R52 Pain, unspecified: Secondary | ICD-10-CM

## 2017-06-15 DIAGNOSIS — R109 Unspecified abdominal pain: Secondary | ICD-10-CM | POA: Diagnosis not present

## 2017-06-28 DIAGNOSIS — E119 Type 2 diabetes mellitus without complications: Secondary | ICD-10-CM | POA: Diagnosis not present

## 2017-09-13 ENCOUNTER — Other Ambulatory Visit: Payer: Self-pay | Admitting: Gastroenterology

## 2017-09-13 DIAGNOSIS — R1032 Left lower quadrant pain: Secondary | ICD-10-CM

## 2017-09-19 ENCOUNTER — Ambulatory Visit (HOSPITAL_COMMUNITY)
Admission: RE | Admit: 2017-09-19 | Discharge: 2017-09-19 | Disposition: A | Discharge status: Home / Self Care | Payer: Medicare Other | Source: Ambulatory Visit | Attending: Gastroenterology | Admitting: Gastroenterology

## 2017-09-19 DIAGNOSIS — K5730 Diverticulosis of large intestine without perforation or abscess without bleeding: Secondary | ICD-10-CM | POA: Insufficient documentation

## 2017-09-19 DIAGNOSIS — I700 Atherosclerosis of aorta: Secondary | ICD-10-CM | POA: Diagnosis not present

## 2017-09-19 DIAGNOSIS — Z90710 Acquired absence of both cervix and uterus: Secondary | ICD-10-CM | POA: Diagnosis not present

## 2017-09-19 DIAGNOSIS — R1032 Left lower quadrant pain: Secondary | ICD-10-CM | POA: Diagnosis not present

## 2017-09-19 LAB — POCT I-STAT CREATININE: CREATININE: 0.9 mg/dL (ref 0.44–1.00)

## 2017-09-19 MED ORDER — IOPAMIDOL (ISOVUE-300) INJECTION 61%
100.0000 mL | Freq: Once | INTRAVENOUS | Status: AC | PRN
  Administered 2017-09-19: 100 mL via INTRAVENOUS

## 2017-09-19 MED ORDER — IOPAMIDOL (ISOVUE-300) INJECTION 61%
INTRAVENOUS | Status: AC
  Filled 2017-09-19: qty 100

## 2017-10-05 ENCOUNTER — Ambulatory Visit: Payer: Medicare Other

## 2017-10-06 ENCOUNTER — Ambulatory Visit
Admission: RE | Admit: 2017-10-06 | Discharge: 2017-10-06 | Disposition: A | Discharge status: Home / Self Care | Payer: Medicare Other | Source: Ambulatory Visit | Attending: Internal Medicine | Admitting: Internal Medicine

## 2017-10-06 DIAGNOSIS — Z139 Encounter for screening, unspecified: Secondary | ICD-10-CM

## 2017-10-12 LAB — HM COLONOSCOPY

## 2017-11-08 ENCOUNTER — Other Ambulatory Visit: Payer: Self-pay | Admitting: Internal Medicine

## 2017-11-16 ENCOUNTER — Other Ambulatory Visit: Payer: Self-pay | Admitting: Internal Medicine

## 2017-11-16 IMAGING — CR DG CHEST 2V
2 series · 2 of 2 positions shown · non-contrast
Comparison: PA and lateral chest 11/20/2013 and 02/23/2012.

CLINICAL DATA: Productive cough and shortness of breath since
02/12/2015. Initial encounter.

EXAM:
CHEST  2 VIEW

[w chest pa]
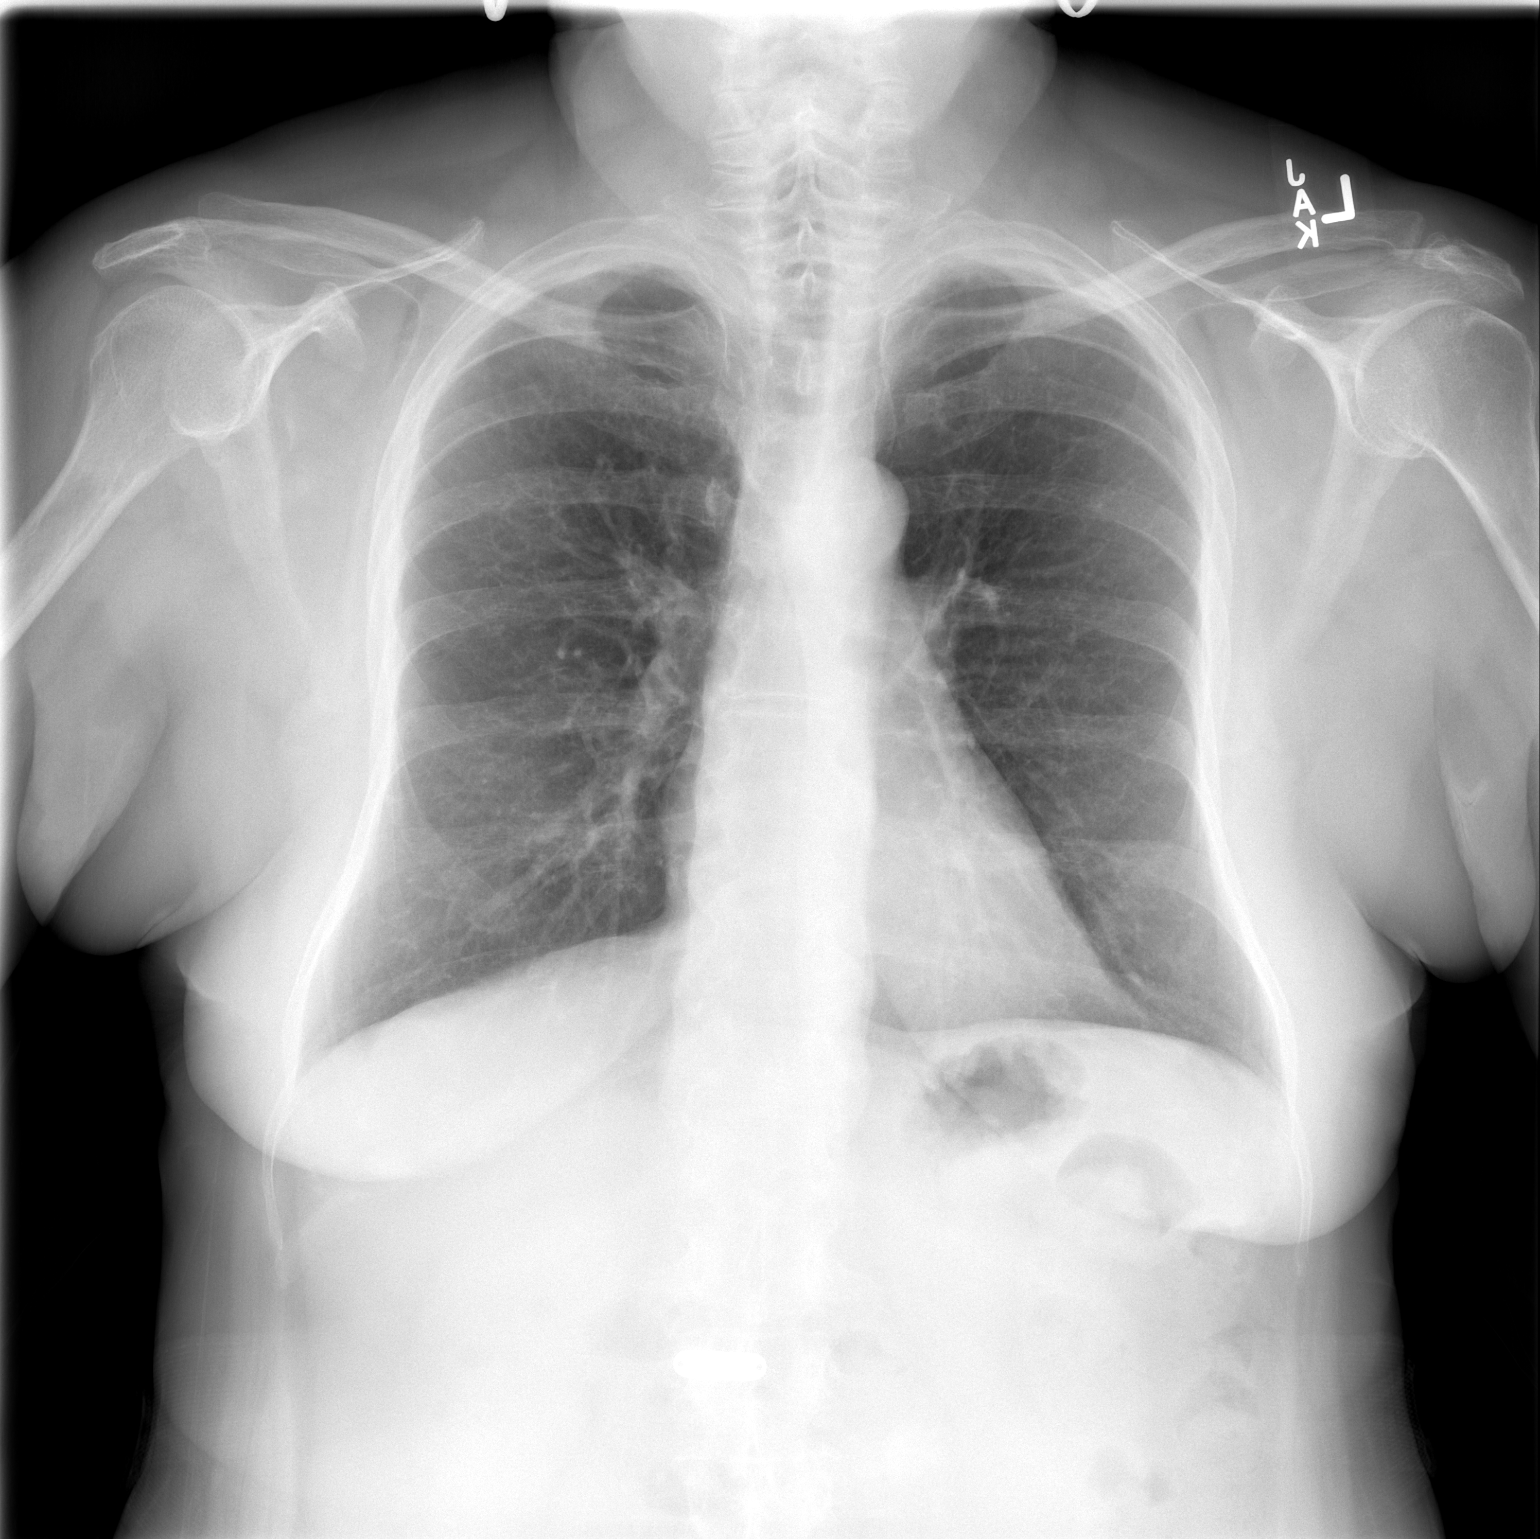

[w chest lat]
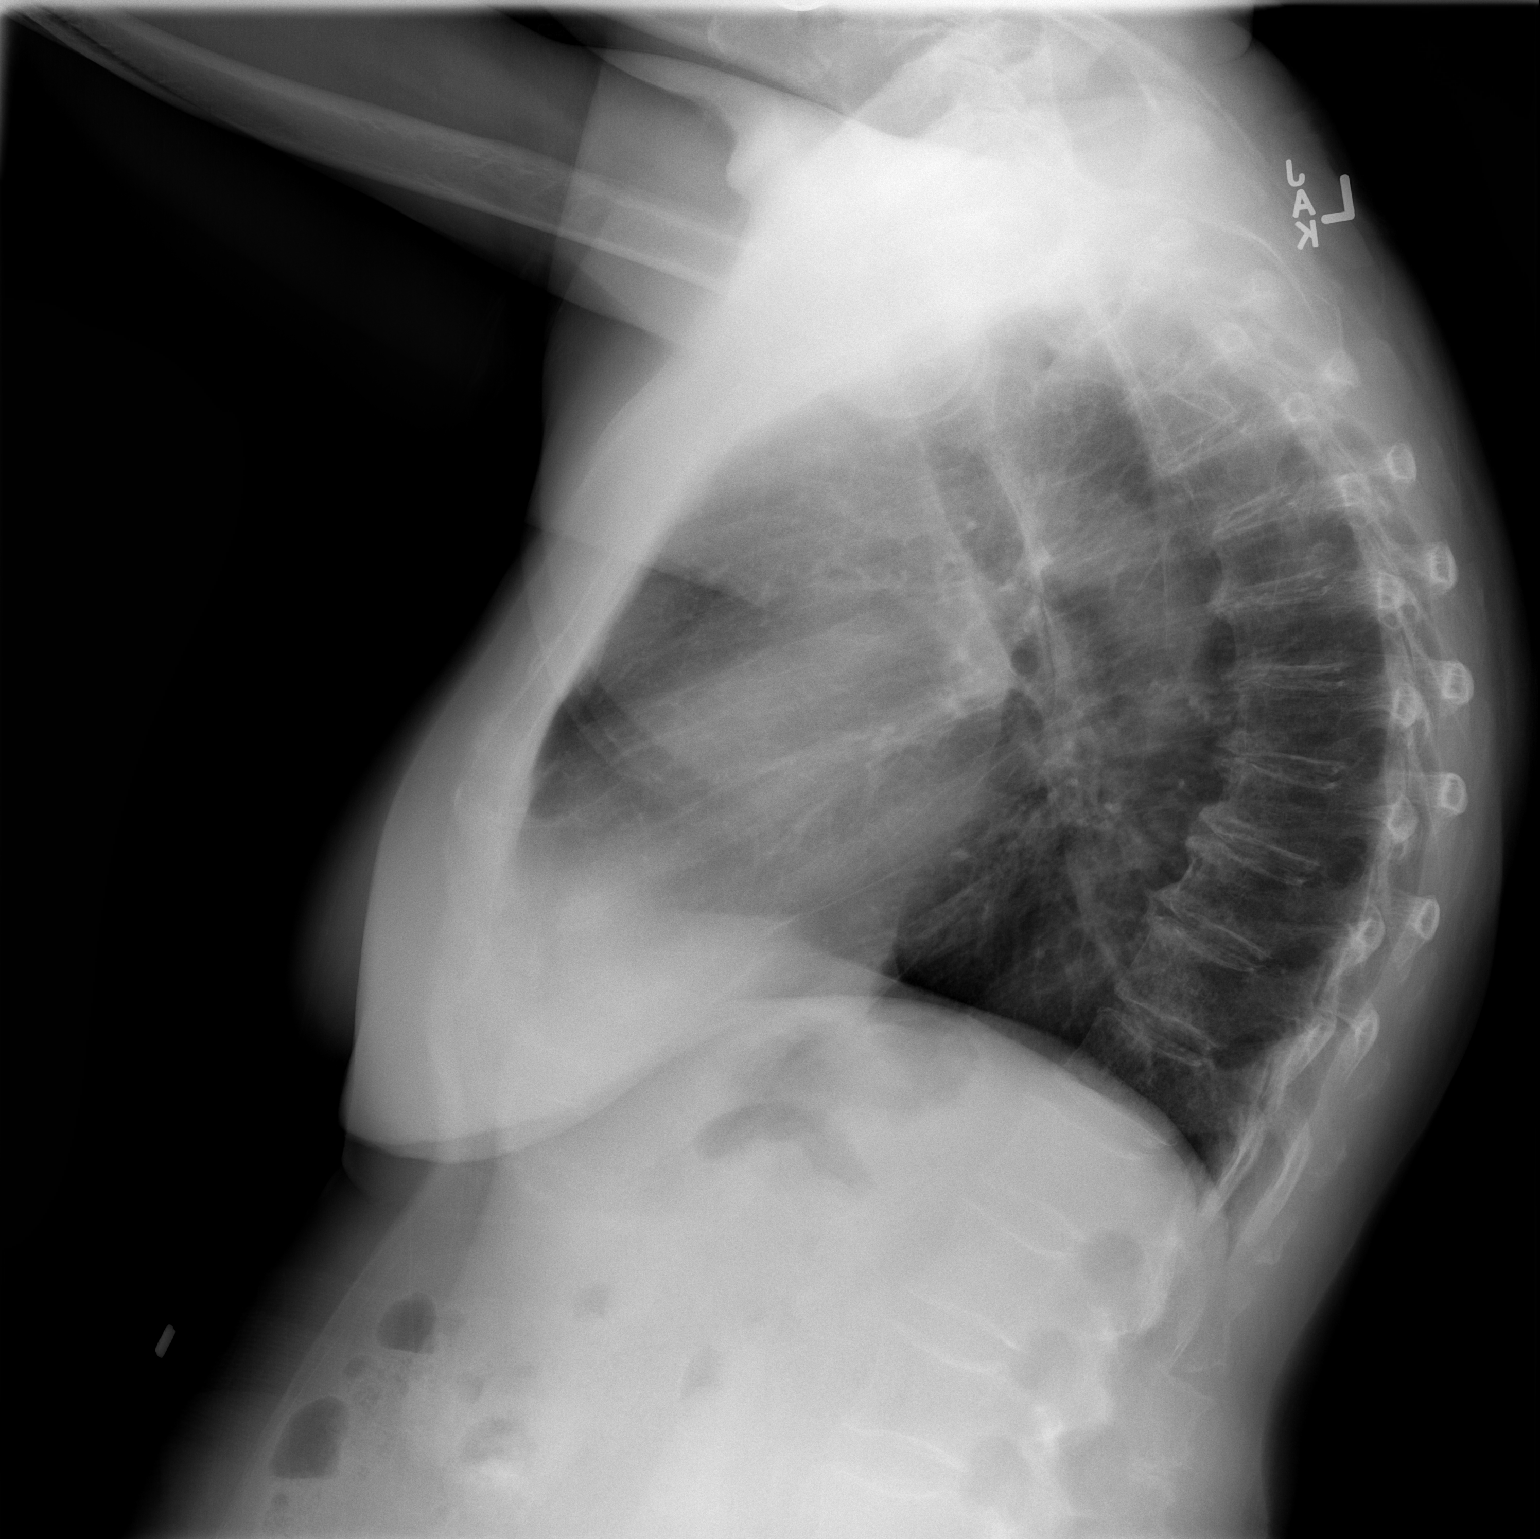

[2 of 2 positions shown; findings below may reference images not displayed]

FINDINGS: The lungs are clear. Heart size is normal. No pneumothorax or
pleural effusion. Thoracic spondylosis noted.
IMPRESSION: No acute disease.

## 2017-12-22 ENCOUNTER — Ambulatory Visit: Payer: Medicare Other | Admitting: Internal Medicine

## 2017-12-22 ENCOUNTER — Ambulatory Visit (INDEPENDENT_AMBULATORY_CARE_PROVIDER_SITE_OTHER): Payer: Medicare Other | Admitting: Internal Medicine

## 2017-12-22 ENCOUNTER — Encounter: Payer: Self-pay | Admitting: Internal Medicine

## 2017-12-22 VITALS — BP 110/58 | HR 73 | Temp 98.1°F | Ht 63.0 in | Wt 147.4 lb

## 2017-12-22 DIAGNOSIS — I1310 Hypertensive heart and chronic kidney disease without heart failure, with stage 1 through stage 4 chronic kidney disease, or unspecified chronic kidney disease: Secondary | ICD-10-CM | POA: Insufficient documentation

## 2017-12-22 DIAGNOSIS — Z7982 Long term (current) use of aspirin: Secondary | ICD-10-CM

## 2017-12-22 DIAGNOSIS — E1122 Type 2 diabetes mellitus with diabetic chronic kidney disease: Secondary | ICD-10-CM

## 2017-12-22 DIAGNOSIS — I129 Hypertensive chronic kidney disease with stage 1 through stage 4 chronic kidney disease, or unspecified chronic kidney disease: Secondary | ICD-10-CM | POA: Diagnosis not present

## 2017-12-22 DIAGNOSIS — L309 Dermatitis, unspecified: Secondary | ICD-10-CM | POA: Diagnosis not present

## 2017-12-22 DIAGNOSIS — N182 Chronic kidney disease, stage 2 (mild): Secondary | ICD-10-CM

## 2017-12-22 NOTE — Patient Instructions (Signed)

## 2017-12-23 LAB — CMP14+EGFR
ALBUMIN: 4.2 g/dL (ref 3.5–4.8)
ALK PHOS: 57 IU/L (ref 39–117)
ALT: 23 IU/L (ref 0–32)
AST: 26 IU/L (ref 0–40)
Albumin/Globulin Ratio: 1.8 (ref 1.2–2.2)
BILIRUBIN TOTAL: 0.3 mg/dL (ref 0.0–1.2)
BUN / CREAT RATIO: 9 — AB (ref 12–28)
BUN: 8 mg/dL (ref 8–27)
CHLORIDE: 98 mmol/L (ref 96–106)
CO2: 28 mmol/L (ref 20–29)
CREATININE: 0.9 mg/dL (ref 0.57–1.00)
Calcium: 9.8 mg/dL (ref 8.7–10.3)
GFR calc Af Amer: 71 mL/min/{1.73_m2} (ref >59)
GFR calc non Af Amer: 61 mL/min/{1.73_m2} (ref >59)
GLOBULIN, TOTAL: 2.3 g/dL (ref 1.5–4.5)
GLUCOSE: 95 mg/dL (ref 65–99)
Potassium: 4.3 mmol/L (ref 3.5–5.2)
SODIUM: 141 mmol/L (ref 134–144)
TOTAL PROTEIN: 6.5 g/dL (ref 6.0–8.5)

## 2017-12-23 LAB — HEMOGLOBIN A1C
Est. average glucose Bld gHb Est-mCnc: 126 mg/dL
HEMOGLOBIN A1C: 6 % — AB (ref 4.8–5.6)

## 2017-12-24 NOTE — Progress Notes (Signed)
Your kidney fxn is stable. Your liver fxn is nl. Your hba1c is great at 6.0. Happy holidays!

## 2017-12-26 ENCOUNTER — Encounter: Payer: Self-pay | Admitting: Internal Medicine

## 2017-12-26 NOTE — Progress Notes (Signed)
Subjective:     Patient ID: Vickie Brown , female    DOB: [birth year] , 77 y.o.   MRN: [id number]   Chief Complaint  Patient presents with  . Diabetes  . Hypertension    HPI  Diabetes  She presents for her follow-up diabetic visit. She has type 2 diabetes mellitus. Her disease course has been stable. There are no hypoglycemic associated symptoms. Pertinent negatives for hypoglycemia include no headaches. Pertinent negatives for diabetes include no blurred vision and no chest pain. There are no hypoglycemic complications. Risk factors for coronary artery disease include diabetes mellitus, dyslipidemia, hypertension and post-menopausal. She is following a diabetic diet. Her breakfast blood glucose is taken between 8-9 am. Her breakfast blood glucose range is generally 90-110 mg/dl.  Hypertension  This is a chronic problem. The current episode started more than 1 year ago. The problem has been gradually improving since onset. Pertinent negatives include no blurred vision, chest pain, headaches, palpitations or shortness of breath.   She reports compliance with meds.   Past Medical History:  Diagnosis Date  . Chronic headaches   . Diverticulitis   . Hyperlipidemia   . Knee pain   . Type 2 diabetes mellitus (HCC)      Family History  Problem Relation Age of Onset  . Cervical cancer Mother   . Cancer Mother   . Diabetes Father   . Diabetes Sister   . Diabetes Brother   . Breast cancer Neg Hx      Current Outpatient Medications:  .  aspirin 81 MG tablet, Take 81 mg by mouth every evening. , Disp: , Rfl:  .  Cholecalciferol (VITAMIN D3) 2000 UNITS TABS, Take 1 tablet by mouth daily.  , Disp: , Rfl:  .  clobetasol cream (TEMOVATE) 0.05 %, apply A THIN LAYER TO AFFECTED AREAS 2 TIMES DAILY, Disp: , Rfl: 0 .  fluticasone (FLONASE) 50 MCG/ACT nasal spray, Place 1 spray into both nostrils at bedtime., Disp: 16 g, Rfl: 2 .  JANUVIA 100 MG tablet, TAKE 1 TABLET BY MOUTH ONCE DAILY,  Disp: 90 tablet, Rfl: 0 .  losartan (COZAAR) 50 MG tablet, Take 1 tablet by mouth Daily., Disp: , Rfl:  .  Multiple Vitamins-Minerals (ALIVE ONCE DAILY WOMENS 50+ PO), Take 1 tablet by mouth daily., Disp: , Rfl:  .  Omega-3 Fatty Acids (FISH OIL PO), Take by mouth., Disp: , Rfl:  .  pravastatin (PRAVACHOL) 40 MG tablet, TAKE 1 TABLET BY MOUTH ONCE DAILY, Disp: 90 tablet, Rfl: 0 .  Probiotic Product (ALIGN) 4 MG CAPS, Take 4 mg by mouth daily., Disp: , Rfl:  .  TURMERIC PO, Take by mouth., Disp: , Rfl:  .  esomeprazole (NEXIUM) 40 MG capsule, Take 40 mg by mouth daily before breakfast.  , Disp: , Rfl:    No Known Allergies   Review of Systems  Constitutional: Negative.   Eyes: Negative for blurred vision.  Respiratory: Negative.  Negative for shortness of breath.   Cardiovascular: Negative.  Negative for chest pain and palpitations.  Gastrointestinal: Negative.   Skin: Positive for rash (she c/o generalized rash on her arms and chest. started a week ago or so. started to use triamcinolone cream, dry and scaly rash).  Neurological: Negative.  Negative for headaches.  Psychiatric/Behavioral: Negative.      Today's Vitals   12/22/17 1437  BP: (!) 110/58  Pulse: 73  Temp: 98.1 F (36.7 C)  TempSrc: Oral  Weight: 147 lb 6.4 oz (  66.9 kg)  Height: 5' 3"  (1.6 m)  PainSc: 7   PainLoc: Knee   Body mass index is 26.11 kg/m.   Objective:  Physical Exam Vitals signs and nursing note reviewed.  Constitutional:      Appearance: Normal appearance.  HENT:     Head: Normocephalic and atraumatic.  Cardiovascular:     Rate and Rhythm: Normal rate and regular rhythm.     Heart sounds: Normal heart sounds.  Pulmonary:     Effort: Pulmonary effort is normal.     Breath sounds: Normal breath sounds.  Skin:    Findings: Rash present.     Comments: Scattered dry, sl. Erythematous patches. On b/l UE and anterior chest. No vesicular lesions noted.   Neurological:     Mental Status: She is  alert.         Assessment And Plan:     1. Type 2 diabetes mellitus with stage 2 chronic kidney disease, without long-term current use of insulin ([place])  I will check labs as listed below. She is encouraged to avoid sugary beverages.   - CMP14+EGFR - Hemoglobin A1c  2. Chronic renal disease, stage II  Chronic. She is encouraged to stay well hydrated.   3. Hypertensive nephropathy  Well controlled. She will continue with current meds. She is encouraged to avoid adding salt to her foods.   4. Dermatitis  Her sx are suggestive of pityriasis rosea.  She will continue with use of triamcinolone cream. Pt advised rash should resolve over the next week. She will let me know if her sx persist.   Maximino Greenland, MD

## 2018-01-19 ENCOUNTER — Other Ambulatory Visit: Payer: Self-pay | Admitting: Internal Medicine

## 2018-01-26 ENCOUNTER — Ambulatory Visit
Admission: RE | Admit: 2018-01-26 | Discharge: 2018-01-26 | Disposition: A | Discharge status: Home / Self Care | Payer: Medicare Other | Source: Ambulatory Visit | Attending: Internal Medicine | Admitting: Internal Medicine

## 2018-01-26 ENCOUNTER — Encounter: Payer: Self-pay | Admitting: Internal Medicine

## 2018-01-26 ENCOUNTER — Ambulatory Visit (INDEPENDENT_AMBULATORY_CARE_PROVIDER_SITE_OTHER): Payer: Medicare Other | Admitting: Internal Medicine

## 2018-01-26 ENCOUNTER — Other Ambulatory Visit: Payer: Self-pay

## 2018-01-26 VITALS — BP 106/58 | HR 72 | Temp 97.8°F | Ht 60.2 in | Wt 147.0 lb

## 2018-01-26 DIAGNOSIS — M791 Myalgia, unspecified site: Secondary | ICD-10-CM

## 2018-01-26 DIAGNOSIS — N182 Chronic kidney disease, stage 2 (mild): Secondary | ICD-10-CM

## 2018-01-26 DIAGNOSIS — R61 Generalized hyperhidrosis: Secondary | ICD-10-CM

## 2018-01-26 DIAGNOSIS — R05 Cough: Secondary | ICD-10-CM | POA: Diagnosis not present

## 2018-01-26 DIAGNOSIS — R6889 Other general symptoms and signs: Secondary | ICD-10-CM | POA: Diagnosis not present

## 2018-01-26 DIAGNOSIS — R062 Wheezing: Secondary | ICD-10-CM | POA: Diagnosis not present

## 2018-01-26 DIAGNOSIS — E1122 Type 2 diabetes mellitus with diabetic chronic kidney disease: Secondary | ICD-10-CM

## 2018-01-26 DIAGNOSIS — R51 Headache: Secondary | ICD-10-CM

## 2018-01-26 DIAGNOSIS — R059 Cough, unspecified: Secondary | ICD-10-CM

## 2018-01-26 LAB — POCT INFLUENZA A/B
INFLUENZA A, POC: NEGATIVE
Influenza B, POC: NEGATIVE

## 2018-01-26 MED ORDER — IPRATROPIUM-ALBUTEROL 0.5-2.5 (3) MG/3ML IN SOLN: 3.0000 mL | Freq: Once | RESPIRATORY_TRACT | Status: DC

## 2018-01-26 MED ORDER — OSELTAMIVIR PHOSPHATE 75 MG PO CAPS: 75.0000 mg | ORAL_CAPSULE | Freq: Two times a day (BID) | ORAL | 0 refills | Status: AC

## 2018-01-26 NOTE — Patient Instructions (Addendum)
YOUR BLOOD PRESSURE IS A LITTLE LOW TODAY AND COULD BE YOU ARE GETTING DEHYDRATED. PUSH MORE FLUIDS DOWN.     Im concerned you are having the flu and I need to rule out pneumonia.  I will call you when I see the chest xray back.   Influenza, Adult Influenza is also called "the flu." It is an infection in the lungs, nose, and throat (respiratory tract). It is caused by a virus. The flu causes symptoms that are similar to symptoms of a cold. It also causes a high fever and body aches. The flu spreads easily from person to person (is contagious). Getting a flu shot (influenza vaccination) every year is the best way to prevent the flu. What are the causes? This condition is caused by the influenza virus. You can get the virus by:  Breathing in droplets that are in the air from the cough or sneeze of a person who has the virus.  Touching something that has the virus on it (is contaminated) and then touching your mouth, nose, or eyes. What increases the risk? Certain things may make you more likely to get the flu. These include:  Not washing your hands often.  Having close contact with many people during cold and flu season.  Touching your mouth, eyes, or nose without first washing your hands.  Not getting a flu shot every year. You may have a higher risk for the flu, along with serious problems such as a lung infection (pneumonia), if you:  Are older than 65.  Are pregnant.  Have a weakened disease-fighting system (immune system) because of a disease or taking certain medicines.  Have a long-term (chronic) illness, such as: ? Heart, kidney, or lung disease. ? Diabetes. ? Asthma.  Have a liver disorder.  Are very overweight (morbidly obese).  Have anemia. This is a condition that affects your red blood cells. What are the signs or symptoms? Symptoms usually begin suddenly and last 4-14 days. They may include:  Fever and chills.  Headaches, body aches, or muscle  aches.  Sore throat.  Cough.  Runny or stuffy (congested) nose.  Chest discomfort.  Not wanting to eat as much as normal (poor appetite).  Weakness or feeling tired (fatigue).  Dizziness.  Feeling sick to your stomach (nauseous) or throwing up (vomiting). How is this treated? If the flu is found early, you can be treated with medicine that can help reduce how bad the illness is and how long it lasts (antiviral medicine). This may be given by mouth (orally) or through an IV tube. Taking care of yourself at home can help your symptoms get better. Your doctor may suggest:  Taking over-the-counter medicines.  Drinking plenty of fluids. The flu often goes away on its own. If you have very bad symptoms or other problems, you may be treated in a hospital. Follow these instructions at home:     Activity  Rest as needed. Get plenty of sleep.  Stay home from work or school as told by your doctor. ? Do not leave home until you do not have a fever for 24 hours without taking medicine. ? Leave home only to visit your doctor. Eating and drinking  Take an ORS (oral rehydration solution). This is a drink that is sold at pharmacies and stores.  Drink enough fluid to keep your pee (urine) pale yellow.  Drink clear fluids in small amounts as you are able. Clear fluids include: ? Water. ? Ice chips. ? Fruit juice  that has water added (diluted fruit juice). ? Low-calorie sports drinks.  Eat bland, easy-to-digest foods in small amounts as you are able. These foods include: ? Bananas. ? Applesauce. ? Rice. ? Lean meats. ? Toast. ? Crackers.  Do not eat or drink: ? Fluids that have a lot of sugar or caffeine. ? Alcohol. ? Spicy or fatty foods. General instructions  Take over-the-counter and prescription medicines only as told by your doctor.  Use a cool mist humidifier to add moisture to the air in your home. This can make it easier for you to breathe.  Cover your mouth and  nose when you cough or sneeze.  Wash your hands with soap and water often, especially after you cough or sneeze. If you cannot use soap and water, use alcohol-based hand sanitizer.  Keep all follow-up visits as told by your doctor. This is important. How is this prevented?   Get a flu shot every year. You may get the flu shot in late summer, fall, or winter. Ask your doctor when you should get your flu shot.  Avoid contact with people who are sick during fall and winter (cold and flu season). Contact a doctor if:  You get new symptoms.  You have: ? Chest pain. ? Watery poop (diarrhea). ? A fever.  Your cough gets worse.  You start to have more mucus.  You feel sick to your stomach.  You throw up. Get help right away if you:  Have shortness of breath.  Have trouble breathing.  Have skin or nails that turn a bluish color.  Have very bad pain or stiffness in your neck.  Get a sudden headache.  Get sudden pain in your face or ear.  Cannot eat or drink without throwing up. Summary  Influenza ("the flu") is an infection in the lungs, nose, and throat. It is caused by a virus.  Take over-the-counter and prescription medicines only as told by your doctor.  Getting a flu shot every year is the best way to avoid getting the flu. This information is not intended to replace advice given to you by your health care provider. Make sure you discuss any questions you have with your health care provider. Document Released: 10/07/2007 Document Revised: 06/15/2017 Document Reviewed: 06/15/2017 Elsevier Interactive Patient Education  2019 [employer].

## 2018-01-26 NOTE — Progress Notes (Addendum)
Subjective:    *  Patient ID: Vickie Brown , female    DOB: 1940-04-12 , 78 y.o.   MRN: [id number]   Chief Complaint  Patient presents with  . Cough    started tuesday, clammy, chills, achy, previously had flu and pneu vacc    HPI  Monday night felt a scratchy throat and took chlorasitan the next day since she was having nose congestion and cough. Has body aches and HA, which is provoked with coughing. Feels worse today. Has had flu shot. Has been wheezing at night time.   Past Medical History:  Diagnosis Date  . Chronic headaches   . Diverticulitis   . Hyperlipidemia   . Knee pain   . Type 2 diabetes mellitus (HCC)      Family History  Problem Relation Age of Onset  . Cervical cancer Mother   . Cancer Mother   . Diabetes Father   . Diabetes Sister   . Diabetes Brother   . Breast cancer Neg Hx      Current Outpatient Medications:  .  aspirin 81 MG tablet, Take 81 mg by mouth every evening. , Disp: , Rfl:  .  Cholecalciferol (VITAMIN D3) 2000 UNITS TABS, Take 1 tablet by mouth daily.  , Disp: , Rfl:  .  clobetasol cream (TEMOVATE) 0.05 %, apply A THIN LAYER TO AFFECTED AREAS 2 TIMES DAILY, Disp: , Rfl: 0 .  esomeprazole (NEXIUM) 40 MG capsule, Take 40 mg by mouth daily before breakfast.  , Disp: , Rfl:  .  fluticasone (FLONASE) 50 MCG/ACT nasal spray, Place 1 spray into both nostrils at bedtime., Disp: 16 g, Rfl: 2 .  JANUVIA 100 MG tablet, TAKE 1 TABLET BY MOUTH ONCE DAILY, Disp: 90 tablet, Rfl: 0 .  losartan (COZAAR) 50 MG tablet, TAKE 1 TABLET BY MOUTH EVERY DAY, Disp: 90 tablet, Rfl: 1 .  Multiple Vitamins-Minerals (ALIVE ONCE DAILY WOMENS 50+ PO), Take 1 tablet by mouth daily., Disp: , Rfl:  .  Omega-3 Fatty Acids (FISH OIL PO), Take by mouth., Disp: , Rfl:  .  pravastatin (PRAVACHOL) 40 MG tablet, TAKE 1 TABLET BY MOUTH ONCE DAILY, Disp: 90 tablet, Rfl: 0 .  Probiotic Product (ALIGN) 4 MG CAPS, Take 4 mg by mouth daily., Disp: , Rfl:  .  TURMERIC PO, Take by  mouth., Disp: , Rfl:    No Known Allergies   Review of Systems  Constitutional: Positive for appetite change, chills, diaphoresis and fatigue.  HENT: Positive for congestion, postnasal drip and rhinorrhea. Negative for sore throat.   Eyes: Negative for discharge.  Respiratory: Positive for cough and wheezing. Negative for chest tightness and shortness of breath.   Cardiovascular: Negative for chest pain.  Gastrointestinal: Negative for diarrhea, nausea and vomiting.  Genitourinary: Negative for difficulty urinating.  Musculoskeletal: Positive for myalgias.  Skin: Negative for rash.  Neurological: Positive for headaches.    Today's Vitals   01/26/18 1131  BP: (!) 106/58  Pulse: 72  Temp: 97.8 F (36.6 C)  TempSrc: Oral  SpO2: 94%  Weight: 147 lb (66.7 kg)  Height: 5' 0.2" (1.529 m)   Body mass index is 28.52 kg/m.   Objective:  Physical Exam Vitals signs and nursing note reviewed.  Constitutional:      Appearance: She is ill-appearing. She is not toxic-appearing.  HENT:     Head: Normocephalic.     Right Ear: Tympanic membrane and ear canal normal.     Left Ear: Tympanic membrane  and ear canal normal.     Nose: Congestion and rhinorrhea present.     Mouth/Throat:     Mouth: Mucous membranes are moist.     Pharynx: Oropharynx is clear.  Eyes:     General: No scleral icterus.       Right eye: No discharge.        Left eye: No discharge.     Conjunctiva/sclera: Conjunctivae normal.  Neck:     Musculoskeletal: Neck supple. No neck rigidity or muscular tenderness.  Cardiovascular:     Rate and Rhythm: Normal rate and regular rhythm.  Pulmonary:     Effort: Pulmonary effort is normal.     Breath sounds: No wheezing.  Musculoskeletal:        General: No deformity.  Lymphadenopathy:     Cervical: No cervical adenopathy.  Skin:    General: Skin is warm and dry.     Findings: No rash.  Neurological:     Mental Status: She is alert and oriented to person, place, and  time.     Motor: No weakness.     Gait: Gait normal.  Psychiatric:        Mood and Affect: Mood normal.        Thought Content: Thought content normal.        Judgment: Judgment normal.    Post duoneb Pulse Ox 98%  Flu test A&B- negative.  Assessment And Plan:   1. Diaphoresis- acute, could be she is braking a fever.  - DG Chest 2 View; Future I will call her when the results are back.   2. Wheezing- acute - ipratropium-albuterol (DUONEB) 0.5-2.5 (3) MG/3ML nebulizer solution 3 mL  3. flu like symptoms- acute. I went ahead and placed her on Tamiflu 75 mg bid x 5 days - POCT Urinalysis Dipstick (81002)

## 2018-02-02 ENCOUNTER — Telehealth: Payer: Self-pay | Admitting: Internal Medicine

## 2018-02-02 NOTE — Telephone Encounter (Signed)
Spoke with pt and informed her that the provider would like for her to be re-evaluated here at the office, she verbalized understanding and the front desk will be giving her a call to schedule for 02/03/2018. She verbalized understanding.

## 2018-02-05 ENCOUNTER — Other Ambulatory Visit: Payer: Self-pay | Admitting: Internal Medicine

## 2018-02-16 ENCOUNTER — Other Ambulatory Visit: Payer: Self-pay | Admitting: Internal Medicine

## 2018-04-24 ENCOUNTER — Ambulatory Visit (INDEPENDENT_AMBULATORY_CARE_PROVIDER_SITE_OTHER): Payer: Medicare Other | Admitting: Internal Medicine

## 2018-04-24 ENCOUNTER — Encounter: Payer: Self-pay | Admitting: Internal Medicine

## 2018-04-24 ENCOUNTER — Other Ambulatory Visit: Payer: Self-pay

## 2018-04-24 VITALS — BP 112/68 | HR 84 | Temp 98.7°F | Ht 60.2 in | Wt 151.4 lb

## 2018-04-24 DIAGNOSIS — N182 Chronic kidney disease, stage 2 (mild): Secondary | ICD-10-CM | POA: Diagnosis not present

## 2018-04-24 DIAGNOSIS — E7800 Pure hypercholesterolemia, unspecified: Secondary | ICD-10-CM | POA: Diagnosis not present

## 2018-04-24 DIAGNOSIS — E1122 Type 2 diabetes mellitus with diabetic chronic kidney disease: Secondary | ICD-10-CM

## 2018-04-24 DIAGNOSIS — I129 Hypertensive chronic kidney disease with stage 1 through stage 4 chronic kidney disease, or unspecified chronic kidney disease: Secondary | ICD-10-CM | POA: Diagnosis not present

## 2018-04-24 DIAGNOSIS — M25561 Pain in right knee: Secondary | ICD-10-CM

## 2018-04-24 MED ORDER — DICLOFENAC SODIUM 1 % TD GEL: 2.0000 g | Freq: Four times a day (QID) | TRANSDERMAL | 1 refills | Status: DC

## 2018-04-24 NOTE — Patient Instructions (Signed)
Diabetes Mellitus and Nutrition, Adult  When you have diabetes (diabetes mellitus), it is very important to have healthy eating habits because your blood sugar (glucose) levels are greatly affected by what you eat and drink. Eating healthy foods in the appropriate amounts, at about the same times every day, can help you:  · Control your blood glucose.  · Lower your risk of heart disease.  · Improve your blood pressure.  · Reach or maintain a healthy weight.  Every person with diabetes is different, and each person has different needs for a meal plan. Your health care provider may recommend that you work with a diet and nutrition specialist (dietitian) to make a meal plan that is best for you. Your meal plan may vary depending on factors such as:  · The calories you need.  · The medicines you take.  · Your weight.  · Your blood glucose, blood pressure, and cholesterol levels.  · Your activity level.  · Other health conditions you have, such as heart or kidney disease.  How do carbohydrates affect me?  Carbohydrates, also called carbs, affect your blood glucose level more than any other type of food. Eating carbs naturally raises the amount of glucose in your blood. Carb counting is a method for keeping track of how many carbs you eat. Counting carbs is important to keep your blood glucose at a healthy level, especially if you use insulin or take certain oral diabetes medicines.  It is important to know how many carbs you can safely have in each meal. This is different for every person. Your dietitian can help you calculate how many carbs you should have at each meal and for each snack.  Foods that contain carbs include:  · Bread, cereal, rice, pasta, and crackers.  · Potatoes and corn.  · Peas, beans, and lentils.  · Milk and yogurt.  · Fruit and juice.  · Desserts, such as cakes, cookies, ice cream, and candy.  How does alcohol affect me?  Alcohol can cause a sudden decrease in blood glucose (hypoglycemia),  especially if you use insulin or take certain oral diabetes medicines. Hypoglycemia can be a life-threatening condition. Symptoms of hypoglycemia (sleepiness, dizziness, and confusion) are similar to symptoms of having too much alcohol.  If your health care provider says that alcohol is safe for you, follow these guidelines:  · Limit alcohol intake to no more than 1 drink per day for nonpregnant women and 2 drinks per day for men. One drink equals 12 oz of beer, 5 oz of wine, or 1½ oz of hard liquor.  · Do not drink on an empty stomach.  · Keep yourself hydrated with water, diet soda, or unsweetened iced tea.  · Keep in mind that regular soda, juice, and other mixers may contain a lot of sugar and must be counted as carbs.  What are tips for following this plan?    Reading food labels  · Start by checking the serving size on the "Nutrition Facts" label of packaged foods and drinks. The amount of calories, carbs, fats, and other nutrients listed on the label is based on one serving of the item. Many items contain more than one serving per package.  · Check the total grams (g) of carbs in one serving. You can calculate the number of servings of carbs in one serving by dividing the total carbs by 15. For example, if a food has 30 g of total carbs, it would be equal to 2   servings of carbs.  · Check the number of grams (g) of saturated and trans fats in one serving. Choose foods that have low or no amount of these fats.  · Check the number of milligrams (mg) of salt (sodium) in one serving. Most people should limit total sodium intake to less than 2,300 mg per day.  · Always check the nutrition information of foods labeled as "low-fat" or "nonfat". These foods may be higher in added sugar or refined carbs and should be avoided.  · Talk to your dietitian to identify your daily goals for nutrients listed on the label.  Shopping  · Avoid buying canned, premade, or processed foods. These foods tend to be high in fat, sodium,  and added sugar.  · Shop around the outside edge of the grocery store. This includes fresh fruits and vegetables, bulk grains, fresh meats, and fresh dairy.  Cooking  · Use low-heat cooking methods, such as baking, instead of high-heat cooking methods like deep frying.  · Cook using healthy oils, such as olive, canola, or sunflower oil.  · Avoid cooking with butter, cream, or high-fat meats.  Meal planning  · Eat meals and snacks regularly, preferably at the same times every day. Avoid going long periods of time without eating.  · Eat foods high in fiber, such as fresh fruits, vegetables, beans, and whole grains. Talk to your dietitian about how many servings of carbs you can eat at each meal.  · Eat 4-6 ounces (oz) of lean protein each day, such as lean meat, chicken, fish, eggs, or tofu. One oz of lean protein is equal to:  ? 1 oz of meat, chicken, or fish.  ? 1 egg.  ? ¼ cup of tofu.  · Eat some foods each day that contain healthy fats, such as avocado, nuts, seeds, and fish.  Lifestyle  · Check your blood glucose regularly.  · Exercise regularly as told by your health care provider. This may include:  ? 150 minutes of moderate-intensity or vigorous-intensity exercise each week. This could be brisk walking, biking, or water aerobics.  ? Stretching and doing strength exercises, such as yoga or weightlifting, at least 2 times a week.  · Take medicines as told by your health care provider.  · Do not use any products that contain nicotine or tobacco, such as cigarettes and e-cigarettes. If you need help quitting, ask your health care provider.  · Work with a counselor or diabetes educator to identify strategies to manage stress and any emotional and social challenges.  Questions to ask a health care provider  · Do I need to meet with a diabetes educator?  · Do I need to meet with a dietitian?  · What number can I call if I have questions?  · When are the best times to check my blood glucose?  Where to find more  information:  · American Diabetes Association: diabetes.org  · Academy of Nutrition and Dietetics: www.eatright.org  · National Institute of Diabetes and Digestive and Kidney Diseases (NIH): www.niddk.nih.gov  Summary  · A healthy meal plan will help you control your blood glucose and maintain a healthy lifestyle.  · Working with a diet and nutrition specialist (dietitian) can help you make a meal plan that is best for you.  · Keep in mind that carbohydrates (carbs) and alcohol have immediate effects on your blood glucose levels. It is important to count carbs and to use alcohol carefully.  This information is not intended to   replace advice given to you by your health care provider. Make sure you discuss any questions you have with your health care provider.  Document Released: 09/24/2004 Document Revised: 07/28/2016 Document Reviewed: 02/02/2016  Elsevier Interactive Patient Education © 2019 [employer] Inc.

## 2018-04-25 LAB — CMP14+EGFR
ALT: 22 IU/L (ref 0–32)
AST: 23 IU/L (ref 0–40)
Albumin/Globulin Ratio: 1.9 (ref 1.2–2.2)
Albumin: 4.3 g/dL (ref 3.7–4.7)
Alkaline Phosphatase: 54 IU/L (ref 39–117)
BUN/Creatinine Ratio: 7 — ABNORMAL LOW (ref 12–28)
BUN: 6 mg/dL — ABNORMAL LOW (ref 8–27)
Bilirubin Total: 0.3 mg/dL (ref 0.0–1.2)
CO2: 25 mmol/L (ref 20–29)
Calcium: 9.3 mg/dL (ref 8.7–10.3)
Chloride: 101 mmol/L (ref 96–106)
Creatinine, Ser: 0.9 mg/dL (ref 0.57–1.00)
GFR calc Af Amer: 70 mL/min/{1.73_m2} (ref >59)
GFR calc non Af Amer: 61 mL/min/{1.73_m2} (ref >59)
Globulin, Total: 2.3 g/dL (ref 1.5–4.5)
Glucose: 112 mg/dL — ABNORMAL HIGH (ref 65–99)
Potassium: 4.4 mmol/L (ref 3.5–5.2)
Sodium: 142 mmol/L (ref 134–144)
Total Protein: 6.6 g/dL (ref 6.0–8.5)

## 2018-04-25 LAB — LIPID PANEL
Chol/HDL Ratio: 2.4 ratio (ref 0.0–4.4)
Cholesterol, Total: 165 mg/dL (ref 100–199)
HDL: 70 mg/dL (ref >39)
LDL Calculated: 78 mg/dL (ref 0–99)
Triglycerides: 86 mg/dL (ref 0–149)
VLDL Cholesterol Cal: 17 mg/dL (ref 5–40)

## 2018-04-25 LAB — HEMOGLOBIN A1C
Est. average glucose Bld gHb Est-mCnc: 128 mg/dL
Hgb A1c MFr Bld: 6.1 % — ABNORMAL HIGH (ref 4.8–5.6)

## 2018-04-26 NOTE — Progress Notes (Signed)
Subjective:     Patient ID: Vickie Brown , female    DOB: [birth year] , 78 y.o.   MRN: [id number]   Chief Complaint  Patient presents with  . Diabetes  . Hypertension    HPI  Diabetes  She presents for her follow-up diabetic visit. She has type 2 diabetes mellitus. Her disease course has been stable. There are no hypoglycemic associated symptoms. Pertinent negatives for hypoglycemia include no headaches. Pertinent negatives for diabetes include no blurred vision and no chest pain. There are no hypoglycemic complications. Risk factors for coronary artery disease include diabetes mellitus, dyslipidemia, hypertension and post-menopausal. She is following a diabetic diet. She participates in exercise intermittently. Her breakfast blood glucose is taken between 8-9 am. Her breakfast blood glucose range is generally 90-110 mg/dl. An ACE inhibitor/angiotensin II receptor blocker is being taken. She does not see a podiatrist. Hypertension  This is a chronic problem. The current episode started more than 1 year ago. The problem has been gradually improving since onset. Pertinent negatives include no blurred vision, chest pain, headaches, palpitations or shortness of breath. The current treatment provides moderate improvement. Hypertensive end-organ damage includes kidney disease. Identifiable causes of hypertension include chronic renal disease.     Past Medical History:  Diagnosis Date  . Chronic headaches   . Diverticulitis   . Hyperlipidemia   . Knee pain   . Type 2 diabetes mellitus (HCC)      Family History  Problem Relation Age of Onset  . Cervical cancer Mother   . Cancer Mother   . Diabetes Father   . Diabetes Sister   . Diabetes Brother   . Breast cancer Neg Hx      Current Outpatient Medications:  .  aspirin 81 MG tablet, Take 81 mg by mouth every evening. , Disp: , Rfl:  .  Cholecalciferol (VITAMIN D3) 2000 UNITS TABS, Take 1 tablet by mouth daily.  , Disp: , Rfl:  .   clobetasol cream (TEMOVATE) 0.05 %, apply A THIN LAYER TO AFFECTED AREAS 2 TIMES DAILY, Disp: , Rfl: 0 .  esomeprazole (NEXIUM) 40 MG capsule, Take 40 mg by mouth daily before breakfast.  , Disp: , Rfl:  .  fluticasone (FLONASE) 50 MCG/ACT nasal spray, Place 1 spray into both nostrils at bedtime., Disp: 16 g, Rfl: 2 .  JANUVIA 100 MG tablet, TAKE 1 TABLET BY MOUTH ONCE DAILY, Disp: 90 tablet, Rfl: 0 .  losartan (COZAAR) 50 MG tablet, TAKE 1 TABLET BY MOUTH EVERY DAY, Disp: 90 tablet, Rfl: 1 .  Multiple Vitamins-Minerals (ALIVE ONCE DAILY WOMENS 50+ PO), Take 1 tablet by mouth daily., Disp: , Rfl:  .  Omega-3 Fatty Acids (FISH OIL PO), Take by mouth., Disp: , Rfl:  .  pravastatin (PRAVACHOL) 40 MG tablet, TAKE 1 TABLET BY MOUTH ONCE DAILY, Disp: 90 tablet, Rfl: 0 .  Probiotic Product (ALIGN) 4 MG CAPS, Take 4 mg by mouth daily., Disp: , Rfl:  .  TURMERIC PO, Take by mouth., Disp: , Rfl:  .  diclofenac sodium (VOLTAREN) 1 % GEL, Apply 2 g topically 4 (four) times daily. Apply to knees prn, Disp: 100 g, Rfl: 1   No Known Allergies   Review of Systems  Eyes: Negative for blurred vision.  Respiratory: Negative for shortness of breath.   Cardiovascular: Negative for chest pain and palpitations.  Musculoskeletal: Positive for arthralgias (she c/o r knee pain. there is some pain with ambulation. has been seen by Ortho in the  past. has h/o OA. needs something for pain).  Neurological: Negative for headaches.     Today's Vitals   04/24/18 1158  BP: 112/68  Pulse: 84  Temp: 98.7 F (37.1 C)  TempSrc: Oral  Weight: 151 lb 6.4 oz (68.7 kg)  Height: 5' 0.2" (1.529 m)  PainSc: 0-No pain   Body mass index is 29.37 kg/m.   Objective:  Physical Exam Vitals signs and nursing note reviewed.  Constitutional:      Appearance: Normal appearance.  HENT:     Head: Normocephalic and atraumatic.  Cardiovascular:     Rate and Rhythm: Normal rate and regular rhythm.     Heart sounds: Normal heart  sounds.  Pulmonary:     Effort: Pulmonary effort is normal.     Breath sounds: Normal breath sounds.  Musculoskeletal:        General: Swelling present.     Comments: r knee swelling, nontender to palpation, crepitus  Skin:    General: Skin is warm.  Neurological:     General: No focal deficit present.     Mental Status: She is alert.  Psychiatric:        Mood and Affect: Mood normal.        Behavior: Behavior normal.         Assessment And Plan:     1. Type 2 diabetes mellitus with stage 2 chronic kidney disease, without long-term current use of insulin ([place])  I will check labs as listed below. Importance of regular exercise was discussed with the patient. She will rto in four months for re-evaluation. She is encouraged to avoid sugary beverages and sweetened foods.   - Lipid panel - CMP14+EGFR - Hemoglobin A1c  2. Hypertensive nephropathy  Well controlled. She will continue with current meds. She is encouraged to avoid adding salt to her foods.  3. Pure hypercholesterolemia  I will check a fasting lipid panel and LFTs today. Importance of medication and dietary compliance was discussed with the patient.  4. Recurrent pain of right knee  She was given rx voltaren gel !% to apply to affected knee tid prn. She will let me know if her sx persist. If so, I will refer her to Ortho for radiographic studies and further evaluation.   Maximino Greenland, MD    THE PATIENT IS ENCOURAGED TO PRACTICE SOCIAL DISTANCING DUE TO THE COVID-19 PANDEMIC.

## 2018-05-05 ENCOUNTER — Other Ambulatory Visit: Payer: Self-pay | Admitting: Internal Medicine

## 2018-05-17 ENCOUNTER — Other Ambulatory Visit: Payer: Self-pay | Admitting: Internal Medicine

## 2018-06-16 LAB — HM DIABETES EYE EXAM

## 2018-06-29 ENCOUNTER — Encounter: Payer: Self-pay | Admitting: Internal Medicine

## 2018-06-30 ENCOUNTER — Other Ambulatory Visit: Payer: Self-pay | Admitting: Internal Medicine

## 2018-07-07 ENCOUNTER — Other Ambulatory Visit: Payer: Self-pay | Admitting: Internal Medicine

## 2018-07-21 ENCOUNTER — Other Ambulatory Visit: Payer: Self-pay | Admitting: Internal Medicine

## 2018-07-26 ENCOUNTER — Ambulatory Visit: Payer: Medicare Other | Admitting: Nurse Practitioner

## 2018-07-27 ENCOUNTER — Other Ambulatory Visit: Payer: Self-pay | Admitting: Gastroenterology

## 2018-07-27 DIAGNOSIS — G8929 Other chronic pain: Secondary | ICD-10-CM

## 2018-07-27 DIAGNOSIS — R1032 Left lower quadrant pain: Secondary | ICD-10-CM

## 2018-07-28 ENCOUNTER — Ambulatory Visit
Admission: RE | Admit: 2018-07-28 | Discharge: 2018-07-28 | Disposition: A | Discharge status: Home / Self Care | Payer: Medicare Other | Source: Ambulatory Visit | Attending: Gastroenterology | Admitting: Gastroenterology

## 2018-07-28 DIAGNOSIS — G8929 Other chronic pain: Secondary | ICD-10-CM

## 2018-07-28 DIAGNOSIS — R1032 Left lower quadrant pain: Secondary | ICD-10-CM

## 2018-07-28 MED ORDER — IOPAMIDOL (ISOVUE-300) INJECTION 61%
100.0000 mL | Freq: Once | INTRAVENOUS | Status: AC | PRN
  Administered 2018-07-28: 09:00:00 100 mL via INTRAVENOUS

## 2018-08-13 ENCOUNTER — Other Ambulatory Visit: Payer: Self-pay | Admitting: Internal Medicine

## 2018-09-28 ENCOUNTER — Ambulatory Visit: Payer: Medicare Other

## 2018-09-28 ENCOUNTER — Other Ambulatory Visit: Payer: Self-pay

## 2018-09-28 ENCOUNTER — Ambulatory Visit (INDEPENDENT_AMBULATORY_CARE_PROVIDER_SITE_OTHER): Payer: Medicare Other

## 2018-09-28 VITALS — BP 122/72 | HR 82 | Temp 98.3°F | Ht 60.0 in | Wt 145.8 lb

## 2018-09-28 DIAGNOSIS — Z0000 Encounter for general adult medical examination without abnormal findings: Secondary | ICD-10-CM

## 2018-09-28 DIAGNOSIS — E2839 Other primary ovarian failure: Secondary | ICD-10-CM | POA: Diagnosis not present

## 2018-09-28 NOTE — Progress Notes (Signed)
Subjective:   Vickie Brown is a 78 y.o. female who presents for Medicare Annual (Subsequent) preventive examination.  Review of Systems:  n/a Cardiac Risk Factors include: advanced age (>7855men, 82>65 women);diabetes mellitus;hypertension;sedentary lifestyle     Objective:     Vitals: BP 122/72 (BP Location: Left Arm, Patient Position: Sitting, Cuff Size: Normal)   Pulse 82   Temp 98.3 F (36.8 C) (Oral)   Ht 5' (1.524 m)   Wt 145 lb 12.8 oz (66.1 kg)   SpO2 97%   BMI 28.47 kg/m   Body mass index is 28.47 kg/m.  Advanced Directives 09/28/2018 07/25/2016  Does Patient Have a Medical Advance Directive? No No  Would patient like information on creating a medical advance directive? - No - Patient declined    Tobacco Social History   Tobacco Use  Smoking Status Never Smoker  Smokeless Tobacco Never Used     Counseling given: Not Answered   Clinical Intake:  Pre-visit preparation completed: Yes  Pain : 0-10 Pain Score: 7  Pain Type: Chronic pain Pain Location: Generalized Pain Descriptors / Indicators: Aching, Sharp, Sore Pain Onset: More than a month ago Pain Frequency: Constant Pain Relieving Factors: nothing  Pain Relieving Factors: nothing  Nutritional Status: BMI 25 -29 Overweight Nutritional Risks: None Diabetes: Yes CBG done?: No Did pt. bring in CBG monitor from home?: No  How often do you need to have someone help you when you read instructions, pamphlets, or other written materials from your doctor or pharmacy?: 1 - Never What is the last grade level you completed in school?: GED  Interpreter Needed?: No  Information entered by :: NAllen LPN  Past Medical History:  Diagnosis Date  . Chronic headaches   . Diverticulitis   . Hyperlipidemia   . Knee pain   . Type 2 diabetes mellitus (HCC)    Past Surgical History:  Procedure Laterality Date  . Bilateral tubal ligation  1971  . BTL    . PARTIAL HYSTERECTOMY  1973  . TONSILECTOMY,  ADENOIDECTOMY, BILATERAL MYRINGOTOMY AND TUBES  1950  . VESICOVAGINAL FISTULA CLOSURE W/ TAH     Family History  Problem Relation Age of Onset  . Cervical cancer Mother   . Cancer Mother   . Diabetes Father   . Diabetes Sister   . Diabetes Brother   . Breast cancer Neg Hx    Social History   Socioeconomic History  . Marital status: Divorced    Spouse name: Not on file  . Number of children: Not on file  . Years of education: Not on file  . Highest education level: Not on file  Occupational History  . Occupation: retired  Engineer, productionocial Needs  . Financial resource strain: Very hard  . Food insecurity    Worry: Never true    Inability: Never true  . Transportation needs    Medical: No    Non-medical: No  Tobacco Use  . Smoking status: Never Smoker  . Smokeless tobacco: Never Used  Substance and Sexual Activity  . Alcohol use: Not Currently  . Drug use: No  . Sexual activity: Not Currently    Birth control/protection: Surgical  Lifestyle  . Physical activity    Days per week: 0 days    Minutes per session: 0 min  . Stress: Not at all  Relationships  . Social Musicianconnections    Talks on phone: Not on file    Gets together: Not on file    Attends religious  service: Not on file    Active member of club or organization: Not on file    Attends meetings of clubs or organizations: Not on file    Relationship status: Not on file  Other Topics Concern  . Not on file  Social History Narrative   Lives alone.    Outpatient Encounter Medications as of 09/28/2018  Medication Sig  . aspirin 81 MG tablet Take 81 mg by mouth every evening.   . Cholecalciferol (VITAMIN D3) 2000 UNITS TABS Take 1 tablet by mouth daily.    . clobetasol cream (TEMOVATE) 0.05 % apply A THIN LAYER TO AFFECTED AREAS 2 TIMES DAILY  . esomeprazole (NEXIUM) 40 MG capsule Take 40 mg by mouth daily before breakfast.    . fluticasone (FLONASE) 50 MCG/ACT nasal spray Place 1 spray into both nostrils at bedtime.  Marland Kitchen  JANUVIA 100 MG tablet TAKE 1 TABLET BY MOUTH EVERY DAY  . losartan (COZAAR) 50 MG tablet TAKE 1 TABLET BY MOUTH EVERY DAY  . Multiple Vitamins-Minerals (ALIVE ONCE DAILY WOMENS 50+ PO) Take 1 tablet by mouth daily.  . Omega-3 Fatty Acids (FISH OIL PO) Take by mouth.  . pravastatin (PRAVACHOL) 40 MG tablet TAKE 1 TABLET BY MOUTH EVERY DAY  . Probiotic Product (ALIGN) 4 MG CAPS Take 4 mg by mouth daily.  . TURMERIC PO Take by mouth.  . diclofenac sodium (VOLTAREN) 1 % GEL APPLY 2 GRAMS TOPICALLY TO KNEES FOUR TIMES DAILY AS NEEDED (Patient not taking: Reported on 09/28/2018)   No facility-administered encounter medications on file as of 09/28/2018.     Activities of Daily Living In your present state of health, do you have any difficulty performing the following activities: 09/28/2018  Hearing? N  Vision? Y  Comment sometimes  Difficulty concentrating or making decisions? Y  Walking or climbing stairs? Y  Comment due to knees  Dressing or bathing? N  Doing errands, shopping? N  Preparing Food and eating ? N  Using the Toilet? N  In the past six months, have you accidently leaked urine? N  Do you have problems with loss of bowel control? N  Managing your Medications? N  Managing your Finances? N  Housekeeping or managing your Housekeeping? N  Some recent data might be hidden    Patient Care Team: Glendale Chard, MD as PCP - General (Internal Medicine)    Assessment:   This is a routine wellness examination for Vickie Brown.  Exercise Activities and Dietary recommendations Current Exercise Habits: The patient does not participate in regular exercise at present  Goals    . Patient Stated     09/28/2018, no goals       Fall Risk Fall Risk  09/28/2018 04/24/2018 01/26/2018 12/22/2017 12/30/2014  Falls in the past year? 0 1 0 0 No  Number falls in past yr: - 0 - - -  Injury with Fall? - 0 - - -  Risk for fall due to : Medication side effect - - - -  Follow up Falls prevention  discussed;Education provided;Falls evaluation completed - - - -   Is the patient's home free of loose throw rugs in walkways, pet beds, electrical cords, etc?   yes      Grab bars in the bathroom? yes      Handrails on the stairs?   yes      Adequate lighting?   yes  Timed Get Up and Go performed: n/a  Depression Screen PHQ 2/9 Scores 09/28/2018 04/24/2018 01/26/2018  12/22/2017  PHQ - 2 Score 1 0 0 0  PHQ- 9 Score 7 - - -     Cognitive Function     6CIT Screen 09/28/2018  What Year? 0 points  What month? 0 points  What time? 0 points  Count back from 20 0 points  Months in reverse 0 points  Repeat phrase 0 points  Total Score 0    Immunization History  Administered Date(s) Administered  . DTaP 10/28/2015  . Influenza Whole 10/11/2009  . Pneumococcal-Unspecified 03/28/2013, 10/28/2015    Qualifies for Shingles Vaccine? yes  Screening Tests Health Maintenance  Topic Date Due  . FOOT EXAM  02/10/1949  . TETANUS/TDAP  02/10/1958  . DEXA SCAN  02/11/2004  . PNA vac Low Risk Adult (2 of 2 - PCV13) 10/27/2016  . INFLUENZA VACCINE  08/12/2018  . HEMOGLOBIN A1C  10/24/2018  . OPHTHALMOLOGY EXAM  06/16/2019    Cancer Screenings: Lung: Low Dose CT Chest recommended if Age 44-80 years, 30 pack-year currently smoking OR have quit w/in 15years. Patient does not qualify. Breast:  Up to date on Mammogram? Yes   Up to date of Bone Density/Dexa? Yes Colorectal: up to date  Additional Screenings: : Hepatitis C Screening: n/a     Plan:    Patient has no goals set at this time.   I have personally reviewed and noted the following in the patient's chart:   . Medical and social history . Use of alcohol, tobacco or illicit drugs  . Current medications and supplements . Functional ability and status . Nutritional status . Physical activity . Advanced directives . List of other physicians . Hospitalizations, surgeries, and ER visits in previous 12 months . Vitals .  Screenings to include cognitive, depression, and falls . Referrals and appointments  In addition, I have reviewed and discussed with patient certain preventive protocols, quality metrics, and best practice recommendations. A written personalized care plan for preventive services as well as general preventive health recommendations were provided to patient.     Barb Merino, LPN  07/17/2374

## 2018-09-28 NOTE — Patient Instructions (Signed)
Vickie Brown , Thank you for taking time to come for your Medicare Wellness Visit. I appreciate your ongoing commitment to your health goals. Please review the following plan we discussed and let me know if I can assist you in the future.   Screening recommendations/referrals: Colonoscopy: 10/2017 Mammogram: 09/2017 Bone Density: referred Recommended yearly ophthalmology/optometry visit for glaucoma screening and checkup Recommended yearly dental visit for hygiene and checkup  Vaccinations: Influenza vaccine: declined Pneumococcal vaccine: 10/2015 Tdap vaccine: 10/2015 Shingles vaccine: discussed    Advanced directives: Advance directive discussed with you today. Even though you declined this today please call our office should you change your mind and we can give you the proper paperwork for you to fill out.   Conditions/risks identified: overweight  Next appointment: 10/12/2018 at 3:30   Preventive Care 40 Years and Older, Female Preventive care refers to lifestyle choices and visits with your health care provider that can promote health and wellness. What does preventive care include?  A yearly physical exam. This is also called an annual well check.  Dental exams once or twice a year.  Routine eye exams. Ask your health care provider how often you should have your eyes checked.  Personal lifestyle choices, including:  Daily care of your teeth and gums.  Regular physical activity.  Eating a healthy diet.  Avoiding tobacco and drug use.  Limiting alcohol use.  Practicing safe sex.  Taking low-dose aspirin every day.  Taking vitamin and mineral supplements as recommended by your health care provider. What happens during an annual well check? The services and screenings done by your health care provider during your annual well check will depend on your age, overall health, lifestyle risk factors, and family history of disease. Counseling  Your health care provider may  ask you questions about your:  Alcohol use.  Tobacco use.  Drug use.  Emotional well-being.  Home and relationship well-being.  Sexual activity.  Eating habits.  History of falls.  Memory and ability to understand (cognition).  Work and work Statistician.  Reproductive health. Screening  You may have the following tests or measurements:  Height, weight, and BMI.  Blood pressure.  Lipid and cholesterol levels. These may be checked every 5 years, or more frequently if you are over 44 years old.  Skin check.  Lung cancer screening. You may have this screening every year starting at age 83 if you have a 30-pack-year history of smoking and currently smoke or have quit within the past 15 years.  Fecal occult blood test (FOBT) of the stool. You may have this test every year starting at age 60.  Flexible sigmoidoscopy or colonoscopy. You may have a sigmoidoscopy every 5 years or a colonoscopy every 10 years starting at age 11.  Hepatitis C blood test.  Hepatitis B blood test.  Sexually transmitted disease (STD) testing.  Diabetes screening. This is done by checking your blood sugar (glucose) after you have not eaten for a while (fasting). You may have this done every 1-3 years.  Bone density scan. This is done to screen for osteoporosis. You may have this done starting at age 22.  Mammogram. This may be done every 1-2 years. Talk to your health care provider about how often you should have regular mammograms. Talk with your health care provider about your test results, treatment options, and if necessary, the need for more tests. Vaccines  Your health care provider may recommend certain vaccines, such as:  Influenza vaccine. This is recommended every  year.  Tetanus, diphtheria, and acellular pertussis (Tdap, Td) vaccine. You may need a Td booster every 10 years.  Zoster vaccine. You may need this after age 68.  Pneumococcal 13-valent conjugate (PCV13) vaccine. One  dose is recommended after age 5.  Pneumococcal polysaccharide (PPSV23) vaccine. One dose is recommended after age 48. Talk to your health care provider about which screenings and vaccines you need and how often you need them. This information is not intended to replace advice given to you by your health care provider. Make sure you discuss any questions you have with your health care provider. Document Released: 01/24/2015 Document Revised: 09/17/2015 Document Reviewed: 10/29/2014 Elsevier Interactive Patient Education  2017 [place] Prevention in the Home Falls can cause injuries. They can happen to people of all ages. There are many things you can do to make your home safe and to help prevent falls. What can I do on the outside of my home?  Regularly fix the edges of walkways and driveways and fix any cracks.  Remove anything that might make you trip as you walk through a door, such as a raised step or threshold.  Trim any bushes or trees on the path to your home.  Use bright outdoor lighting.  Clear any walking paths of anything that might make someone trip, such as rocks or tools.  Regularly check to see if handrails are loose or broken. Make sure that both sides of any steps have handrails.  Any raised decks and porches should have guardrails on the edges.  Have any leaves, snow, or ice cleared regularly.  Use sand or salt on walking paths during winter.  Clean up any spills in your garage right away. This includes oil or grease spills. What can I do in the bathroom?  Use night lights.  Install grab bars by the toilet and in the tub and shower. Do not use towel bars as grab bars.  Use non-skid mats or decals in the tub or shower.  If you need to sit down in the shower, use a plastic, non-slip stool.  Keep the floor dry. Clean up any water that spills on the floor as soon as it happens.  Remove soap buildup in the tub or shower regularly.  Attach bath  mats securely with double-sided non-slip rug tape.  Do not have throw rugs and other things on the floor that can make you trip. What can I do in the bedroom?  Use night lights.  Make sure that you have a light by your bed that is easy to reach.  Do not use any sheets or blankets that are too big for your bed. They should not hang down onto the floor.  Have a firm chair that has side arms. You can use this for support while you get dressed.  Do not have throw rugs and other things on the floor that can make you trip. What can I do in the kitchen?  Clean up any spills right away.  Avoid walking on wet floors.  Keep items that you use a lot in easy-to-reach places.  If you need to reach something above you, use a strong step stool that has a grab bar.  Keep electrical cords out of the way.  Do not use floor polish or wax that makes floors slippery. If you must use wax, use non-skid floor wax.  Do not have throw rugs and other things on the floor that can make you trip. What can  I do with my stairs?  Do not leave any items on the stairs.  Make sure that there are handrails on both sides of the stairs and use them. Fix handrails that are broken or loose. Make sure that handrails are as long as the stairways.  Check any carpeting to make sure that it is firmly attached to the stairs. Fix any carpet that is loose or worn.  Avoid having throw rugs at the top or bottom of the stairs. If you do have throw rugs, attach them to the floor with carpet tape.  Make sure that you have a light switch at the top of the stairs and the bottom of the stairs. If you do not have them, ask someone to add them for you. What else can I do to help prevent falls?  Wear shoes that:  Do not have high heels.  Have rubber bottoms.  Are comfortable and fit you well.  Are closed at the toe. Do not wear sandals.  If you use a stepladder:  Make sure that it is fully opened. Do not climb a closed  stepladder.  Make sure that both sides of the stepladder are locked into place.  Ask someone to hold it for you, if possible.  Clearly mark and make sure that you can see:  Any grab bars or handrails.  First and last steps.  Where the edge of each step is.  Use tools that help you move around (mobility aids) if they are needed. These include:  Canes.  Walkers.  Scooters.  Crutches.  Turn on the lights when you go into a dark area. Replace any light bulbs as soon as they burn out.  Set up your furniture so you have a clear path. Avoid moving your furniture around.  If any of your floors are uneven, fix them.  If there are any pets around you, be aware of where they are.  Review your medicines with your doctor. Some medicines can make you feel dizzy. This can increase your chance of falling. Ask your doctor what other things that you can do to help prevent falls. This information is not intended to replace advice given to you by your health care provider. Make sure you discuss any questions you have with your health care provider. Document Released: 10/24/2008 Document Revised: 06/05/2015 Document Reviewed: 02/01/2014 Elsevier Interactive Patient Education  2017 [employer].

## 2018-10-12 ENCOUNTER — Other Ambulatory Visit: Payer: Self-pay

## 2018-10-12 ENCOUNTER — Encounter: Payer: Self-pay | Admitting: Internal Medicine

## 2018-10-12 ENCOUNTER — Ambulatory Visit (INDEPENDENT_AMBULATORY_CARE_PROVIDER_SITE_OTHER): Payer: Medicare Other | Admitting: Internal Medicine

## 2018-10-12 VITALS — BP 118/60 | HR 60 | Temp 98.7°F | Ht 60.2 in | Wt 148.2 lb

## 2018-10-12 DIAGNOSIS — R413 Other amnesia: Secondary | ICD-10-CM

## 2018-10-12 DIAGNOSIS — N182 Chronic kidney disease, stage 2 (mild): Secondary | ICD-10-CM | POA: Diagnosis not present

## 2018-10-12 DIAGNOSIS — Z6828 Body mass index (BMI) 28.0-28.9, adult: Secondary | ICD-10-CM

## 2018-10-12 DIAGNOSIS — M542 Cervicalgia: Secondary | ICD-10-CM

## 2018-10-12 DIAGNOSIS — E1122 Type 2 diabetes mellitus with diabetic chronic kidney disease: Secondary | ICD-10-CM

## 2018-10-12 DIAGNOSIS — E663 Overweight: Secondary | ICD-10-CM

## 2018-10-12 DIAGNOSIS — I129 Hypertensive chronic kidney disease with stage 1 through stage 4 chronic kidney disease, or unspecified chronic kidney disease: Secondary | ICD-10-CM

## 2018-10-12 NOTE — Patient Instructions (Signed)

## 2018-10-13 LAB — CMP14+EGFR
ALT: 24 IU/L (ref 0–32)
AST: 30 IU/L (ref 0–40)
Albumin/Globulin Ratio: 1.8 (ref 1.2–2.2)
Albumin: 4.4 g/dL (ref 3.7–4.7)
Alkaline Phosphatase: 66 IU/L (ref 39–117)
BUN/Creatinine Ratio: 7 — ABNORMAL LOW (ref 12–28)
BUN: 7 mg/dL — ABNORMAL LOW (ref 8–27)
Bilirubin Total: 0.2 mg/dL (ref 0.0–1.2)
CO2: 25 mmol/L (ref 20–29)
Calcium: 9.5 mg/dL (ref 8.7–10.3)
Chloride: 101 mmol/L (ref 96–106)
Creatinine, Ser: 0.94 mg/dL (ref 0.57–1.00)
GFR calc Af Amer: 67 mL/min/{1.73_m2} (ref >59)
GFR calc non Af Amer: 58 mL/min/{1.73_m2} — ABNORMAL LOW (ref >59)
Globulin, Total: 2.4 g/dL (ref 1.5–4.5)
Glucose: 100 mg/dL — ABNORMAL HIGH (ref 65–99)
Potassium: 4.5 mmol/L (ref 3.5–5.2)
Sodium: 142 mmol/L (ref 134–144)
Total Protein: 6.8 g/dL (ref 6.0–8.5)

## 2018-10-13 LAB — HEMOGLOBIN A1C
Est. average glucose Bld gHb Est-mCnc: 131 mg/dL
Hgb A1c MFr Bld: 6.2 % — ABNORMAL HIGH (ref 4.8–5.6)

## 2018-10-13 LAB — VITAMIN B12: Vitamin B-12: >2000 pg/mL — ABNORMAL HIGH (ref 232–1245)

## 2018-10-13 LAB — TSH: TSH: 1.81 u[IU]/mL (ref 0.450–4.500)

## 2018-10-13 LAB — RPR: RPR Ser Ql: NONREACTIVE

## 2018-10-15 NOTE — Progress Notes (Signed)
Subjective:     Patient ID: Vickie Brown , female    DOB: 12-May-1940 , 78 y.o.   MRN: [id number]   Chief Complaint  Patient presents with  . Diabetes  . Hypertension    HPI  Diabetes She presents for her follow-up diabetic visit. She has type 2 diabetes mellitus. Her disease course has been stable. There are no hypoglycemic associated symptoms. Pertinent negatives for hypoglycemia include no headaches. Pertinent negatives for diabetes include no blurred vision and no chest pain. There are no hypoglycemic complications. Risk factors for coronary artery disease include diabetes mellitus, dyslipidemia, hypertension and post-menopausal. She is following a diabetic diet. She participates in exercise intermittently. Her breakfast blood glucose is taken between 8-9 am. Her breakfast blood glucose range is generally 90-110 mg/dl. An ACE inhibitor/angiotensin II receptor blocker is being taken. She does not see a podiatrist. Hypertension This is a chronic problem. The current episode started more than 1 year ago. The problem has been gradually improving since onset. Associated symptoms include neck pain. Pertinent negatives include no blurred vision, chest pain, headaches, palpitations or shortness of breath. The current treatment provides moderate improvement. Hypertensive end-organ damage includes kidney disease. Identifiable causes of hypertension include chronic renal disease.     Past Medical History:  Diagnosis Date  . Chronic headaches   . Diverticulitis   . Hyperlipidemia   . Knee pain   . Type 2 diabetes mellitus (HCC)      Family History  Problem Relation Age of Onset  . Cervical cancer Mother   . Cancer Mother   . Diabetes Father   . Diabetes Sister   . Diabetes Brother   . Breast cancer Neg Hx      Current Outpatient Medications:  .  aspirin 81 MG tablet, Take 81 mg by mouth every evening. , Disp: , Rfl:  .  Cholecalciferol (VITAMIN D3) 2000 UNITS TABS, Take 1 tablet by  mouth daily.  , Disp: , Rfl:  .  clobetasol cream (TEMOVATE) 0.05 %, apply A THIN LAYER TO AFFECTED AREAS 2 TIMES DAILY, Disp: , Rfl: 0 .  diclofenac sodium (VOLTAREN) 1 % GEL, APPLY 2 GRAMS TOPICALLY TO KNEES FOUR TIMES DAILY AS NEEDED (Patient not taking: Reported on 09/28/2018), Disp: 100 g, Rfl: 1 .  esomeprazole (NEXIUM) 40 MG capsule, Take 40 mg by mouth daily before breakfast.  , Disp: , Rfl:  .  fluticasone (FLONASE) 50 MCG/ACT nasal spray, Place 1 spray into both nostrils at bedtime., Disp: 16 g, Rfl: 2 .  JANUVIA 100 MG tablet, TAKE 1 TABLET BY MOUTH EVERY DAY, Disp: 90 tablet, Rfl: 0 .  losartan (COZAAR) 50 MG tablet, TAKE 1 TABLET BY MOUTH EVERY DAY, Disp: 90 tablet, Rfl: 1 .  Multiple Vitamins-Minerals (ALIVE ONCE DAILY WOMENS 50+ PO), Take 1 tablet by mouth daily., Disp: , Rfl:  .  Omega-3 Fatty Acids (FISH OIL PO), Take by mouth., Disp: , Rfl:  .  pravastatin (PRAVACHOL) 40 MG tablet, TAKE 1 TABLET BY MOUTH EVERY DAY, Disp: 90 tablet, Rfl: 1 .  Probiotic Product (ALIGN) 4 MG CAPS, Take 4 mg by mouth daily., Disp: , Rfl:  .  TURMERIC PO, Take by mouth., Disp: , Rfl:    No Known Allergies   Review of Systems  Constitutional: Negative.   Eyes: Negative for blurred vision.  Respiratory: Negative.  Negative for shortness of breath.   Cardiovascular: Negative.  Negative for chest pain and palpitations.  Gastrointestinal: Negative.   Musculoskeletal: Positive  for neck pain.  Neurological: Negative.  Negative for headaches.       She c/o having memory issues. She often forgets names. She sometimes walks into a room and forgets why. Denies change in eating/sleeping habits.   Psychiatric/Behavioral: Negative.      Today's Vitals   10/12/18 1538  BP: 118/60  Pulse: 60  Temp: 98.7 F (37.1 C)  TempSrc: Oral  SpO2: 97%  Weight: 148 lb 3.2 oz (67.2 kg)  Height: 5' 0.2" (1.529 m)   Body mass index is 28.75 kg/m.   Objective:  Physical Exam Vitals signs and nursing note  reviewed.  Constitutional:      Appearance: Normal appearance.  HENT:     Head: Normocephalic and atraumatic.  Neck:     Musculoskeletal: Normal range of motion. Muscular tenderness present.  Cardiovascular:     Rate and Rhythm: Normal rate and regular rhythm.     Pulses:          Dorsalis pedis pulses are 2+ on the right side and 2+ on the left side.     Heart sounds: Normal heart sounds.  Pulmonary:     Effort: Pulmonary effort is normal.     Breath sounds: Normal breath sounds.  Feet:     Right foot:     Protective Sensation: 5 sites tested. 5 sites sensed.     Skin integrity: Skin integrity normal.     Toenail Condition: Right toenails are long.     Left foot:     Protective Sensation: 5 sites tested. 5 sites sensed.     Skin integrity: Skin integrity normal.     Toenail Condition: Left toenails are long.  Skin:    General: Skin is warm.  Neurological:     General: No focal deficit present.     Mental Status: She is alert.  Psychiatric:        Mood and Affect: Mood normal.        Behavior: Behavior normal.         Assessment And Plan:     1. Type 2 diabetes mellitus with stage 2 chronic kidney disease, without long-term current use of insulin ([place])  Diabetic foot exam was performed. I will refer her to Podiatry for further evaluation as requested.  I DISCUSSED WITH THE PATIENT AT LENGTH REGARDING THE GOALS OF GLYCEMIC CONTROL AND POSSIBLE LONG-TERM COMPLICATIONS.  I  ALSO STRESSED THE IMPORTANCE OF COMPLIANCE WITH HOME GLUCOSE MONITORING, DIETARY RESTRICTIONS INCLUDING AVOIDANCE OF SUGARY DRINKS/PROCESSED FOODS,  ALONG WITH REGULAR EXERCISE.  I  ALSO STRESSED THE IMPORTANCE OF ANNUAL EYE EXAMS, SELF FOOT CARE AND COMPLIANCE WITH OFFICE VISITS.  - CMP14+EGFR - Hemoglobin A1c - Ambulatory referral to Podiatry  2. Hypertensive nephropathy  Chronic, well controlled. She will continue with current meds. She is encouraged to avoid adding salt to his foods.   3.  Cervicalgia  She is advised to apply mentholated cream to upper shoulders/neck nightly. She is also advised to take magnesium nightly 250-483m nightly, this should help with muscle spasms.   4. Memory loss  I will check labs as listed below.   - TSH - Vitamin B12 - RPR  5. Overweight with body mass index (BMI) of 28 to 28.9 in adult  Importance of achieving optimal weight to decrease risk of cardiovascular disease and cancers was discussed with the patient in full detail. She is encouraged to start slowly - start with 10 minutes twice daily at least three to four days  per week and to gradually build to 30 minutes five days weekly. She was given tips to incorporate more activity into her daily routine - take stairs when possible, park farther away from her grocery stores, etc.    Maximino Greenland, MD    THE PATIENT IS ENCOURAGED TO PRACTICE SOCIAL DISTANCING DUE TO THE COVID-19 PANDEMIC.

## 2018-10-16 DIAGNOSIS — E119 Type 2 diabetes mellitus without complications: Secondary | ICD-10-CM | POA: Diagnosis not present

## 2018-10-23 ENCOUNTER — Ambulatory Visit: Payer: Medicare Other | Admitting: Internal Medicine

## 2018-10-23 ENCOUNTER — Encounter: Payer: Self-pay | Admitting: Internal Medicine

## 2018-10-26 ENCOUNTER — Telehealth: Payer: Self-pay

## 2018-10-26 NOTE — Telephone Encounter (Signed)
Please send her to [facility]. She can check with pharmacist to see if chlorpheniramine will interfere with her other meds. This is extremely sedating so she needs to take at night. This is an OTC product

## 2018-10-26 NOTE — Telephone Encounter (Addendum)
The pt said that she has a bad cold and has been taking Coricedan, Oscillococinum for 2 days and that the meds aren' t helping.  The pt wants to know what else she can take along with her meds  to help because she is very congested. The pt also wants to know if she can be tested for the coronavirus?

## 2018-10-30 ENCOUNTER — Telehealth: Payer: Self-pay

## 2018-10-30 NOTE — Telephone Encounter (Signed)
Pt sent to [facility] also informed her that She can check with pharmacist to see if chlorpheniramine will interfere with her other meds. This is extremely sedating so she needs to take at night. This is an OTC product

## 2018-10-31 ENCOUNTER — Telehealth: Payer: Self-pay

## 2018-10-31 NOTE — Telephone Encounter (Signed)
Left vm for pt to call back. Just checking on pt to see how she was feeling and if she went to [facility] for covid testing

## 2018-10-31 NOTE — Telephone Encounter (Signed)
Please check on patient. She called with cold last week. How is she feeling?

## 2018-11-02 ENCOUNTER — Telehealth: Payer: Self-pay

## 2018-11-02 ENCOUNTER — Other Ambulatory Visit: Payer: Self-pay

## 2018-11-02 DIAGNOSIS — Z20822 Contact with and (suspected) exposure to covid-19: Secondary | ICD-10-CM

## 2018-11-02 NOTE — Telephone Encounter (Signed)
I left the pt a message that Dr. Baird Cancer wanted me to check and see how the pt is feeling.

## 2018-11-05 LAB — NOVEL CORONAVIRUS, NAA: SARS-CoV-2, NAA: NOT DETECTED

## 2018-11-06 ENCOUNTER — Telehealth: Payer: Self-pay

## 2018-11-06 ENCOUNTER — Other Ambulatory Visit: Payer: Self-pay

## 2018-11-06 MED ORDER — PRAVASTATIN SODIUM 40 MG PO TABS: 40.0000 mg | ORAL_TABLET | Freq: Every day | ORAL | 1 refills | Status: DC

## 2018-11-06 NOTE — Telephone Encounter (Signed)
The pt called and left a message that she got the message that Dr. Baird Cancer wanted to know how she is doing.  The pt said that she is feeling better.

## 2018-11-13 ENCOUNTER — Other Ambulatory Visit: Payer: Self-pay

## 2018-11-13 MED ORDER — SITAGLIPTIN PHOSPHATE 100 MG PO TABS: 100.0000 mg | ORAL_TABLET | Freq: Every day | ORAL | 0 refills | Status: DC

## 2018-11-28 NOTE — Telephone Encounter (Signed)
Is she feeling any better?

## 2018-12-19 ENCOUNTER — Other Ambulatory Visit: Payer: Self-pay

## 2018-12-19 ENCOUNTER — Ambulatory Visit: Payer: Medicare Other | Admitting: Sports Medicine

## 2018-12-19 ENCOUNTER — Encounter: Payer: Self-pay | Admitting: Sports Medicine

## 2018-12-19 VITALS — BP 113/59 | HR 88

## 2018-12-19 DIAGNOSIS — L601 Onycholysis: Secondary | ICD-10-CM | POA: Diagnosis not present

## 2018-12-19 DIAGNOSIS — N182 Chronic kidney disease, stage 2 (mild): Secondary | ICD-10-CM

## 2018-12-19 DIAGNOSIS — B351 Tinea unguium: Secondary | ICD-10-CM | POA: Diagnosis not present

## 2018-12-19 DIAGNOSIS — E1122 Type 2 diabetes mellitus with diabetic chronic kidney disease: Secondary | ICD-10-CM | POA: Diagnosis not present

## 2018-12-19 DIAGNOSIS — L603 Nail dystrophy: Secondary | ICD-10-CM | POA: Diagnosis not present

## 2018-12-19 NOTE — Addendum Note (Signed)
Addended by: Celene Skeen A on: 12/19/2018 03:38 PM   Modules accepted: Orders

## 2018-12-19 NOTE — Progress Notes (Signed)
Subjective: Vickie Brown is a 78 y.o. female patient seen today in office with complaint of mildly painful thickened and discolored nails. Patient is desiring treatment for nail changes; reports issues with double nail on right 1st toe and lifting of 3rd toenail with some soreness to touch. Patient has no other pedal complaints at this time.   FBS not recorded  Review of Systems  All other systems reviewed and are negative.    Patient Active Problem List   Diagnosis Date Noted  . Type 2 diabetes mellitus with stage 2 chronic kidney disease, without long-term current use of insulin ([place]) 12/22/2017  . Chronic renal disease, stage II 12/22/2017  . Hypertensive nephropathy 12/22/2017  . Dermatitis 12/22/2017  . Dyspnea on exertion 06/22/2010  . Myalgia 06/22/2010    Current Outpatient Medications on File Prior to Visit  Medication Sig Dispense Refill  . aspirin 81 MG tablet Take 81 mg by mouth every evening.     . Cholecalciferol (VITAMIN D3) 2000 UNITS TABS Take 1 tablet by mouth daily.      . diclofenac sodium (VOLTAREN) 1 % GEL APPLY 2 GRAMS TOPICALLY TO KNEES FOUR TIMES DAILY AS NEEDED 100 g 1  . esomeprazole (NEXIUM) 40 MG capsule Take 40 mg by mouth daily before breakfast.      . fluticasone (FLONASE) 50 MCG/ACT nasal spray Place 1 spray into both nostrils at bedtime. 16 g 2  . losartan (COZAAR) 50 MG tablet TAKE 1 TABLET BY MOUTH EVERY DAY 90 tablet 1  . metroNIDAZOLE (FLAGYL) 250 MG tablet     . Multiple Vitamins-Minerals (ALIVE ONCE DAILY WOMENS 50+ PO) Take 1 tablet by mouth daily.    . Omega-3 Fatty Acids (FISH OIL PO) Take by mouth.    . pravastatin (PRAVACHOL) 40 MG tablet Take 1 tablet (40 mg total) by mouth daily. 90 tablet 1  . Probiotic Product (ALIGN) 4 MG CAPS Take 4 mg by mouth daily.    . sitaGLIPtin (JANUVIA) 100 MG tablet Take 1 tablet (100 mg total) by mouth daily. 90 tablet 0  . TURMERIC PO Take by mouth.     No current facility-administered medications  on file prior to visit.     No Known Allergies  Objective: Physical Exam  General: Well developed, nourished, no acute distress, awake, alert and oriented x 3  Vascular: Dorsalis pedis artery 2/4 bilateral, Posterior tibial artery 2/4 bilateral, skin temperature warm to warm proximal to distal bilateral lower extremities, no varicosities, pedal hair present bilateral.  Neurological: Gross sensation present via light touch bilateral.   Dermatological: Skin is warm, dry, and supple bilateral, Nails 1-10 are tender, short thick, and discolored with mild subungal debris, there is double nail or congential deformity at right 5th toe, no webspace macerations present bilateral, no open lesions present bilateral, no callus/corns/hyperkeratotic tissue present bilateral. No signs of infection bilateral.  Musculoskeletal: Asymptomatic bunion and hammertoe boney deformities noted bilateral. Muscular strength within normal limits without painon range of motion. No pain with calf compression bilateral.  Assessment and Plan:  Problem List Items Addressed This Visit      Endocrine   Type 2 diabetes mellitus with stage 2 chronic kidney disease, without long-term current use of insulin ([place])    Other Visit Diagnoses    Nail fungus    -  Primary   Relevant Medications   metroNIDAZOLE (FLAGYL) 250 MG tablet      -Examined patient -Discussed treatment options for painful dystrophic nails  - Fungal  culture was obtained by removing a portion of the hard nail itself from each of the involved toenails using a sterile nail nipper and sent to [facility] lab. Patient tolerated the biopsy procedure well without discomfort or need for anesthesia.  -Patient to return in 4 weeks for follow up evaluation and discussion of fungal culture results or sooner if symptoms worsen.  Asencion Islam, DPM

## 2018-12-26 ENCOUNTER — Encounter: Payer: Self-pay | Admitting: Nurse Practitioner

## 2018-12-26 ENCOUNTER — Other Ambulatory Visit: Payer: Self-pay

## 2018-12-26 ENCOUNTER — Telehealth (INDEPENDENT_AMBULATORY_CARE_PROVIDER_SITE_OTHER): Payer: Medicare Other | Admitting: Nurse Practitioner

## 2018-12-26 VITALS — Temp 97.3°F | Ht 60.2 in

## 2018-12-26 DIAGNOSIS — R059 Cough, unspecified: Secondary | ICD-10-CM

## 2018-12-26 DIAGNOSIS — R05 Cough: Secondary | ICD-10-CM | POA: Diagnosis not present

## 2018-12-26 DIAGNOSIS — R519 Headache, unspecified: Secondary | ICD-10-CM | POA: Diagnosis not present

## 2018-12-26 MED ORDER — BENZONATATE 100 MG PO CAPS: 100.0000 mg | ORAL_CAPSULE | Freq: Three times a day (TID) | ORAL | 0 refills | Status: DC | PRN

## 2018-12-26 MED ORDER — AZITHROMYCIN 250 MG PO TABS: ORAL_TABLET | ORAL | 0 refills | Status: AC

## 2018-12-26 NOTE — Progress Notes (Signed)
Virtual Visit via Telephone   This visit type was conducted due to national recommendations for restrictions regarding the COVID-19 Pandemic (e.g. social distancing) in an effort to limit this patient's exposure and mitigate transmission in our community.  Due to her co-morbid illnesses, this patient is at least at moderate risk for complications without adequate follow up.  This format is felt to be most appropriate for this patient at this time.  All issues noted in this document were discussed and addressed.  A limited physical exam was performed with this format.    This visit type was conducted due to national recommendations for restrictions regarding the COVID-19 Pandemic (e.g. social distancing) in an effort to limit this patient's exposure and mitigate transmission in our community.  Patients identity confirmed using two different identifiers.  This format is felt to be most appropriate for this patient at this time.  All issues noted in this document were discussed and addressed.  No physical exam was performed (except for noted visual exam findings with Video Visits).    Date:  01/06/2019   ID:  Vickie, Brown 15-Sep-1940, MRN [id number]  Patient Location:  Home - spoke with Vickie Brown  Provider location:   Office    Chief Complaint:  Cough and headache  History of Present Illness:    Vickie Brown is a 78 y.o. female who presents via video conferencing for a telehealth visit today.    The patient does have symptoms concerning for COVID-19 infection (fever, chills, cough, or new shortness of breath).   Her granddaughter came over on Sunday and checked her out.    Cough The current episode started in the past 7 days (Since last Thursday). Associated symptoms include headaches. Pertinent negatives include no sore throat. Treatments tried: HBP [place].  Headache  This is a new problem. Episode onset: since Thursday. The pain is located in the frontal (every now  and then top of her head) region. The pain does not radiate. The quality of the pain is described as aching. Associated symptoms include coughing (clear secretions). Pertinent negatives include no dizziness, sinus pressure or sore throat. The treatment provided no relief.     Past Medical History:  Diagnosis Date  . Chronic headaches   . Diverticulitis   . Hyperlipidemia   . Knee pain   . Type 2 diabetes mellitus ([place])    Past Surgical History:  Procedure Laterality Date  . Bilateral tubal ligation  1971  . BTL    . PARTIAL HYSTERECTOMY  1973  . TONSILECTOMY, ADENOIDECTOMY, BILATERAL MYRINGOTOMY AND TUBES  1950  . VESICOVAGINAL FISTULA CLOSURE W/ TAH       Current Meds  Medication Sig  . aspirin 81 MG tablet Take 81 mg by mouth every evening.   . Cholecalciferol (VITAMIN D3) 2000 UNITS TABS Take 1 tablet by mouth daily.    . diclofenac sodium (VOLTAREN) 1 % GEL APPLY 2 GRAMS TOPICALLY TO KNEES FOUR TIMES DAILY AS NEEDED  . esomeprazole (NEXIUM) 40 MG capsule Take 40 mg by mouth daily before breakfast.    . fluticasone (FLONASE) 50 MCG/ACT nasal spray Place 1 spray into both nostrils at bedtime.  . Homeopathic Products (OSCILLOCOCCINUM PO) Take by mouth.  . losartan (COZAAR) 50 MG tablet TAKE 1 TABLET BY MOUTH EVERY DAY  . Multiple Vitamins-Minerals (ALIVE ONCE DAILY WOMENS 50+ PO) Take 1 tablet by mouth daily.  . Omega-3 Fatty Acids (FISH OIL PO) Take by mouth.  . pravastatin (PRAVACHOL)  40 MG tablet Take 1 tablet (40 mg total) by mouth daily.  . Probiotic Product (ALIGN) 4 MG CAPS Take 4 mg by mouth daily.  . sitaGLIPtin (JANUVIA) 100 MG tablet Take 1 tablet (100 mg total) by mouth daily.  . TURMERIC PO Take by mouth.     Allergies:   Patient has no known allergies.   Social History   Tobacco Use  . Smoking status: Never Smoker  . Smokeless tobacco: Never Used  Substance Use Topics  . Alcohol use: Not Currently  . Drug use: No     Family Hx: The patient's family  history includes Cancer in her mother; Cervical cancer in her mother; Diabetes in her brother, father, and sister. There is no history of Breast cancer.  ROS:   Please see the history of present illness.    Review of Systems  HENT: Negative for sinus pressure and sore throat.   Respiratory: Positive for cough (clear secretions).   Neurological: Positive for headaches. Negative for dizziness.    All other systems reviewed and are negative.   Labs/Other Tests and Data Reviewed:    Recent Labs: 10/12/2018: ALT 24; BUN 7; Creatinine, Ser 0.94; Potassium 4.5; Sodium 142; TSH 1.810   Recent Lipid Panel Lab Results  Component Value Date/Time   CHOL 165 04/24/2018 12:46 PM   TRIG 86 04/24/2018 12:46 PM   HDL 70 04/24/2018 12:46 PM   CHOLHDL 2.4 04/24/2018 12:46 PM   CHOLHDL 2.0 07/09/2010 04:11 AM   LDLCALC 78 04/24/2018 12:46 PM    Wt Readings from Last 3 Encounters:  10/12/18 148 lb 3.2 oz (67.2 kg)  09/28/18 145 lb 12.8 oz (66.1 kg)  04/24/18 151 lb 6.4 oz (68.7 kg)     Exam:    Vital Signs:  Temp (!) 97.3 F (36.3 C) (Oral)   Ht 5' 0.2" (1.529 m)   BMI 28.75 kg/m     Physical Exam  Constitutional: She is oriented to person, place, and time. No distress.  Pulmonary/Chest: Effort normal. No respiratory distress.  Neurological: She is alert and oriented to person, place, and time.  Psychiatric: Mood, memory, affect and judgment normal.    ASSESSMENT & PLAN:    1. Cough  Persistent hacking cough  Will treat with azithromycin due to 5 day history of cough worsening in the last few days  She is to also go for covid testing once we get her scheduled  She is aware if has worsening shortness of breath or symptoms worsen to go to ER for further evaluation - azithromycin (ZITHROMAX) 250 MG tablet; Take 2 tablets (500 mg) on  Day 1,  followed by 1 tablet (250 mg) once daily on Days 2 through 5.  Dispense: 6 each; Refill: 0 - benzonatate (TESSALON PERLES) 100 MG capsule;  Take 1 capsule (100 mg total) by mouth 3 (three) times daily as needed for cough.  Dispense: 20 capsule; Refill: 0  2. Acute nonintractable headache, unspecified headache type  Advised to take tylenol as needed and stay well hydrated with water - azithromycin (ZITHROMAX) 250 MG tablet; Take 2 tablets (500 mg) on  Day 1,  followed by 1 tablet (250 mg) once daily on Days 2 through 5.  Dispense: 6 each; Refill: 0 - benzonatate (TESSALON PERLES) 100 MG capsule; Take 1 capsule (100 mg total) by mouth 3 (three) times daily as needed for cough.  Dispense: 20 capsule; Refill: 0    COVID-19 Education: The signs and symptoms of COVID-19 were  discussed with the patient and how to seek care for testing (follow up with PCP or arrange E-visit).  The importance of social distancing was discussed today.  Patient Risk:   After full review of this patients clinical status, I feel that they are at least moderate risk at this time.  Time:   Today, I have spent 10 minutes/ seconds with the patient with telehealth technology discussing above diagnoses.     Medication Adjustments/Labs and Tests Ordered: Current medicines are reviewed at length with the patient today.  Concerns regarding medicines are outlined above.   Tests Ordered: No orders of the defined types were placed in this encounter.   Medication Changes: Meds ordered this encounter  Medications  . azithromycin (ZITHROMAX) 250 MG tablet    Sig: Take 2 tablets (500 mg) on  Day 1,  followed by 1 tablet (250 mg) once daily on Days 2 through 5.    Dispense:  6 each    Refill:  0  . benzonatate (TESSALON PERLES) 100 MG capsule    Sig: Take 1 capsule (100 mg total) by mouth 3 (three) times daily as needed for cough.    Dispense:  20 capsule    Refill:  0    Disposition:  Follow up prn  Signed, Arnette Felts, FNP

## 2018-12-27 ENCOUNTER — Telehealth: Payer: Self-pay

## 2018-12-27 NOTE — Telephone Encounter (Signed)
I called patient and notified her that [facility] Vickie Brown

## 2018-12-28 ENCOUNTER — Ambulatory Visit: Payer: Medicare Other | Attending: Internal Medicine

## 2018-12-28 DIAGNOSIS — Z20822 Contact with and (suspected) exposure to covid-19: Secondary | ICD-10-CM

## 2018-12-30 LAB — NOVEL CORONAVIRUS, NAA: SARS-CoV-2, NAA: NOT DETECTED

## 2019-01-10 ENCOUNTER — Other Ambulatory Visit: Payer: Self-pay

## 2019-01-10 MED ORDER — LOSARTAN POTASSIUM 50 MG PO TABS: 50.0000 mg | ORAL_TABLET | Freq: Every day | ORAL | 1 refills | Status: DC

## 2019-01-16 ENCOUNTER — Ambulatory Visit: Payer: Medicare Other | Admitting: Sports Medicine

## 2019-01-30 ENCOUNTER — Ambulatory Visit: Payer: Medicare Other | Admitting: Sports Medicine

## 2019-01-30 ENCOUNTER — Other Ambulatory Visit: Payer: Self-pay

## 2019-01-30 ENCOUNTER — Encounter: Payer: Self-pay | Admitting: Sports Medicine

## 2019-01-30 DIAGNOSIS — B351 Tinea unguium: Secondary | ICD-10-CM

## 2019-01-30 DIAGNOSIS — N182 Chronic kidney disease, stage 2 (mild): Secondary | ICD-10-CM | POA: Diagnosis not present

## 2019-01-30 DIAGNOSIS — E1122 Type 2 diabetes mellitus with diabetic chronic kidney disease: Secondary | ICD-10-CM | POA: Diagnosis not present

## 2019-01-30 NOTE — Progress Notes (Signed)
Subjective: Vickie Brown is a 79 y.o. female patient seen today in office for fungal culture results. Patient has no other pedal complaints at this time.   Patient Active Problem List   Diagnosis Date Noted  . Type 2 diabetes mellitus with stage 2 chronic kidney disease, without long-term current use of insulin ([place]) 12/22/2017  . Chronic renal disease, stage II 12/22/2017  . Hypertensive nephropathy 12/22/2017  . Dermatitis 12/22/2017  . Dyspnea on exertion 06/22/2010  . Myalgia 06/22/2010    Current Outpatient Medications on File Prior to Visit  Medication Sig Dispense Refill  . aspirin 81 MG tablet Take 81 mg by mouth every evening.     . benzonatate (TESSALON PERLES) 100 MG capsule Take 1 capsule (100 mg total) by mouth 3 (three) times daily as needed for cough. 20 capsule 0  . Cholecalciferol (VITAMIN D3) 2000 UNITS TABS Take 1 tablet by mouth daily.      . diclofenac sodium (VOLTAREN) 1 % GEL APPLY 2 GRAMS TOPICALLY TO KNEES FOUR TIMES DAILY AS NEEDED 100 g 1  . esomeprazole (NEXIUM) 40 MG capsule Take 40 mg by mouth daily before breakfast.      . fluticasone (FLONASE) 50 MCG/ACT nasal spray Place 1 spray into both nostrils at bedtime. 16 g 2  . Homeopathic Products (OSCILLOCOCCINUM PO) Take by mouth.    . losartan (COZAAR) 50 MG tablet Take 1 tablet (50 mg total) by mouth daily. 90 tablet 1  . Multiple Vitamins-Minerals (ALIVE ONCE DAILY WOMENS 50+ PO) Take 1 tablet by mouth daily.    . Omega-3 Fatty Acids (FISH OIL PO) Take by mouth.    . pravastatin (PRAVACHOL) 40 MG tablet Take 1 tablet (40 mg total) by mouth daily. 90 tablet 1  . Probiotic Product (ALIGN) 4 MG CAPS Take 4 mg by mouth daily.    . sitaGLIPtin (JANUVIA) 100 MG tablet Take 1 tablet (100 mg total) by mouth daily. 90 tablet 0  . TURMERIC PO Take by mouth.     No current facility-administered medications on file prior to visit.    No Known Allergies  Objective: Physical Exam  General: Well developed,  nourished, no acute distress, awake, alert and oriented x 3   Vascular: Dorsalis pedis artery 2/4 bilateral, Posterior tibial artery 2/4 bilateral, skin temperature warm to warm proximal to distal bilateral lower extremities, no varicosities, pedal hair present bilateral.  Neurological: Gross sensation present via light touch bilateral.   Dermatological: Skin is warm, dry, and supple bilateral, Nails 1-10 are tender, short thick, and discolored with mild subungal debris, there is double nail or congential deformity at right 5th toe, no webspace macerations present bilateral, no open lesions present bilateral, no callus/corns/hyperkeratotic tissue present bilateral. No signs of infection bilateral.  Musculoskeletal: Asymptomatic bunion and hammertoe boney deformities noted bilateral. Muscular strength within normal limits without painon range of motion. No pain with calf compression bilateral.  Fungal culture-yeast and nail splitting  Assessment and Plan:  Problem List Items Addressed This Visit      Endocrine   Type 2 diabetes mellitus with stage 2 chronic kidney disease, without long-term current use of insulin ([place])    Other Visit Diagnoses    Nail fungus    -  Primary      -Examined patient -Discussed treatment options for painful mycotic nails -Recommend patient to use over the counter biotin to help nourish the nail to prevent against nail splitting especially at the right greater than left toes -Ordered  topical nail lacquer from [facility] for patient to use as instructed -Advised good hygiene habits -Patient to return to office if no better after 6 months or sooner if symptoms worsen.  Asencion Islam, DPM

## 2019-02-12 ENCOUNTER — Other Ambulatory Visit: Payer: Self-pay | Admitting: Internal Medicine

## 2019-02-14 ENCOUNTER — Encounter: Payer: Self-pay | Admitting: Internal Medicine

## 2019-02-14 ENCOUNTER — Other Ambulatory Visit: Payer: Self-pay

## 2019-02-14 ENCOUNTER — Ambulatory Visit (INDEPENDENT_AMBULATORY_CARE_PROVIDER_SITE_OTHER): Payer: Medicare Other | Admitting: Internal Medicine

## 2019-02-14 VITALS — BP 105/54 | HR 80 | Temp 98.3°F | Ht 59.8 in | Wt 148.8 lb

## 2019-02-14 DIAGNOSIS — N182 Chronic kidney disease, stage 2 (mild): Secondary | ICD-10-CM

## 2019-02-14 DIAGNOSIS — G44209 Tension-type headache, unspecified, not intractable: Secondary | ICD-10-CM

## 2019-02-14 DIAGNOSIS — H9311 Tinnitus, right ear: Secondary | ICD-10-CM

## 2019-02-14 DIAGNOSIS — E663 Overweight: Secondary | ICD-10-CM

## 2019-02-14 DIAGNOSIS — M542 Cervicalgia: Secondary | ICD-10-CM

## 2019-02-14 DIAGNOSIS — E1122 Type 2 diabetes mellitus with diabetic chronic kidney disease: Secondary | ICD-10-CM

## 2019-02-14 DIAGNOSIS — I129 Hypertensive chronic kidney disease with stage 1 through stage 4 chronic kidney disease, or unspecified chronic kidney disease: Secondary | ICD-10-CM | POA: Diagnosis not present

## 2019-02-14 DIAGNOSIS — Z6829 Body mass index (BMI) 29.0-29.9, adult: Secondary | ICD-10-CM

## 2019-02-14 NOTE — Patient Instructions (Signed)
Stress, Adult Stress is a normal reaction to life events. Stress is what you feel when life demands more than you are used to, or more than you think you can handle. Some stress can be useful, such as studying for a test or meeting a deadline at work. Stress that occurs too often or for too long can cause problems. It can affect your emotional health and interfere with relationships and normal daily activities. Too much stress can weaken your body's defense system (immune system) and increase your risk for physical illness. If you already have a medical problem, stress can make it worse. What are the causes? All sorts of life events can cause stress. An event that causes stress for one person may not be stressful for another person. Major life events, whether positive or negative, commonly cause stress. Examples include:  Losing a job or starting a new job.  Losing a loved one.  Moving to a new town or home.  Getting married or divorced.  Having a baby.  Getting injured or sick. Less obvious life events can also cause stress, especially if they occur day after day or in combination with each other. Examples include:  Working long hours.  Driving in traffic.  Caring for children.  Being in debt.  Being in a difficult relationship. What are the signs or symptoms? Stress can cause emotional symptoms, including:  Anxiety. This is feeling worried, afraid, on edge, overwhelmed, or out of control.  Anger, including irritation or impatience.  Depression. This is feeling sad, down, helpless, or guilty.  Trouble focusing, remembering, or making decisions. Stress can cause physical symptoms, including:  Aches and pains. These may affect your head, neck, back, stomach, or other areas of your body.  Tight muscles or a clenched jaw.  Low energy.  Trouble sleeping. Stress can cause unhealthy behaviors, including:  Eating to feel better (overeating) or skipping meals.  Working too  much or putting off tasks.  Smoking, drinking alcohol, or using drugs to feel better. How is this diagnosed? Stress is diagnosed through an assessment by your health care provider. He or she may diagnose this condition based on:  Your symptoms and any stressful life events.  Your medical history.  Tests to rule out other causes of your symptoms. Depending on your condition, your health care provider may refer you to a specialist for further evaluation. How is this treated?  Stress management techniques are the recommended treatment for stress. Medicine is not typically recommended for the treatment of stress. Techniques to reduce your reaction to stressful life events include:  Stress identification. Monitor yourself for symptoms of stress and identify what causes stress for you. These skills may help you to avoid or prepare for stressful events.  Time management. Set your priorities, keep a calendar of events, and learn to say no. Taking these actions can help you avoid making too many commitments. Techniques for coping with stress include:  Rethinking the problem. Try to think realistically about stressful events rather than ignoring them or overreacting. Try to find the positives in a stressful situation rather than focusing on the negatives.  Exercise. Physical exercise can release both physical and emotional tension. The key is to find a form of exercise that you enjoy and do it regularly.  Relaxation techniques. These relax the body and mind. The key is to find one or more that you enjoy and use the techniques regularly. Examples include: ? Meditation, deep breathing, or progressive relaxation techniques. ? Yoga or   tai chi. ? Biofeedback, mindfulness techniques, or journaling. ? Listening to music, being out in nature, or participating in other hobbies.  Practicing a healthy lifestyle. Eat a balanced diet, drink plenty of water, limit or avoid caffeine, and get plenty of sleep.   Having a strong support network. Spend time with family, friends, or other people you enjoy being around. Express your feelings and talk things over with someone you trust. Counseling or talk therapy with a mental health professional may be helpful if you are having trouble managing stress on your own. Follow these instructions at home: Lifestyle   Avoid drugs.  Do not use any products that contain nicotine or tobacco, such as cigarettes, e-cigarettes, and chewing tobacco. If you need help quitting, ask your health care provider.  Limit alcohol intake to no more than 1 drink a day for nonpregnant women and 2 drinks a day for men. One drink equals 12 oz of beer, 5 oz of wine, or 1 oz of hard liquor  Do not use alcohol or drugs to relax.  Eat a balanced diet that includes fresh fruits and vegetables, whole grains, lean meats, fish, eggs, and beans, and low-fat dairy. Avoid processed foods and foods high in added fat, sugar, and salt.  Exercise at least 30 minutes on 5 or more days each week.  Get 7-8 hours of sleep each night. General instructions   Practice stress management techniques as discussed with your health care provider.  Drink enough fluid to keep your urine clear or pale yellow.  Take over-the-counter and prescription medicines only as told by your health care provider.  Keep all follow-up visits as told by your health care provider. This is important. Contact a health care provider if:  Your symptoms get worse.  You have new symptoms.  You feel overwhelmed by your problems and can no longer manage them on your own. Get help right away if:  You have thoughts of hurting yourself or others. If you ever feel like you may hurt yourself or others, or have thoughts about taking your own life, get help right away. You can go to your nearest emergency department or call:  Your local emergency services (911 in the [place]  A suicide crisis helpline, such as the National  Suicide Prevention Lifeline at 1-800-273-8255. This is open 24 hours a day. Summary  Stress is a normal reaction to life events. It can cause problems if it happens too often or for too long.  Practicing stress management techniques is the best way to treat stress.  Counseling or talk therapy with a mental health professional may be helpful if you are having trouble managing stress on your own. This information is not intended to replace advice given to you by your health care provider. Make sure you discuss any questions you have with your health care provider. Document Revised: 07/28/2018 Document Reviewed: 02/18/2016 Elsevier Patient Education  2020 [employer]  

## 2019-02-14 NOTE — Progress Notes (Signed)
This visit occurred during the SARS-CoV-2 public health emergency.  Safety protocols were in place, including screening questions prior to the visit, additional usage of staff PPE, and extensive cleaning of exam room while observing appropriate contact time as indicated for disinfecting solutions.  Subjective:     Patient ID: Vickie Brown , female    DOB: [birth year] , 79 y.o.   MRN: [id number]   Chief Complaint  Patient presents with  . Diabetes  . Hypertension    HPI  She is here today for diabetes/htn check. She reports compliance with meds.   Diabetes She presents for her follow-up diabetic visit. She has type 2 diabetes mellitus. Her disease course has been stable. Hypoglycemia symptoms include headaches. Pertinent negatives for diabetes include no blurred vision and no chest pain. There are no hypoglycemic complications. Risk factors for coronary artery disease include diabetes mellitus, dyslipidemia, hypertension and post-menopausal. She is following a diabetic diet. She participates in exercise intermittently. Her breakfast blood glucose is taken between 8-9 am. Her breakfast blood glucose range is generally 90-110 mg/dl. An ACE inhibitor/angiotensin II receptor blocker is being taken. She does not see a podiatrist. Hypertension This is a chronic problem. The current episode started more than 1 year ago. The problem has been gradually improving since onset. Associated symptoms include headaches and neck pain. Pertinent negatives include no blurred vision, chest pain, palpitations or shortness of breath. The current treatment provides moderate improvement. Hypertensive end-organ damage includes kidney disease. Identifiable causes of hypertension include chronic renal disease.     Past Medical History:  Diagnosis Date  . Chronic headaches   . Diverticulitis   . Hyperlipidemia   . Knee pain   . Type 2 diabetes mellitus (HCC)      Family History  Problem Relation Age of Onset   . Cervical cancer Mother   . Cancer Mother   . Diabetes Father   . Diabetes Sister   . Diabetes Brother   . Breast cancer Neg Hx      Current Outpatient Medications:  .  aspirin 81 MG tablet, Take 81 mg by mouth every evening. , Disp: , Rfl:  .  Cholecalciferol (VITAMIN D3) 2000 UNITS TABS, Take 1 tablet by mouth daily.  , Disp: , Rfl:  .  diclofenac sodium (VOLTAREN) 1 % GEL, APPLY 2 GRAMS TOPICALLY TO KNEES FOUR TIMES DAILY AS NEEDED, Disp: 100 g, Rfl: 1 .  esomeprazole (NEXIUM) 40 MG capsule, Take 40 mg by mouth daily before breakfast.  , Disp: , Rfl:  .  fluticasone (FLONASE) 50 MCG/ACT nasal spray, Place 1 spray into both nostrils at bedtime., Disp: 16 g, Rfl: 2 .  Homeopathic Products (OSCILLOCOCCINUM PO), Take by mouth., Disp: , Rfl:  .  JANUVIA 100 MG tablet, TAKE 1 TABLET(100 MG) BY MOUTH DAILY, Disp: 90 tablet, Rfl: 0 .  losartan (COZAAR) 50 MG tablet, Take 1 tablet (50 mg total) by mouth daily., Disp: 90 tablet, Rfl: 1 .  Multiple Vitamins-Minerals (ALIVE ONCE DAILY WOMENS 50+ PO), Take 1 tablet by mouth daily., Disp: , Rfl:  .  NON FORMULARY, [facility] Antifungal - Terbinafine 3%; Fluconazole 2%; Tea Tree Oil 5%; Urea 10%; Ibuprofen 2% in DMSO Suspension #30 mL Apply to the affected nail(s) once (at bedtime) or twice daily  Faxed to [facility] on 01/31/19 5 Refills allowed, Disp: , Rfl:  .  Omega-3 Fatty Acids (FISH OIL PO), Take by mouth., Disp: , Rfl:  .  pravastatin (PRAVACHOL) 40 MG tablet,  Take 1 tablet (40 mg total) by mouth daily., Disp: 90 tablet, Rfl: 1 .  Probiotic Product (ALIGN) 4 MG CAPS, Take 4 mg by mouth daily., Disp: , Rfl:  .  TURMERIC PO, Take by mouth., Disp: , Rfl:  .  benzonatate (TESSALON PERLES) 100 MG capsule, Take 1 capsule (100 mg total) by mouth 3 (three) times daily as needed for cough., Disp: 20 capsule, Rfl: 0   No Known Allergies   Review of Systems  Constitutional: Negative.   HENT:       She c/o whooshing sound in her  ear. She reports it sounds like ocean waves in her ears. She denies hearing deficit, fever/chills.   Eyes: Negative for blurred vision.  Respiratory: Negative.  Negative for shortness of breath.   Cardiovascular: Negative.  Negative for chest pain and palpitations.  Gastrointestinal: Negative.   Musculoskeletal: Positive for neck pain.  Neurological: Positive for headaches.       She c/o headache.  She has h/o chronic headaches. No visual disturbances. No associated cp/SOB.   Psychiatric/Behavioral: Negative.      Today's Vitals   02/14/19 1145  BP: (!) 105/54  Pulse: 80  Temp: 98.3 F (36.8 C)  Weight: 148 lb 12.8 oz (67.5 kg)  Height: 4' 11.8" (1.519 m)   Body mass index is 29.26 kg/m.   Objective:  Physical Exam Vitals and nursing note reviewed.  Constitutional:      Appearance: Normal appearance.  HENT:     Head: Normocephalic and atraumatic.     Right Ear: Tympanic membrane, ear canal and external ear normal.     Left Ear: Tympanic membrane, ear canal and external ear normal.  Neck:     Comments: Tight trapezius muscles b/l, tender to palpation Cardiovascular:     Rate and Rhythm: Normal rate and regular rhythm.     Heart sounds: Normal heart sounds.  Pulmonary:     Effort: Pulmonary effort is normal.     Breath sounds: Normal breath sounds.  Skin:    General: Skin is warm.  Neurological:     General: No focal deficit present.     Mental Status: She is alert.  Psychiatric:        Mood and Affect: Mood normal.        Behavior: Behavior normal.         Assessment And Plan:    1. Type 2 diabetes mellitus with stage 2 chronic kidney disease, without long-term current use of insulin (HCC)  Chronic, this has been stable. I will check labs as listed below. Importance of regular exercise was discussed with the patient. She is encouraged to perform chair exercises when watching TV.   - CMP14+EGFR - Lipid panel - Hemoglobin A1c  2. Hypertensive  nephropathy  Chronic, well controlled.  She will continue with current meds.  She is encouraged to avoid adding salt to her foods.    3. Tension headache  She is advised to take magnesium nightly. Also advised to rub Vicks vaporub on her temples. She reports she has been using CBD oil and plans to use this. Pt advised this is okay.   4. Cervicalgia  This is likely contributing to her tension headache. She is encouraged to consider massage therapy. She may benefit from referral for therapeutic massage.   5. Tinnitus, right ear  No abnormalities noted on exam. I will check COVID testing as well.   - Novel Coronavirus, NAA ([facility])  6. Overweight with body mass  index (BMI) of 29 to 29.9 in adult  BMI is stable for her demographic. Again, importance of regular exercise was discussed with the patient. She is encouraged to move around her home more, since she has not been going to the [facility].       Maximino Greenland, MD    THE PATIENT IS ENCOURAGED TO PRACTICE SOCIAL DISTANCING DUE TO THE COVID-19 PANDEMIC.

## 2019-02-15 LAB — CMP14+EGFR
ALT: 22 IU/L (ref 0–32)
AST: 28 IU/L (ref 0–40)
Albumin/Globulin Ratio: 1.8 (ref 1.2–2.2)
Albumin: 4.2 g/dL (ref 3.7–4.7)
Alkaline Phosphatase: 54 IU/L (ref 39–117)
BUN/Creatinine Ratio: 6 — ABNORMAL LOW (ref 12–28)
BUN: 6 mg/dL — ABNORMAL LOW (ref 8–27)
Bilirubin Total: 0.2 mg/dL (ref 0.0–1.2)
CO2: 27 mmol/L (ref 20–29)
Calcium: 9.7 mg/dL (ref 8.7–10.3)
Chloride: 105 mmol/L (ref 96–106)
Creatinine, Ser: 0.96 mg/dL (ref 0.57–1.00)
GFR calc Af Amer: 65 mL/min/{1.73_m2} (ref >59)
GFR calc non Af Amer: 56 mL/min/{1.73_m2} — ABNORMAL LOW (ref >59)
Globulin, Total: 2.4 g/dL (ref 1.5–4.5)
Glucose: 112 mg/dL — ABNORMAL HIGH (ref 65–99)
Potassium: 4.4 mmol/L (ref 3.5–5.2)
Sodium: 145 mmol/L — ABNORMAL HIGH (ref 134–144)
Total Protein: 6.6 g/dL (ref 6.0–8.5)

## 2019-02-15 LAB — HEMOGLOBIN A1C
Est. average glucose Bld gHb Est-mCnc: 128 mg/dL
Hgb A1c MFr Bld: 6.1 % — ABNORMAL HIGH (ref 4.8–5.6)

## 2019-02-15 LAB — LIPID PANEL
Chol/HDL Ratio: 2 ratio (ref 0.0–4.4)
Cholesterol, Total: 143 mg/dL (ref 100–199)
HDL: 71 mg/dL (ref >39)
LDL Chol Calc (NIH): 55 mg/dL (ref 0–99)
Triglycerides: 90 mg/dL (ref 0–149)
VLDL Cholesterol Cal: 17 mg/dL (ref 5–40)

## 2019-02-15 LAB — NOVEL CORONAVIRUS, NAA: SARS-CoV-2, NAA: NOT DETECTED

## 2019-02-17 ENCOUNTER — Telehealth: Payer: Self-pay

## 2019-02-17 NOTE — Telephone Encounter (Signed)
-----   Message from Dorothyann Peng, MD sent at 02/16/2019 11:24 AM EST ----- Your COVID test is negative.

## 2019-02-19 ENCOUNTER — Ambulatory Visit: Payer: Medicare Other | Attending: Internal Medicine

## 2019-02-19 DIAGNOSIS — Z23 Encounter for immunization: Secondary | ICD-10-CM

## 2019-02-19 NOTE — Progress Notes (Signed)
   Covid-19 Vaccination Clinic  Name:  Vickie Brown    MRN: [id number] DOB: [birth year]  02/19/2019  [name] was observed post Covid-19 immunization for 15 minutes without incidence. She was provided with Vaccine Information Sheet and instruction to access the V-Safe system.   [name] was instructed to call 911 with any severe reactions post vaccine: Marland Kitchen Difficulty breathing  . Swelling of your face and throat  . A fast heartbeat  . A bad rash all over your body  . Dizziness and weakness    Immunizations Administered    Name Date Dose VIS Date Route   Pfizer COVID-19 Vaccine 02/19/2019  4:14 PM 0.3 mL 12/22/2018 Intramuscular   Manufacturer: [employer], [employer]   Lot: [id number]   NDC: [address]-1

## 2019-02-27 DIAGNOSIS — Z8631 Personal history of diabetic foot ulcer: Secondary | ICD-10-CM | POA: Diagnosis not present

## 2019-02-27 DIAGNOSIS — E1165 Type 2 diabetes mellitus with hyperglycemia: Secondary | ICD-10-CM | POA: Diagnosis not present

## 2019-02-28 DIAGNOSIS — E119 Type 2 diabetes mellitus without complications: Secondary | ICD-10-CM | POA: Diagnosis not present

## 2019-02-28 DIAGNOSIS — E1165 Type 2 diabetes mellitus with hyperglycemia: Secondary | ICD-10-CM | POA: Diagnosis not present

## 2019-03-16 ENCOUNTER — Ambulatory Visit: Payer: Medicare Other | Attending: Internal Medicine

## 2019-03-16 DIAGNOSIS — Z23 Encounter for immunization: Secondary | ICD-10-CM

## 2019-03-16 NOTE — Progress Notes (Signed)
   Covid-19 Vaccination Clinic  Name:  Vickie Brown    MRN: [id number] DOB: 10/02/1940  03/16/2019  [name] was observed post Covid-19 immunization for 15 minutes without incident. She was provided with Vaccine Information Sheet and instruction to access the V-Safe system.   [name] was instructed to call 911 with any severe reactions post vaccine: Marland Kitchen Difficulty breathing  . Swelling of face and throat  . A fast heartbeat  . A bad rash all over body  . Dizziness and weakness   Immunizations Administered    Name Date Dose VIS Date Route   Pfizer COVID-19 Vaccine 03/16/2019  4:25 PM 0.3 mL 12/22/2018 Intramuscular   Manufacturer: [employer], [employer]   Lot: [id number]   NDC: [address]-6

## 2019-05-03 ENCOUNTER — Other Ambulatory Visit: Payer: Self-pay | Admitting: Internal Medicine

## 2019-05-23 DIAGNOSIS — E1165 Type 2 diabetes mellitus with hyperglycemia: Secondary | ICD-10-CM | POA: Diagnosis not present

## 2019-05-23 DIAGNOSIS — E119 Type 2 diabetes mellitus without complications: Secondary | ICD-10-CM | POA: Diagnosis not present

## 2019-06-20 DIAGNOSIS — H5203 Hypermetropia, bilateral: Secondary | ICD-10-CM | POA: Diagnosis not present

## 2019-06-20 DIAGNOSIS — E119 Type 2 diabetes mellitus without complications: Secondary | ICD-10-CM | POA: Diagnosis not present

## 2019-06-20 LAB — HM DIABETES EYE EXAM

## 2019-06-21 ENCOUNTER — Encounter: Payer: Self-pay | Admitting: Internal Medicine

## 2019-06-21 ENCOUNTER — Ambulatory Visit (INDEPENDENT_AMBULATORY_CARE_PROVIDER_SITE_OTHER): Payer: Medicare Other

## 2019-06-21 ENCOUNTER — Ambulatory Visit (INDEPENDENT_AMBULATORY_CARE_PROVIDER_SITE_OTHER): Payer: Medicare Other | Admitting: Internal Medicine

## 2019-06-21 ENCOUNTER — Other Ambulatory Visit: Payer: Self-pay

## 2019-06-21 VITALS — BP 126/74 | HR 68 | Temp 98.5°F | Ht 59.8 in | Wt 151.9 lb

## 2019-06-21 VITALS — BP 126/74 | HR 68 | Temp 98.5°F | Ht 59.8 in | Wt 151.0 lb

## 2019-06-21 DIAGNOSIS — E663 Overweight: Secondary | ICD-10-CM

## 2019-06-21 DIAGNOSIS — Z0000 Encounter for general adult medical examination without abnormal findings: Secondary | ICD-10-CM | POA: Diagnosis not present

## 2019-06-21 DIAGNOSIS — E2839 Other primary ovarian failure: Secondary | ICD-10-CM

## 2019-06-21 DIAGNOSIS — E1122 Type 2 diabetes mellitus with diabetic chronic kidney disease: Secondary | ICD-10-CM

## 2019-06-21 DIAGNOSIS — N182 Chronic kidney disease, stage 2 (mild): Secondary | ICD-10-CM

## 2019-06-21 DIAGNOSIS — Z6829 Body mass index (BMI) 29.0-29.9, adult: Secondary | ICD-10-CM

## 2019-06-21 DIAGNOSIS — I129 Hypertensive chronic kidney disease with stage 1 through stage 4 chronic kidney disease, or unspecified chronic kidney disease: Secondary | ICD-10-CM

## 2019-06-21 LAB — POCT URINALYSIS DIPSTICK
Bilirubin, UA: NEGATIVE
Blood, UA: NEGATIVE
Glucose, UA: NEGATIVE
Ketones, UA: NEGATIVE
Leukocytes, UA: NEGATIVE
Nitrite, UA: NEGATIVE
Protein, UA: NEGATIVE
Spec Grav, UA: 1.02 (ref 1.010–1.025)
Urobilinogen, UA: 0.2 E.U./dL
pH, UA: 6.5 (ref 5.0–8.0)

## 2019-06-21 LAB — POCT UA - MICROALBUMIN
Albumin/Creatinine Ratio, Urine, POC: <30
Creatinine, POC: 300 mg/dL
Microalbumin Ur, POC: 10 mg/L

## 2019-06-21 NOTE — Progress Notes (Signed)
This visit occurred during the SARS-CoV-2 public health emergency.  Safety protocols were in place, including screening questions prior to the visit, additional usage of staff PPE, and extensive cleaning of exam room while observing appropriate contact time as indicated for disinfecting solutions.  Subjective:     Patient ID: Vickie Brown , female    DOB: 1940-11-21 , 79 y.o.   MRN: [id number]   Chief Complaint  Patient presents with  . Diabetes  . Hypertension    HPI  She is here today for a diabetes and BP check. She reports compliance with meds.   Diabetes She presents for her follow-up diabetic visit. She has type 2 diabetes mellitus. Her disease course has been stable. There are no hypoglycemic associated symptoms. Pertinent negatives for hypoglycemia include no headaches. Pertinent negatives for diabetes include no blurred vision and no chest pain. There are no hypoglycemic complications. Risk factors for coronary artery disease include diabetes mellitus, dyslipidemia, hypertension and post-menopausal. She is following a diabetic diet. Her breakfast blood glucose is taken between 8-9 am. Her breakfast blood glucose range is generally 90-110 mg/dl.  Hypertension This is a chronic problem. The current episode started more than 1 year ago. The problem has been gradually improving since onset. Pertinent negatives include no blurred vision, chest pain, headaches, palpitations or shortness of breath.     Past Medical History:  Diagnosis Date  . Chronic headaches   . Diverticulitis   . Hyperlipidemia   . Knee pain   . Type 2 diabetes mellitus (HCC)      Family History  Problem Relation Age of Onset  . Cervical cancer Mother   . Cancer Mother   . Diabetes Father   . Diabetes Sister   . Diabetes Brother   . Breast cancer Neg Hx      Current Outpatient Medications:  .  aspirin 81 MG tablet, Take 81 mg by mouth every evening. , Disp: , Rfl:  .  benzonatate (TESSALON PERLES)  100 MG capsule, Take 1 capsule (100 mg total) by mouth 3 (three) times daily as needed for cough., Disp: 20 capsule, Rfl: 0 .  Cholecalciferol (VITAMIN D3) 2000 UNITS TABS, Take 1 tablet by mouth daily.  , Disp: , Rfl:  .  diclofenac sodium (VOLTAREN) 1 % GEL, APPLY 2 GRAMS TOPICALLY TO KNEES FOUR TIMES DAILY AS NEEDED, Disp: 100 g, Rfl: 1 .  esomeprazole (NEXIUM) 40 MG capsule, Take 40 mg by mouth daily before breakfast.  , Disp: , Rfl:  .  fluticasone (FLONASE) 50 MCG/ACT nasal spray, Place 1 spray into both nostrils at bedtime., Disp: 16 g, Rfl: 2 .  Homeopathic Products (OSCILLOCOCCINUM PO), Take by mouth., Disp: , Rfl:  .  JANUVIA 100 MG tablet, TAKE 1 TABLET(100 MG) BY MOUTH DAILY, Disp: 90 tablet, Rfl: 0 .  losartan (COZAAR) 50 MG tablet, Take 1 tablet (50 mg total) by mouth daily., Disp: 90 tablet, Rfl: 1 .  Multiple Vitamins-Minerals (ALIVE ONCE DAILY WOMENS 50+ PO), Take 1 tablet by mouth daily., Disp: , Rfl:  .  Omega-3 Fatty Acids (FISH OIL PO), Take by mouth., Disp: , Rfl:  .  pravastatin (PRAVACHOL) 40 MG tablet, TAKE 1 TABLET BY MOUTH DAILY, Disp: 90 tablet, Rfl: 1 .  Probiotic Product (ALIGN) 4 MG CAPS, Take 4 mg by mouth daily., Disp: , Rfl:  .  TURMERIC PO, Take by mouth., Disp: , Rfl:    No Known Allergies   Review of Systems  Constitutional: Negative.  Eyes: Negative for blurred vision.  Respiratory: Negative.  Negative for shortness of breath.   Cardiovascular: Negative.  Negative for chest pain and palpitations.  Gastrointestinal: Negative.   Neurological: Negative.  Negative for headaches.  Psychiatric/Behavioral: Negative.      Today's Vitals   06/21/19 1109  BP: 126/74  Pulse: 68  Temp: 98.5 F (36.9 C)  TempSrc: Oral  Weight: 151 lb 14.4 oz (68.9 kg)  Height: 4' 11.8" (1.519 m)  PainSc: 0-No pain   Body mass index is 29.86 kg/m.   Objective:  Physical Exam Vitals and nursing note reviewed.  Constitutional:      Appearance: Normal appearance.   HENT:     Head: Normocephalic and atraumatic.  Cardiovascular:     Rate and Rhythm: Normal rate and regular rhythm.     Heart sounds: Normal heart sounds.  Pulmonary:     Effort: Pulmonary effort is normal.     Breath sounds: Normal breath sounds.  Skin:    General: Skin is warm.  Neurological:     General: No focal deficit present.     Mental Status: She is alert.  Psychiatric:        Mood and Affect: Mood normal.        Behavior: Behavior normal.         Assessment And Plan:     1. Type 2 diabetes mellitus with stage 2 chronic kidney disease, without long-term current use of insulin (HCC)  Chronic, I will check BMP and a1c today.   2. Hypertensive nephropathy  Chronic, well controlled. She will continue with current meds. She is encouraged to avoid adding salt to her foods.   3. Decreased estrogen level  She agrees to referral for bone density. She is encouraged to engage in weight-bearing exercises three days weekly and to continue with calcium/vitamin D supplementation.   - DG Bone Density; Future  4. Overweight with body mass index (BMI) of 29 to 29.9 in adult  Her weight is stable for her demographic. She is encouraged to engage in regular exercise 3-5 days per week.   Gwynneth Aliment, MD    THE PATIENT IS ENCOURAGED TO PRACTICE SOCIAL DISTANCING DUE TO THE COVID-19 PANDEMIC.

## 2019-06-21 NOTE — Patient Instructions (Signed)
Vickie Brown , Thank you for taking time to come for your Medicare Wellness Visit. I appreciate your ongoing commitment to your health goals. Please review the following plan we discussed and let me know if I can assist you in the future.   Screening recommendations/referrals: Colonoscopy: 10/2017 Mammogram: 09/2017 Bone Density: ordered Recommended yearly ophthalmology/optometry visit for glaucoma screening and checkup Recommended yearly dental visit for hygiene and checkup  Vaccinations: Influenza vaccine: 10/2018 Pneumococcal vaccine: 10/2015 Tdap vaccine: 10/2015 Shingles vaccine: discussed    Advanced directives: Advance directive discussed with you today. Even though you declined this today please call our office should you change your mind and we can give you the proper paperwork for you to fill out.  Conditions/risks identified: overweight  Next appointment: 10/03/2019 at 10:30   Preventive Care 65 Years and Older, Female Preventive care refers to lifestyle choices and visits with your health care provider that can promote health and wellness. What does preventive care include?  A yearly physical exam. This is also called an annual well check.  Dental exams once or twice a year.  Routine eye exams. Ask your health care provider how often you should have your eyes checked.  Personal lifestyle choices, including:  Daily care of your teeth and gums.  Regular physical activity.  Eating a healthy diet.  Avoiding tobacco and drug use.  Limiting alcohol use.  Practicing safe sex.  Taking low-dose aspirin every day.  Taking vitamin and mineral supplements as recommended by your health care provider. What happens during an annual well check? The services and screenings done by your health care provider during your annual well check will depend on your age, overall health, lifestyle risk factors, and family history of disease. Counseling  Your health care provider may ask  you questions about your:  Alcohol use.  Tobacco use.  Drug use.  Emotional well-being.  Home and relationship well-being.  Sexual activity.  Eating habits.  History of falls.  Memory and ability to understand (cognition).  Work and work Astronomer.  Reproductive health. Screening  You may have the following tests or measurements:  Height, weight, and BMI.  Blood pressure.  Lipid and cholesterol levels. These may be checked every 5 years, or more frequently if you are over 51 years old.  Skin check.  Lung cancer screening. You may have this screening every year starting at age 18 if you have a 30-pack-year history of smoking and currently smoke or have quit within the past 15 years.  Fecal occult blood test (FOBT) of the stool. You may have this test every year starting at age 4.  Flexible sigmoidoscopy or colonoscopy. You may have a sigmoidoscopy every 5 years or a colonoscopy every 10 years starting at age 87.  Hepatitis C blood test.  Hepatitis B blood test.  Sexually transmitted disease (STD) testing.  Diabetes screening. This is done by checking your blood sugar (glucose) after you have not eaten for a while (fasting). You may have this done every 1-3 years.  Bone density scan. This is done to screen for osteoporosis. You may have this done starting at age 61.  Mammogram. This may be done every 1-2 years. Talk to your health care provider about how often you should have regular mammograms. Talk with your health care provider about your test results, treatment options, and if necessary, the need for more tests. Vaccines  Your health care provider may recommend certain vaccines, such as:  Influenza vaccine. This is recommended every year.  Tetanus, diphtheria, and acellular pertussis (Tdap, Td) vaccine. You may need a Td booster every 10 years.  Zoster vaccine. You may need this after age 53.  Pneumococcal 13-valent conjugate (PCV13) vaccine. One dose  is recommended after age 28.  Pneumococcal polysaccharide (PPSV23) vaccine. One dose is recommended after age 72. Talk to your health care provider about which screenings and vaccines you need and how often you need them. This information is not intended to replace advice given to you by your health care provider. Make sure you discuss any questions you have with your health care provider. Document Released: 01/24/2015 Document Revised: 09/17/2015 Document Reviewed: 10/29/2014 Elsevier Interactive Patient Education  2017 [place] Prevention in the Home Falls can cause injuries. They can happen to people of all ages. There are many things you can do to make your home safe and to help prevent falls. What can I do on the outside of my home?  Regularly fix the edges of walkways and driveways and fix any cracks.  Remove anything that might make you trip as you walk through a door, such as a raised step or threshold.  Trim any bushes or trees on the path to your home.  Use bright outdoor lighting.  Clear any walking paths of anything that might make someone trip, such as rocks or tools.  Regularly check to see if handrails are loose or broken. Make sure that both sides of any steps have handrails.  Any raised decks and porches should have guardrails on the edges.  Have any leaves, snow, or ice cleared regularly.  Use sand or salt on walking paths during winter.  Clean up any spills in your garage right away. This includes oil or grease spills. What can I do in the bathroom?  Use night lights.  Install grab bars by the toilet and in the tub and shower. Do not use towel bars as grab bars.  Use non-skid mats or decals in the tub or shower.  If you need to sit down in the shower, use a plastic, non-slip stool.  Keep the floor dry. Clean up any water that spills on the floor as soon as it happens.  Remove soap buildup in the tub or shower regularly.  Attach bath mats  securely with double-sided non-slip rug tape.  Do not have throw rugs and other things on the floor that can make you trip. What can I do in the bedroom?  Use night lights.  Make sure that you have a light by your bed that is easy to reach.  Do not use any sheets or blankets that are too big for your bed. They should not hang down onto the floor.  Have a firm chair that has side arms. You can use this for support while you get dressed.  Do not have throw rugs and other things on the floor that can make you trip. What can I do in the kitchen?  Clean up any spills right away.  Avoid walking on wet floors.  Keep items that you use a lot in easy-to-reach places.  If you need to reach something above you, use a strong step stool that has a grab bar.  Keep electrical cords out of the way.  Do not use floor polish or wax that makes floors slippery. If you must use wax, use non-skid floor wax.  Do not have throw rugs and other things on the floor that can make you trip. What can I do  with my stairs?  Do not leave any items on the stairs.  Make sure that there are handrails on both sides of the stairs and use them. Fix handrails that are broken or loose. Make sure that handrails are as long as the stairways.  Check any carpeting to make sure that it is firmly attached to the stairs. Fix any carpet that is loose or worn.  Avoid having throw rugs at the top or bottom of the stairs. If you do have throw rugs, attach them to the floor with carpet tape.  Make sure that you have a light switch at the top of the stairs and the bottom of the stairs. If you do not have them, ask someone to add them for you. What else can I do to help prevent falls?  Wear shoes that:  Do not have high heels.  Have rubber bottoms.  Are comfortable and fit you well.  Are closed at the toe. Do not wear sandals.  If you use a stepladder:  Make sure that it is fully opened. Do not climb a closed  stepladder.  Make sure that both sides of the stepladder are locked into place.  Ask someone to hold it for you, if possible.  Clearly mark and make sure that you can see:  Any grab bars or handrails.  First and last steps.  Where the edge of each step is.  Use tools that help you move around (mobility aids) if they are needed. These include:  Canes.  Walkers.  Scooters.  Crutches.  Turn on the lights when you go into a dark area. Replace any light bulbs as soon as they burn out.  Set up your furniture so you have a clear path. Avoid moving your furniture around.  If any of your floors are uneven, fix them.  If there are any pets around you, be aware of where they are.  Review your medicines with your doctor. Some medicines can make you feel dizzy. This can increase your chance of falling. Ask your doctor what other things that you can do to help prevent falls. This information is not intended to replace advice given to you by your health care provider. Make sure you discuss any questions you have with your health care provider. Document Released: 10/24/2008 Document Revised: 06/05/2015 Document Reviewed: 02/01/2014 Elsevier Interactive Patient Education  2017 [employer].

## 2019-06-21 NOTE — Progress Notes (Signed)
This visit occurred during the SARS-CoV-2 public health emergency.  Safety protocols were in place, including screening questions prior to the visit, additional usage of staff PPE, and extensive cleaning of exam room while observing appropriate contact time as indicated for disinfecting solutions.  Subjective:   Vickie Brown is a 79 y.o. female who presents for Medicare Annual (Subsequent) preventive examination.  Review of Systems:  n/a Cardiac Risk Factors include: advanced age (>32men, >40 women);diabetes mellitus;hypertension;sedentary lifestyle     Objective:     Vitals: BP 126/74 (BP Location: Left Arm, Patient Position: Sitting, Cuff Size: Normal)   Pulse 68   Temp 98.5 F (36.9 C) (Oral)   Ht 4' 11.8" (1.519 m)   Wt 151 lb (68.5 kg)   BMI 29.69 kg/m   Body mass index is 29.69 kg/m.  Advanced Directives 06/21/2019 09/28/2018 07/25/2016  Does Patient Have a Medical Advance Directive? No No No  Would patient like information on creating a medical advance directive? No - Patient declined - No - Patient declined    Tobacco Social History   Tobacco Use  Smoking Status Never Smoker  Smokeless Tobacco Never Used     Counseling given: Not Answered   Clinical Intake:  Pre-visit preparation completed: Yes  Pain : No/denies pain     Nutritional Status: BMI 25 -29 Overweight Nutritional Risks: None Diabetes: Yes  How often do you need to have someone help you when you read instructions, pamphlets, or other written materials from your doctor or pharmacy?: 1 - Never What is the last grade level you completed in school?: some college  Interpreter Needed?: No  Information entered by :: NAllen LPN  Past Medical History:  Diagnosis Date  . Chronic headaches   . Diverticulitis   . Hyperlipidemia   . Knee pain   . Type 2 diabetes mellitus (HCC)    Past Surgical History:  Procedure Laterality Date  . Bilateral tubal ligation  1971  . BTL    . PARTIAL  HYSTERECTOMY  1973  . TONSILECTOMY, ADENOIDECTOMY, BILATERAL MYRINGOTOMY AND TUBES  1950  . VESICOVAGINAL FISTULA CLOSURE W/ TAH     Family History  Problem Relation Age of Onset  . Cervical cancer Mother   . Cancer Mother   . Diabetes Father   . Diabetes Sister   . Diabetes Brother   . Breast cancer Neg Hx    Social History   Socioeconomic History  . Marital status: Divorced    Spouse name: Not on file  . Number of children: Not on file  . Years of education: Not on file  . Highest education level: Not on file  Occupational History  . Occupation: retired  Tobacco Use  . Smoking status: Never Smoker  . Smokeless tobacco: Never Used  Vaping Use  . Vaping Use: Never used  Substance and Sexual Activity  . Alcohol use: Not Currently    Comment: occassionally  . Drug use: No  . Sexual activity: Not Currently    Birth control/protection: Surgical  Other Topics Concern  . Not on file  Social History Narrative   Lives alone.   Social Determinants of Health   Financial Resource Strain: Low Risk   . Difficulty of Paying Living Expenses: Not hard at all  Food Insecurity: No Food Insecurity  . Worried About Programme researcher, broadcasting/film/video in the Last Year: Never true  . Ran Out of Food in the Last Year: Never true  Transportation Needs: No Transportation Needs  .  Lack of Transportation (Medical): No  . Lack of Transportation (Non-Medical): No  Physical Activity: Inactive  . Days of Exercise per Week: 0 days  . Minutes of Exercise per Session: 0 min  Stress: No Stress Concern Present  . Feeling of Stress : Not at all  Social Connections:   . Frequency of Communication with Friends and Family:   . Frequency of Social Gatherings with Friends and Family:   . Attends Religious Services:   . Active Member of Clubs or Organizations:   . Attends Banker Meetings:   Marland Kitchen Marital Status:     Outpatient Encounter Medications as of 06/21/2019  Medication Sig  . aspirin 81 MG  tablet Take 81 mg by mouth every evening.   . benzonatate (TESSALON PERLES) 100 MG capsule Take 1 capsule (100 mg total) by mouth 3 (three) times daily as needed for cough.  . Cholecalciferol (VITAMIN D3) 2000 UNITS TABS Take 1 tablet by mouth daily.    . diclofenac sodium (VOLTAREN) 1 % GEL APPLY 2 GRAMS TOPICALLY TO KNEES FOUR TIMES DAILY AS NEEDED  . esomeprazole (NEXIUM) 40 MG capsule Take 40 mg by mouth daily before breakfast.    . fluticasone (FLONASE) 50 MCG/ACT nasal spray Place 1 spray into both nostrils at bedtime.  . Homeopathic Products (OSCILLOCOCCINUM PO) Take by mouth.  Marland Kitchen JANUVIA 100 MG tablet TAKE 1 TABLET(100 MG) BY MOUTH DAILY  . losartan (COZAAR) 50 MG tablet Take 1 tablet (50 mg total) by mouth daily.  . Multiple Vitamins-Minerals (ALIVE ONCE DAILY WOMENS 50+ PO) Take 1 tablet by mouth daily.  . Omega-3 Fatty Acids (FISH OIL PO) Take by mouth.  . pravastatin (PRAVACHOL) 40 MG tablet TAKE 1 TABLET BY MOUTH DAILY  . Probiotic Product (ALIGN) 4 MG CAPS Take 4 mg by mouth daily.  . TURMERIC PO Take by mouth.   No facility-administered encounter medications on file as of 06/21/2019.    Activities of Daily Living In your present state of health, do you have any difficulty performing the following activities: 06/21/2019 09/28/2018  Hearing? N N  Vision? N Y  Comment - sometimes  Difficulty concentrating or making decisions? Y Y  Comment names and some forgetfulness -  Walking or climbing stairs? Y Y  Comment due to knees due to knees  Dressing or bathing? N N  Doing errands, shopping? N N  Preparing Food and eating ? N N  Using the Toilet? N N  In the past six months, have you accidently leaked urine? Y N  Comment wears a pad -  Do you have problems with loss of bowel control? Y N  Managing your Medications? N N  Managing your Finances? N N  Housekeeping or managing your Housekeeping? N N  Some recent data might be hidden    Patient Care Team: Dorothyann Peng, MD as  PCP - General (Internal Medicine)    Assessment:   This is a routine wellness examination for Vickie Brown.  Exercise Activities and Dietary recommendations Current Exercise Habits: The patient does not participate in regular exercise at present  Goals    . Patient Stated     09/28/2018, no goals    . Patient Stated     06/21/2019, stay alive       Fall Risk Fall Risk  06/21/2019 10/12/2018 09/28/2018 04/24/2018 01/26/2018  Falls in the past year? 0 1 0 1 0  Number falls in past yr: - 0 - 0 -  Injury with Fall? - - -  0 -  Risk for fall due to : Medication side effect - Medication side effect - -  Follow up Falls evaluation completed;Education provided;Falls prevention discussed - Falls prevention discussed;Education provided;Falls evaluation completed - -   Is the patient's home free of loose throw rugs in walkways, pet beds, electrical cords, etc?   yes      Grab bars in the bathroom? yes      Handrails on the stairs?   yes      Adequate lighting?   yes  Timed Get Up and Go performed: n/a  Depression Screen PHQ 2/9 Scores 06/21/2019 09/28/2018 04/24/2018 01/26/2018  PHQ - 2 Score 0 1 0 0  PHQ- 9 Score 0 7 - -     Cognitive Function     6CIT Screen 06/21/2019 09/28/2018  What Year? 0 points 0 points  What month? 0 points 0 points  What time? 3 points 0 points  Count back from 20 0 points 0 points  Months in reverse 0 points 0 points  Repeat phrase 0 points 0 points  Total Score 3 0    Immunization History  Administered Date(s) Administered  . DTaP 10/28/2015  . Fluad Quad(high Dose 65+) 10/19/2018  . Influenza Whole 10/11/2009  . Influenza, High Dose Seasonal PF 10/21/2017  . Influenza-Unspecified 10/19/2018  . PFIZER SARS-COV-2 Vaccination 02/19/2019, 03/16/2019  . Pneumococcal-Unspecified 03/28/2013, 10/28/2015    Qualifies for Shingles Vaccine? yes  Screening Tests Health Maintenance  Topic Date Due  . DEXA SCAN  Never done  . INFLUENZA VACCINE  08/12/2019  .  HEMOGLOBIN A1C  08/14/2019  . FOOT EXAM  10/12/2019  . OPHTHALMOLOGY EXAM  06/19/2020  . TETANUS/TDAP  10/27/2025  . COVID-19 Vaccine  Completed  . PNA vac Low Risk Adult  Completed    Cancer Screenings: Lung: Low Dose CT Chest recommended if Age 47-80 years, 30 pack-year currently smoking OR have quit w/in 15years. Patient does not qualify. Breast:  Up to date on Mammogram? Yes   Up to date of Bone Density/Dexa? No Colorectal: up to date  Additional Screenings: : Hepatitis C Screening: n/a     Plan:    Patient just wants to stay alive.   I have personally reviewed and noted the following in the patient's chart:   . Medical and social history . Use of alcohol, tobacco or illicit drugs  . Current medications and supplements . Functional ability and status . Nutritional status . Physical activity . Advanced directives . List of other physicians . Hospitalizations, surgeries, and ER visits in previous 12 months . Vitals . Screenings to include cognitive, depression, and falls . Referrals and appointments  In addition, I have reviewed and discussed with patient certain preventive protocols, quality metrics, and best practice recommendations. A written personalized care plan for preventive services as well as general preventive health recommendations were provided to patient.     Kellie Simmering, LPN  1/77/9390

## 2019-06-21 NOTE — Patient Instructions (Signed)
Exercising to Stay Healthy To become healthy and stay healthy, it is recommended that you do moderate-intensity and vigorous-intensity exercise. You can tell that you are exercising at a moderate intensity if your heart starts beating faster and you start breathing faster but can still hold a conversation. You can tell that you are exercising at a vigorous intensity if you are breathing much harder and faster and cannot hold a conversation while exercising. Exercising regularly is important. It has many health benefits, such as:  Improving overall fitness, flexibility, and endurance.  Increasing bone density.  Helping with weight control.  Decreasing body fat.  Increasing muscle strength.  Reducing stress and tension.  Improving overall health. How often should I exercise? Choose an activity that you enjoy, and set realistic goals. Your health care provider can help you make an activity plan that works for you. Exercise regularly as told by your health care provider. This may include:  Doing strength training two times a week, such as: ? Lifting weights. ? Using resistance bands. ? Push-ups. ? Sit-ups. ? Yoga.  Doing a certain intensity of exercise for a given amount of time. Choose from these options: ? A total of 150 minutes of moderate-intensity exercise every week. ? A total of 75 minutes of vigorous-intensity exercise every week. ? A mix of moderate-intensity and vigorous-intensity exercise every week. Children, pregnant women, people who have not exercised regularly, people who are overweight, and older adults may need to talk with a health care provider about what activities are safe to do. If you have a medical condition, be sure to talk with your health care provider before you start a new exercise program. What are some exercise ideas? Moderate-intensity exercise ideas include:  Walking 1 mile (1.6 km) in about 15  minutes.  Biking.  Hiking.  Golfing.  Dancing.  Water aerobics. Vigorous-intensity exercise ideas include:  Walking 4.5 miles (7.2 km) or more in about 1 hour.  Jogging or running 5 miles (8 km) in about 1 hour.  Biking 10 miles (16.1 km) or more in about 1 hour.  Lap swimming.  Roller-skating or in-line skating.  Cross-country skiing.  Vigorous competitive sports, such as football, basketball, and soccer.  Jumping rope.  Aerobic dancing. What are some everyday activities that can help me to get exercise?  Yard work, such as: ? Pushing a lawn mower. ? Raking and bagging leaves.  Washing your car.  Pushing a stroller.  Shoveling snow.  Gardening.  Washing windows or floors. How can I be more active in my day-to-day activities?  Use stairs instead of an elevator.  Take a walk during your lunch break.  If you drive, park your car farther away from your work or school.  If you take public transportation, get off one stop early and walk the rest of the way.  Stand up or walk around during all of your indoor phone calls.  Get up, stretch, and walk around every 30 minutes throughout the day.  Enjoy exercise with a friend. Support to continue exercising will help you keep a regular routine of activity. What guidelines can I follow while exercising?  Before you start a new exercise program, talk with your health care provider.  Do not exercise so much that you hurt yourself, feel dizzy, or get very short of breath.  Wear comfortable clothes and wear shoes with good support.  Drink plenty of water while you exercise to prevent dehydration or heat stroke.  Work out until your breathing   and your heartbeat get faster. Where to find more information  [place] Department of Health and Human Services: www.hhs.gov  Centers for Disease Control and Prevention (CDC): www.cdc.gov Summary  Exercising regularly is important. It will improve your overall fitness,  flexibility, and endurance.  Regular exercise also will improve your overall health. It can help you control your weight, reduce stress, and improve your bone density.  Do not exercise so much that you hurt yourself, feel dizzy, or get very short of breath.  Before you start a new exercise program, talk with your health care provider. This information is not intended to replace advice given to you by your health care provider. Make sure you discuss any questions you have with your health care provider. Document Revised: 12/10/2016 Document Reviewed: 11/18/2016 Elsevier Patient Education  2020 Elsevier Inc.  

## 2019-06-22 LAB — BMP8+EGFR
BUN/Creatinine Ratio: 7 — ABNORMAL LOW (ref 12–28)
BUN: 7 mg/dL — ABNORMAL LOW (ref 8–27)
CO2: 26 mmol/L (ref 20–29)
Calcium: 9.7 mg/dL (ref 8.7–10.3)
Chloride: 98 mmol/L (ref 96–106)
Creatinine, Ser: 0.95 mg/dL (ref 0.57–1.00)
GFR calc Af Amer: 65 mL/min/{1.73_m2} (ref >59)
GFR calc non Af Amer: 57 mL/min/{1.73_m2} — ABNORMAL LOW (ref >59)
Glucose: 114 mg/dL — ABNORMAL HIGH (ref 65–99)
Potassium: 4.7 mmol/L (ref 3.5–5.2)
Sodium: 140 mmol/L (ref 134–144)

## 2019-06-22 LAB — HEMOGLOBIN A1C
Est. average glucose Bld gHb Est-mCnc: 134 mg/dL
Hgb A1c MFr Bld: 6.3 % — ABNORMAL HIGH (ref 4.8–5.6)

## 2019-06-28 ENCOUNTER — Encounter: Payer: Self-pay | Admitting: Internal Medicine

## 2019-06-29 ENCOUNTER — Telehealth: Payer: Self-pay | Admitting: Internal Medicine

## 2019-06-29 NOTE — Chronic Care Management (AMB) (Signed)
  Chronic Care Management   Note  06/29/2019 Name: Vickie Brown MRN: [id number] DOB: Jan 10, 1941  Vickie Brown is a 79 y.o. year old female who is a primary care patient of Glendale Chard, MD. I reached out to Tim Lair by phone today in response to a referral sent by Vickie Brown's health plan.     Vickie Brown was given information about Chronic Care Management services today including:  1. CCM service includes personalized support from designated clinical staff supervised by her physician, including individualized plan of care and coordination with other care providers 2. 24/7 contact phone numbers for assistance for urgent and routine care needs. 3. Service will only be billed when office clinical staff spend 20 minutes or more in a month to coordinate care. 4. Only one practitioner may furnish and bill the service in a calendar month. 5. The patient may stop CCM services at any time (effective at the end of the month) by phone call to the office staff. 6. The patient will be responsible for cost sharing (co-pay) of up to 20% of the service fee (after annual deductible is met).  Patient agreed to services and verbal consent obtained.   Follow up plan: Face to Face appointment with care management team member scheduled for: 07/26/2019  [place], [place] [address] Direct Dial: [email or web address] Website: [email or web address]

## 2019-07-10 ENCOUNTER — Other Ambulatory Visit: Payer: Self-pay | Admitting: Internal Medicine

## 2019-07-23 ENCOUNTER — Other Ambulatory Visit: Payer: Self-pay | Admitting: Internal Medicine

## 2019-07-25 NOTE — Chronic Care Management (AMB) (Deleted)
Chronic Care Management Pharmacy  Name: FRANCY MCILVAINE  MRN: [id number] DOB: 01-24-1940  Chief Complaint/ HPI  Vickie Brown,  79 y.o. , female presents for their Initial CCM visit with the clinical pharmacist {CHL HP Upstream Pharm visit ENID:[id number]}.  PCP : Glendale Chard, MD  Their chronic conditions include: DM, CKD stage 2, HLD, knee pain, myalgia, hypertensive nephropathy.   Office Visits:*** 06/21/2019 - AWV and Chronic visit -  Ordered BMP, a1C, bone density test. Continue current medications.  03/16/2019 - COVID vaccine 02/19/2019 - COVID vaccine 02/14/2019 - Ordered CMP14+EGFR, Lipid panel, A1c. Patient complains of tension headaches - Md advised magnesium nightly and rub Vicks vaporub on her temples.  COVID test ordered due to tinnitus complaint. COVID test negative.  Consult Visit:*** 06/28/2019 - Optometry - Retinopathy reported.  01/30/2019 - Podiatry - nail fungus. Prescribed compounded nail lacquer from [employer]. Recommended OTC biotin. Follow-up in 6 months if not better.  Medications: Outpatient Encounter Medications as of 07/26/2019  Medication Sig  . aspirin 81 MG tablet Take 81 mg by mouth every evening.   . benzonatate (TESSALON PERLES) 100 MG capsule Take 1 capsule (100 mg total) by mouth 3 (three) times daily as needed for cough.  . Cholecalciferol (VITAMIN D3) 2000 UNITS TABS Take 1 tablet by mouth daily.    . diclofenac sodium (VOLTAREN) 1 % GEL APPLY 2 GRAMS TOPICALLY TO KNEES FOUR TIMES DAILY AS NEEDED  . esomeprazole (NEXIUM) 40 MG capsule Take 40 mg by mouth daily before breakfast.    . fluticasone (FLONASE) 50 MCG/ACT nasal spray Place 1 spray into both nostrils at bedtime.  . Homeopathic Products (OSCILLOCOCCINUM PO) Take by mouth.  Marland Kitchen JANUVIA 100 MG tablet TAKE 1 TABLET(100 MG) BY MOUTH DAILY  . losartan (COZAAR) 50 MG tablet TAKE 1 TABLET(50 MG) BY MOUTH DAILY  . Multiple Vitamins-Minerals (ALIVE ONCE DAILY WOMENS 50+ PO) Take 1  tablet by mouth daily.  . Omega-3 Fatty Acids (FISH OIL PO) Take by mouth.  . pravastatin (PRAVACHOL) 40 MG tablet TAKE 1 TABLET BY MOUTH DAILY  . Probiotic Product (ALIGN) 4 MG CAPS Take 4 mg by mouth daily.  . TURMERIC PO Take by mouth.   No facility-administered encounter medications on file as of 07/26/2019.   No Known Allergies  SDOH Screenings   Alcohol Screen:   . Last Alcohol Screening Score (AUDIT):   Depression (PHQ2-9): Low Risk   . PHQ-2 Score: 0  Financial Resource Strain: Low Risk   . Difficulty of Paying Living Expenses: Not hard at all  Food Insecurity: No Food Insecurity  . Worried About Charity fundraiser in the Last Year: Never true  . Ran Out of Food in the Last Year: Never true  Housing:   . Last Housing Risk Score:   Physical Activity: Inactive  . Days of Exercise per Week: 0 days  . Minutes of Exercise per Session: 0 min  Social Connections:   . Frequency of Communication with Friends and Family:   . Frequency of Social Gatherings with Friends and Family:   . Attends Religious Services:   . Active Member of Clubs or Organizations:   . Attends Archivist Meetings:   Marland Kitchen Marital Status:   Stress: No Stress Concern Present  . Feeling of Stress : Not at all  Tobacco Use: Low Risk   . Smoking Tobacco Use: Never Smoker  . Smokeless Tobacco Use: Never Used  Transportation Needs: No Transportation Needs  . Lack  of Transportation (Medical): No  . Lack of Transportation (Non-Medical): No     Current Diagnosis/Assessment:  Goals Addressed   None    Hyperlipidemia   LDL goal < ***  Lipid Panel     Component Value Date/Time   CHOL 143 02/14/2019 1613   TRIG 90 02/14/2019 1613   HDL 71 02/14/2019 1613   LDLCALC 55 02/14/2019 1613    Hepatic Function Latest Ref Rng & Units 02/14/2019 10/12/2018 04/24/2018  Total Protein 6.0 - 8.5 g/dL 6.6 6.8 6.6  Albumin 3.7 - 4.7 g/dL 4.2 4.4 4.3  AST 0 - 40 IU/L _0 ALT 0 - 32 IU/L _1 Alk  Phosphatase 39 - 117 IU/L 54 66 54  Total Bilirubin 0.0 - 1.2 mg/dL 0.2 0.2 0.3     The ASCVD Risk score ([place]., et al., 2013) failed to calculate for the following reasons:   The 2013 ASCVD risk score is only valid for ages 71 to 63   Patient has failed these meds in past: n/a Patient is currently {CHL Controlled/[phone]} on the following medications:  . Aspirin 81 mg daily . Pravastatin 40 mg daily . Omega-3 Fatty Acids ***   We discussed:  {CHL HP Upstream Pharmacy discussion:[phone]}  Plan  Continue {CHL HP Upstream Pharmacy Plans:[id number]}   Diabetes   Recent Relevant Labs: Lab Results  Component Value Date/Time   HGBA1C 6.3 (H) 06/21/2019 12:24 PM   HGBA1C 6.1 (H) 02/14/2019 04:13 PM   MICROALBUR 10 06/21/2019 12:56 PM     Checking BG: {CHL HP Blood Glucose Monitoring Frequency:[id number]}  Recent FBG Readings: Recent pre-meal BG readings: *** Recent 2hr PP BG readings:  *** Recent HS BG readings: *** Patient has failed these meds in past: pioglitazone Patient is currently {CHL Controlled/[phone]} on the following medications:   Januvia 100 mg daily  Last diabetic Foot exam:  Lab Results  Component Value Date/Time   HMDIABEYEEXA Retinopathy (A) 06/20/2019 12:00 AM    Last diabetic Eye exam: No results found for: HMDIABFOOTEX   We discussed: {CHL HP Upstream Pharmacy discussion:[phone]}  Plan  Continue {CHL HP Upstream Pharmacy Plans:4323853488},  Hypertensive Nephropathy   BP today is:  {CHL HP UPSTREAM Pharmacist BP ranges:[phone]}  Office blood pressures are  BP Readings from Last 3 Encounters:  06/21/19 126/74  06/21/19 126/74  02/14/19 (!) 105/54    Patient has failed these meds in the past: hydrochlorothiazide  Patient is currently controlled/uncontrolled*** on the following medications:   Losartan 50 mg daily   Patient checks BP at home {CHL HP BP Monitoring  Frequency:[phone]}  Patient home BP readings are ranging: ***  We discussed {CHL HP Upstream Pharmacy discussion:[phone]}  Plan  Continue {CHL HP Upstream Pharmacy Plans:4323853488}     and  Other Diagnosis:Knee Pain    Patient has failed these meds in past: n/a Patient is currently {CHL Controlled/[phone]} on the following medications:   diclofenac 1% gel 2 grams to knees four times daily prn  We discussed:  {CHL HP Upstream Pharmacy discussion:[phone]}  Plan  Continue {CHL HP Upstream Pharmacy Plans:4323853488}  Osteopenia / Osteoporosis   Last DEXA Scan: *** 02/11/2004 last date listed in chart  T-Score femoral neck: ***  T-Score total hip: ***  T-Score lumbar spine: ***  T-Score forearm radius: ***  10-year probability of major osteoporotic fracture: ***  10-year probability of hip fracture: ***  No results found for: VD25OH   Patient {is;is not an osteoporosis candidate:23886}  Patient  has failed these meds in past: alendronate, caltrate with D Patient is currently {CHL Controlled/Uncontrolled:[phone]} on the following medications:  Marland Kitchen Vitamin D3 2000 units daily  We discussed:  {Osteoporosis Counseling:23892}  Plan  Continue {CHL HP Upstream Pharmacy Plans:[phone]}  Health Maintenance   Patient is currently {CHL Controlled/Uncontrolled:[phone]} on the following medications:  . Turmeric *** - *** . Align 4 mg daily - *** . Esomeprazole 40 mg daily before breakfast - *** . Oscillococcinum *** - *** . Multivitamin daily - *** . Benzonotate 100 mg tid prn cough . Fluticasone nasal spray 1 spray both nostrils at bedtime - ***  We discussed:  ***  Plan  Continue {CHL HP Upstream Pharmacy ELTRV:[id number]}  Vaccines   Reviewed and discussed patient's vaccination history.    Immunization History  Administered Date(s) Administered  . DTaP 10/28/2015  . Fluad Quad(high Dose 65+) 10/19/2018  . Influenza Whole  10/11/2009  . Influenza, High Dose Seasonal PF 10/21/2017  . Influenza-Unspecified 10/19/2018  . PFIZER SARS-COV-2 Vaccination 02/19/2019, 03/16/2019  . Pneumococcal-Unspecified 03/28/2013, 10/28/2015    Plan  Recommended patient receive *** vaccine in *** office.   Medication Management   Pt uses *** pharmacy for all medications Uses pill box? {Yes or If no, why not?:20788} Pt endorses ***% compliance  We discussed: ***  Plan  {US Pharmacy SHUO:37290}    Follow up: *** month phone visit  ***

## 2019-07-26 ENCOUNTER — Other Ambulatory Visit: Payer: Self-pay

## 2019-07-26 ENCOUNTER — Ambulatory Visit: Payer: Medicare Other

## 2019-07-26 DIAGNOSIS — E1122 Type 2 diabetes mellitus with diabetic chronic kidney disease: Secondary | ICD-10-CM

## 2019-07-26 DIAGNOSIS — E7800 Pure hypercholesterolemia, unspecified: Secondary | ICD-10-CM

## 2019-07-26 DIAGNOSIS — I129 Hypertensive chronic kidney disease with stage 1 through stage 4 chronic kidney disease, or unspecified chronic kidney disease: Secondary | ICD-10-CM
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
## 2019-07-31 DIAGNOSIS — E119 Type 2 diabetes mellitus without complications: Secondary | ICD-10-CM | POA: Diagnosis not present

## 2019-07-31 DIAGNOSIS — E1165 Type 2 diabetes mellitus with hyperglycemia: Secondary | ICD-10-CM | POA: Diagnosis not present

## 2019-08-17 NOTE — Patient Instructions (Signed)
Visit Information  Goals Addressed            This Visit's Progress    Pharmacy Care Plan       CARE PLAN ENTRY (see longitudinal plan of care for additional care plan information)  Current Barriers:   Chronic Disease Management support, education, and care coordination needs related to Hypertension, Hyperlipidemia, and Diabetes   Hypertension BP Readings from Last 3 Encounters:  06/21/19 126/74  06/21/19 126/74  02/14/19 (!) 105/54    Pharmacist Clinical Goal(s): o Over the next 180 days, patient will work with PharmD and providers to maintain BP goal <130/80  Current regimen:  o Losartan 50mg  daily  Interventions: o Provided dietary and exercise recommendations  Patient self care activities - Over the next 180 days, patient will: o Check BP if symptomatic, document, and provide at future appointments o Ensure daily salt intake < 2300 mg/day  Hyperlipidemia Lab Results  Component Value Date/Time   LDLCALC 55 02/14/2019 04:13 PM    Pharmacist Clinical Goal(s): o Over the next 180 days, patient will work with PharmD and providers to maintain LDL goal < 70  Current regimen:  o Pravastatin 40mg  daily  Interventions: o Provided dietary and exercise recommendations  Patient self care activities - Over the next 180 days, patient will: o Exercise for 30 minutes daily 5 times per week (150 minutes per week total) o Eat baked lean meats (chicken, fish)  Diabetes Lab Results  Component Value Date/Time   HGBA1C 6.3 (H) 06/21/2019 12:24 PM   HGBA1C 6.1 (H) 02/14/2019 04:13 PM    Pharmacist Clinical Goal(s): o Over the next 180 days, patient will work with PharmD and providers to maintain A1c goal <7%  Current regimen:  o Januvia 100mg  daily  Interventions: o Provided dietary and exercise recommendations o Discussed appropriate goals for fasting blood sugar (80-130) o Reviewed cost of Januvia. Affordable for patient.  Patient self care activities - Over  the next 180 days, patient will: o Check blood sugar once daily, document, and provide at future appointments o Contact provider with any episodes of hypoglycemia  Health Maintenance  Pharmacist Clinical Goal(s) o Over the next 180 days, patient will work with PharmD and providers to optimize over the counter supplements  Current regimen:   Turmeric daily  Align 4 mg daily   Oscillococcinum daily   Alive multivitamin daily   Fluticasone nasal spray 1 spray both nostrils at bedtime   Biotin daily  Cetirizine 10mg  daily  Calcium daily  Cyanocobalamin 500mcg daily  Vitamin C 500mg  daily  EmergenC 1000mg  daily   Prevagen daily  Tylenol 500mg   Interventions: o Advised patient to only take 1 vitamin C supplement daily (she does not need both Vitamin C and EmergenC daily) o Advised patient to take 1200mg  of calcium daily o Will review coupon/cost assistance for Prevagen  Patient self care activities - Over the next 180 days, patient will: o Take Vitamin C 500mg  daily and take EmergenC when she feels like she may be getting sick o Take 1200mg  of calcium daily  Medication management  Pharmacist Clinical Goal(s): o Over the next 180 days, patient will work with PharmD and providers to maintain optimal medication adherence  Current pharmacy: [facility]  Interventions o Comprehensive medication review performed. o Continue current medication management strategy  Patient self care activities - Over the next 180 days, patient will: o Focus on medication adherence by using a pill box to organize medications o Take medications as prescribed o  Report any questions or concerns to PharmD and/or provider(s)  Initial goal documentation        Vickie Brown was given information about Chronic Care Management services today including:  1. CCM service includes personalized support from designated clinical staff supervised by her physician, including individualized plan of care  and coordination with other care providers 2. 24/7 contact phone numbers for assistance for urgent and routine care needs. 3. Standard insurance, coinsurance, copays and deductibles apply for chronic care management only during months in which we provide at least 20 minutes of these services. Most insurances cover these services at 100%, however patients may be responsible for any copay, coinsurance and/or deductible if applicable. This service may help you avoid the need for more expensive face-to-face services. 4. Only one practitioner may furnish and bill the service in a calendar month. 5. The patient may stop CCM services at any time (effective at the end of the month) by phone call to the office staff.  Patient agreed to services and verbal consent obtained.   The patient verbalized understanding of instructions provided today and agreed to receive a mailed copy of patient instruction and/or educational materials. Telephone follow up appointment with pharmacy team member scheduled for: 10/31/19 @ 3:30 PM  Beryle Flock, PharmD Clinical Pharmacist [facility] [phone]   Diabetes Mellitus and Nutrition, Adult When you have diabetes (diabetes mellitus), it is very important to have healthy eating habits because your blood sugar (glucose) levels are greatly affected by what you eat and drink. Eating healthy foods in the appropriate amounts, at about the same times every day, can help you:  Control your blood glucose.  Lower your risk of heart disease.  Improve your blood pressure.  Reach or maintain a healthy weight. Every person with diabetes is different, and each person has different needs for a meal plan. Your health care provider may recommend that you work with a diet and nutrition specialist (dietitian) to make a meal plan that is best for you. Your meal plan may vary depending on factors such as:  The calories you need.  The medicines you  take.  Your weight.  Your blood glucose, blood pressure, and cholesterol levels.  Your activity level.  Other health conditions you have, such as heart or kidney disease. How do carbohydrates affect me? Carbohydrates, also called carbs, affect your blood glucose level more than any other type of food. Eating carbs naturally raises the amount of glucose in your blood. Carb counting is a method for keeping track of how many carbs you eat. Counting carbs is important to keep your blood glucose at a healthy level, especially if you use insulin or take certain oral diabetes medicines. It is important to know how many carbs you can safely have in each meal. This is different for every person. Your dietitian can help you calculate how many carbs you should have at each meal and for each snack. Foods that contain carbs include:  Bread, cereal, rice, pasta, and crackers.  Potatoes and corn.  Peas, beans, and lentils.  Milk and yogurt.  Fruit and juice.  Desserts, such as cakes, cookies, ice cream, and candy. How does alcohol affect me? Alcohol can cause a sudden decrease in blood glucose (hypoglycemia), especially if you use insulin or take certain oral diabetes medicines. Hypoglycemia can be a life-threatening condition. Symptoms of hypoglycemia (sleepiness, dizziness, and confusion) are similar to symptoms of having too much alcohol. If your health care provider says that alcohol  is safe for you, follow these guidelines:  Limit alcohol intake to no more than 1 drink per day for nonpregnant women and 2 drinks per day for men. One drink equals 12 oz of beer, 5 oz of wine, or 1 oz of hard liquor.  Do not drink on an empty stomach.  Keep yourself hydrated with water, diet soda, or unsweetened iced tea.  Keep in mind that regular soda, juice, and other mixers may contain a lot of sugar and must be counted as carbs. What are tips for following this plan?  Reading food labels  Start by  checking the serving size on the "Nutrition Facts" label of packaged foods and drinks. The amount of calories, carbs, fats, and other nutrients listed on the label is based on one serving of the item. Many items contain more than one serving per package.  Check the total grams (g) of carbs in one serving. You can calculate the number of servings of carbs in one serving by dividing the total carbs by 15. For example, if a food has 30 g of total carbs, it would be equal to 2 servings of carbs.  Check the number of grams (g) of saturated and trans fats in one serving. Choose foods that have low or no amount of these fats.  Check the number of milligrams (mg) of salt (sodium) in one serving. Most people should limit total sodium intake to less than 2,300 mg per day.  Always check the nutrition information of foods labeled as "low-fat" or "nonfat". These foods may be higher in added sugar or refined carbs and should be avoided.  Talk to your dietitian to identify your daily goals for nutrients listed on the label. Shopping  Avoid buying canned, premade, or processed foods. These foods tend to be high in fat, sodium, and added sugar.  Shop around the outside edge of the grocery store. This includes fresh fruits and vegetables, bulk grains, fresh meats, and fresh dairy. Cooking  Use low-heat cooking methods, such as baking, instead of high-heat cooking methods like deep frying.  Cook using healthy oils, such as olive, canola, or sunflower oil.  Avoid cooking with butter, cream, or high-fat meats. Meal planning  Eat meals and snacks regularly, preferably at the same times every day. Avoid going long periods of time without eating.  Eat foods high in fiber, such as fresh fruits, vegetables, beans, and whole grains. Talk to your dietitian about how many servings of carbs you can eat at each meal.  Eat 4-6 ounces (oz) of lean protein each day, such as lean meat, chicken, fish, eggs, or tofu. One oz  of lean protein is equal to: ? 1 oz of meat, chicken, or fish. ? 1 egg. ?  cup of tofu.  Eat some foods each day that contain healthy fats, such as avocado, nuts, seeds, and fish. Lifestyle  Check your blood glucose regularly.  Exercise regularly as told by your health care provider. This may include: ? 150 minutes of moderate-intensity or vigorous-intensity exercise each week. This could be brisk walking, biking, or water aerobics. ? Stretching and doing strength exercises, such as yoga or weightlifting, at least 2 times a week.  Take medicines as told by your health care provider.  Do not use any products that contain nicotine or tobacco, such as cigarettes and e-cigarettes. If you need help quitting, ask your health care provider.  Work with a Veterinary surgeon or diabetes educator to identify strategies to manage stress and any  emotional and social challenges. Questions to ask a health care provider  Do I need to meet with a diabetes educator?  Do I need to meet with a dietitian?  What number can I call if I have questions?  When are the best times to check my blood glucose? Where to find more information:  American Diabetes Association: diabetes.org  Academy of Nutrition and Dietetics: www.eatright.[employer] of Diabetes and Digestive and Kidney Diseases (NIH): CarFlippers.tn Summary  A healthy meal plan will help you control your blood glucose and maintain a healthy lifestyle.  Working with a diet and nutrition specialist (dietitian) can help you make a meal plan that is best for you.  Keep in mind that carbohydrates (carbs) and alcohol have immediate effects on your blood glucose levels. It is important to count carbs and to use alcohol carefully. This information is not intended to replace advice given to you by your health care provider. Make sure you discuss any questions you have with your health care provider. Document Revised: 12/10/2016 Document  Reviewed: 02/02/2016 Elsevier Patient Education  2020 [employer].

## 2019-08-17 NOTE — Patient Instructions (Signed)
Visit Information ° °Goals Addressed   °  °  °  °  ° This Visit's Progress  ° • Pharmacy Care Plan     °  CARE PLAN ENTRY °(see longitudinal plan of care for additional care plan information) ° °Current Barriers:  °• Chronic Disease Management support, education, and care coordination needs related to Hypertension, Hyperlipidemia, and Diabetes °  °Hypertension °BP Readings from Last 3 Encounters:  °06/21/19 126/74  °06/21/19 126/74  °02/14/19 (!) 105/54  ° °• Pharmacist Clinical Goal(s): °o Over the next 180 days, patient will work with PharmD and providers to maintain BP goal <130/80 °• Current regimen:  °o Losartan 50mg daily °• Interventions: °o Provided dietary and exercise recommendations °• Patient self care activities - Over the next 180 days, patient will: °o Check BP if symptomatic, document, and provide at future appointments °o Ensure daily salt intake < 2300 mg/day ° °Hyperlipidemia °Lab Results  °Component Value Date/Time  ° LDLCALC 55 02/14/2019 04:13 PM  ° °• Pharmacist Clinical Goal(s): °o Over the next 180 days, patient will work with PharmD and providers to maintain LDL goal < 70 °• Current regimen:  °o Pravastatin 40mg daily °• Interventions: °o Provided dietary and exercise recommendations °• Patient self care activities - Over the next 180 days, patient will: °o Exercise for 30 minutes daily 5 times per week (150 minutes per week total) °o Eat baked lean meats (chicken, fish) ° °Diabetes °Lab Results  °Component Value Date/Time  ° HGBA1C 6.3 (H) 06/21/2019 12:24 PM  ° HGBA1C 6.1 (H) 02/14/2019 04:13 PM  ° °• Pharmacist Clinical Goal(s): °o Over the next 180 days, patient will work with PharmD and providers to maintain A1c goal <7% °• Current regimen:  °o Januvia 100mg daily °• Interventions: °o Provided dietary and exercise recommendations °o Discussed appropriate goals for fasting blood sugar (80-130) °o Reviewed cost of Januvia. Affordable for patient. °• Patient self care activities - Over  the next 180 days, patient will: °o Check blood sugar once daily, document, and provide at future appointments °o Contact provider with any episodes of hypoglycemia ° °Health Maintenance °• Pharmacist Clinical Goal(s) °o Over the next 180 days, patient will work with PharmD and providers to optimize over the counter supplements °• Current regimen:  °• Turmeric daily °• Align 4 mg daily  °• Oscillococcinum daily  °• Alive multivitamin daily  °• Fluticasone nasal spray 1 spray both nostrils at bedtime  °• Biotin daily °• Cetirizine 10mg daily °• Calcium daily °• Cyanocobalamin 500mcg daily °• Vitamin C 500mg daily °• EmergenC 1000mg daily  °• Prevagen daily °• Tylenol 500mg °• Interventions: °o Advised patient to only take 1 vitamin C supplement daily (she does not need both Vitamin C and EmergenC daily) °o Advised patient to take 1200mg of calcium daily °o Will review coupon/cost assistance for Prevagen °• Patient self care activities - Over the next 180 days, patient will: °o Take Vitamin C 500mg daily and take EmergenC when she feels like she may be getting sick °o Take 1200mg of calcium daily ° °Medication management °• Pharmacist Clinical Goal(s): °o Over the next 180 days, patient will work with PharmD and providers to maintain optimal medication adherence °• Current pharmacy: [facility] °• Interventions °o Comprehensive medication review performed. °o Continue current medication management strategy °• Patient self care activities - Over the next 180 days, patient will: °o Focus on medication adherence by using a pill box to organize medications °o Take medications as prescribed °o   Report any questions or concerns to PharmD and/or provider(s)  Initial goal documentation        Vickie Brown was given information about Chronic Care Management services today including:  1. CCM service includes personalized support from designated clinical staff supervised by her physician, including individualized plan of care  and coordination with other care providers 2. 24/7 contact phone numbers for assistance for urgent and routine care needs. 3. Standard insurance, coinsurance, copays and deductibles apply for chronic care management only during months in which we provide at least 20 minutes of these services. Most insurances cover these services at 100%, however patients may be responsible for any copay, coinsurance and/or deductible if applicable. This service may help you avoid the need for more expensive face-to-face services. 4. Only one practitioner may furnish and bill the service in a calendar month. 5. The patient may stop CCM services at any time (effective at the end of the month) by phone call to the office staff.  Patient agreed to services and verbal consent obtained.   The patient verbalized understanding of instructions provided today and agreed to receive a mailed copy of patient instruction and/or educational materials. Telephone follow up appointment with pharmacy team member scheduled for: 10/31/19 @ 3:30 PM  Beryle Flock, PharmD Clinical Pharmacist [facility] [phone]   Diabetes Mellitus and Nutrition, Adult When you have diabetes (diabetes mellitus), it is very important to have healthy eating habits because your blood sugar (glucose) levels are greatly affected by what you eat and drink. Eating healthy foods in the appropriate amounts, at about the same times every day, can help you:  Control your blood glucose.  Lower your risk of heart disease.  Improve your blood pressure.  Reach or maintain a healthy weight. Every person with diabetes is different, and each person has different needs for a meal plan. Your health care provider may recommend that you work with a diet and nutrition specialist (dietitian) to make a meal plan that is best for you. Your meal plan may vary depending on factors such as:  The calories you need.  The medicines you  take.  Your weight.  Your blood glucose, blood pressure, and cholesterol levels.  Your activity level.  Other health conditions you have, such as heart or kidney disease. How do carbohydrates affect me? Carbohydrates, also called carbs, affect your blood glucose level more than any other type of food. Eating carbs naturally raises the amount of glucose in your blood. Carb counting is a method for keeping track of how many carbs you eat. Counting carbs is important to keep your blood glucose at a healthy level, especially if you use insulin or take certain oral diabetes medicines. It is important to know how many carbs you can safely have in each meal. This is different for every person. Your dietitian can help you calculate how many carbs you should have at each meal and for each snack. Foods that contain carbs include:  Bread, cereal, rice, pasta, and crackers.  Potatoes and corn.  Peas, beans, and lentils.  Milk and yogurt.  Fruit and juice.  Desserts, such as cakes, cookies, ice cream, and candy. How does alcohol affect me? Alcohol can cause a sudden decrease in blood glucose (hypoglycemia), especially if you use insulin or take certain oral diabetes medicines. Hypoglycemia can be a life-threatening condition. Symptoms of hypoglycemia (sleepiness, dizziness, and confusion) are similar to symptoms of having too much alcohol. If your health care provider says that alcohol  is safe for you, follow these guidelines:  Limit alcohol intake to no more than 1 drink per day for nonpregnant women and 2 drinks per day for men. One drink equals 12 oz of beer, 5 oz of wine, or 1 oz of hard liquor.  Do not drink on an empty stomach.  Keep yourself hydrated with water, diet soda, or unsweetened iced tea.  Keep in mind that regular soda, juice, and other mixers may contain a lot of sugar and must be counted as carbs. What are tips for following this plan?  Reading food labels  Start by  checking the serving size on the "Nutrition Facts" label of packaged foods and drinks. The amount of calories, carbs, fats, and other nutrients listed on the label is based on one serving of the item. Many items contain more than one serving per package.  Check the total grams (g) of carbs in one serving. You can calculate the number of servings of carbs in one serving by dividing the total carbs by 15. For example, if a food has 30 g of total carbs, it would be equal to 2 servings of carbs.  Check the number of grams (g) of saturated and trans fats in one serving. Choose foods that have low or no amount of these fats.  Check the number of milligrams (mg) of salt (sodium) in one serving. Most people should limit total sodium intake to less than 2,300 mg per day.  Always check the nutrition information of foods labeled as "low-fat" or "nonfat". These foods may be higher in added sugar or refined carbs and should be avoided.  Talk to your dietitian to identify your daily goals for nutrients listed on the label. Shopping  Avoid buying canned, premade, or processed foods. These foods tend to be high in fat, sodium, and added sugar.  Shop around the outside edge of the grocery store. This includes fresh fruits and vegetables, bulk grains, fresh meats, and fresh dairy. Cooking  Use low-heat cooking methods, such as baking, instead of high-heat cooking methods like deep frying.  Cook using healthy oils, such as olive, canola, or sunflower oil.  Avoid cooking with butter, cream, or high-fat meats. Meal planning  Eat meals and snacks regularly, preferably at the same times every day. Avoid going long periods of time without eating.  Eat foods high in fiber, such as fresh fruits, vegetables, beans, and whole grains. Talk to your dietitian about how many servings of carbs you can eat at each meal.  Eat 4-6 ounces (oz) of lean protein each day, such as lean meat, chicken, fish, eggs, or tofu. One oz  of lean protein is equal to: ? 1 oz of meat, chicken, or fish. ? 1 egg. ?  cup of tofu.  Eat some foods each day that contain healthy fats, such as avocado, nuts, seeds, and fish. Lifestyle  Check your blood glucose regularly.  Exercise regularly as told by your health care provider. This may include: ? 150 minutes of moderate-intensity or vigorous-intensity exercise each week. This could be brisk walking, biking, or water aerobics. ? Stretching and doing strength exercises, such as yoga or weightlifting, at least 2 times a week.  Take medicines as told by your health care provider.  Do not use any products that contain nicotine or tobacco, such as cigarettes and e-cigarettes. If you need help quitting, ask your health care provider.  Work with a Veterinary surgeon or diabetes educator to identify strategies to manage stress and any  emotional and social challenges. Questions to ask a health care provider  Do I need to meet with a diabetes educator?  Do I need to meet with a dietitian?  What number can I call if I have questions?  When are the best times to check my blood glucose? Where to find more information:  American Diabetes Association: diabetes.org  Academy of Nutrition and Dietetics: www.eatright.[employer] of Diabetes and Digestive and Kidney Diseases (NIH): CarFlippers.tn Summary  A healthy meal plan will help you control your blood glucose and maintain a healthy lifestyle.  Working with a diet and nutrition specialist (dietitian) can help you make a meal plan that is best for you.  Keep in mind that carbohydrates (carbs) and alcohol have immediate effects on your blood glucose levels. It is important to count carbs and to use alcohol carefully. This information is not intended to replace advice given to you by your health care provider. Make sure you discuss any questions you have with your health care provider. Document Revised: 12/10/2016 Document  Reviewed: 02/02/2016 Elsevier Patient Education  2020 [employer].

## 2019-09-20 DIAGNOSIS — U071 COVID-19: Secondary | ICD-10-CM | POA: Diagnosis not present

## 2019-10-03 ENCOUNTER — Other Ambulatory Visit: Payer: Self-pay | Admitting: Internal Medicine

## 2019-10-03 ENCOUNTER — Other Ambulatory Visit: Payer: Self-pay

## 2019-10-03 ENCOUNTER — Encounter: Payer: Self-pay | Admitting: Internal Medicine

## 2019-10-03 ENCOUNTER — Ambulatory Visit: Payer: Medicare Other

## 2019-10-03 ENCOUNTER — Ambulatory Visit (INDEPENDENT_AMBULATORY_CARE_PROVIDER_SITE_OTHER): Payer: Medicare Other | Admitting: Internal Medicine

## 2019-10-03 VITALS — BP 130/74 | HR 84 | Temp 98.6°F | Ht 59.6 in | Wt 152.6 lb

## 2019-10-03 DIAGNOSIS — Z1231 Encounter for screening mammogram for malignant neoplasm of breast: Secondary | ICD-10-CM

## 2019-10-03 DIAGNOSIS — E6609 Other obesity due to excess calories: Secondary | ICD-10-CM

## 2019-10-03 DIAGNOSIS — E559 Vitamin D deficiency, unspecified: Secondary | ICD-10-CM

## 2019-10-03 DIAGNOSIS — Z0000 Encounter for general adult medical examination without abnormal findings: Secondary | ICD-10-CM

## 2019-10-03 DIAGNOSIS — E1122 Type 2 diabetes mellitus with diabetic chronic kidney disease: Secondary | ICD-10-CM | POA: Diagnosis not present

## 2019-10-03 DIAGNOSIS — M545 Low back pain: Secondary | ICD-10-CM | POA: Diagnosis not present

## 2019-10-03 DIAGNOSIS — Z6830 Body mass index (BMI) 30.0-30.9, adult: Secondary | ICD-10-CM

## 2019-10-03 DIAGNOSIS — N182 Chronic kidney disease, stage 2 (mild): Secondary | ICD-10-CM

## 2019-10-03 DIAGNOSIS — H6123 Impacted cerumen, bilateral: Secondary | ICD-10-CM

## 2019-10-03 DIAGNOSIS — I129 Hypertensive chronic kidney disease with stage 1 through stage 4 chronic kidney disease, or unspecified chronic kidney disease: Secondary | ICD-10-CM | POA: Diagnosis not present

## 2019-10-03 DIAGNOSIS — G8929 Other chronic pain: Secondary | ICD-10-CM

## 2019-10-03 NOTE — Progress Notes (Signed)
I,Tianna Badgett,acting as a Education administrator for Maximino Greenland, MD.,have documented all relevant documentation on the behalf of Maximino Greenland, MD,as directed by  Maximino Greenland, MD while in the presence of Maximino Greenland, MD.  This visit occurred during the SARS-CoV-2 public health emergency.  Safety protocols were in place, including screening questions prior to the visit, additional usage of staff PPE, and extensive cleaning of exam room while observing appropriate contact time as indicated for disinfecting solutions.  Subjective:     Patient ID: Vickie Brown , female    DOB: 20-Mar-1940 , 79 y.o.   MRN: [id number]   Chief Complaint  Patient presents with  . Annual Exam  . Diabetes  . Hypertension    HPI  Patient presents today for a full physical exam. She reports compliance with meds. She denies headaches, chest pain, palpitations and shortness of breath.   Diabetes She presents for her follow-up diabetic visit. She has type 2 diabetes mellitus. Her disease course has been stable. Hypoglycemia symptoms include headaches. Associated symptoms include fatigue. Pertinent negatives for diabetes include no blurred vision and no chest pain. There are no hypoglycemic complications. Risk factors for coronary artery disease include diabetes mellitus, dyslipidemia, hypertension and post-menopausal. She is following a diabetic diet. She participates in exercise intermittently. Her breakfast blood glucose is taken between 8-9 am. Her breakfast blood glucose range is generally 90-110 mg/dl. An ACE inhibitor/angiotensin II receptor blocker is being taken. She does not see a podiatrist. Hypertension This is a chronic problem. The current episode started more than 1 year ago. The problem has been gradually improving since onset. Associated symptoms include headaches. Pertinent negatives include no blurred vision, chest pain, palpitations or shortness of breath. The current treatment provides moderate  improvement. Hypertensive end-organ damage includes kidney disease. Identifiable causes of hypertension include chronic renal disease.     Past Medical History:  Diagnosis Date  . Chronic headaches   . Diverticulitis   . Hyperlipidemia   . Knee pain   . Type 2 diabetes mellitus (HCC)      Family History  Problem Relation Age of Onset  . Cervical cancer Mother   . Cancer Mother   . Diabetes Father   . Diabetes Sister   . Diabetes Brother   . Breast cancer Neg Hx      Current Outpatient Medications:  .  ascorbic acid (VITAMIN C) 500 MG tablet, Take 500 mg by mouth daily., Disp: , Rfl:  .  aspirin 81 MG tablet, Take 81 mg by mouth every evening. , Disp: , Rfl:  .  BIOTIN PO, Take by mouth daily., Disp: , Rfl:  .  Calcium Carbonate (CALCIUM 500 PO), Take by mouth., Disp: , Rfl:  .  cetirizine (ZYRTEC) 10 MG tablet, Take 10 mg by mouth at bedtime., Disp: , Rfl:  .  Cholecalciferol (VITAMIN D3) 2000 UNITS TABS, Take 1 tablet by mouth daily.  , Disp: , Rfl:  .  diclofenac sodium (VOLTAREN) 1 % GEL, APPLY 2 GRAMS TOPICALLY TO KNEES FOUR TIMES DAILY AS NEEDED, Disp: 100 g, Rfl: 1 .  esomeprazole (NEXIUM) 40 MG capsule, Take 40 mg by mouth daily before breakfast.  , Disp: , Rfl:  .  fluticasone (FLONASE) 50 MCG/ACT nasal spray, Place 1 spray into both nostrils at bedtime., Disp: 16 g, Rfl: 2 .  Homeopathic Products (OSCILLOCOCCINUM PO), Take by mouth., Disp: , Rfl:  .  JANUVIA 100 MG tablet, TAKE 1 TABLET(100 MG) BY MOUTH DAILY,  Disp: 90 tablet, Rfl: 1 .  losartan (COZAAR) 50 MG tablet, TAKE 1 TABLET(50 MG) BY MOUTH DAILY, Disp: 90 tablet, Rfl: 1 .  Multiple Vitamins-Minerals (ALIVE ONCE DAILY WOMENS 50+ PO), Take 1 tablet by mouth daily., Disp: , Rfl:  .  Omega-3 Fatty Acids (FISH OIL PO), Take by mouth., Disp: , Rfl:  .  pravastatin (PRAVACHOL) 40 MG tablet, TAKE 1 TABLET BY MOUTH DAILY, Disp: 90 tablet, Rfl: 1 .  Probiotic Product (ALIGN) 4 MG CAPS, Take 4 mg by mouth daily., Disp: ,  Rfl:  .  TURMERIC PO, Take by mouth., Disp: , Rfl:  .  vitamin B-12 (CYANOCOBALAMIN) 500 MCG tablet, Take 500 mcg by mouth daily., Disp: , Rfl:    No Known Allergies    The patient states she uses post menopausal status for birth control. Last LMP was No LMP recorded. Patient is postmenopausal.. Negative for Dysmenorrhea. Negative for: breast discharge, breast lump(s), breast pain and breast self exam. Associated symptoms include abnormal vaginal bleeding. Pertinent negatives include abnormal bleeding (hematology), anxiety, decreased libido, depression, difficulty falling sleep, dyspareunia, history of infertility, nocturia, sexual dysfunction, sleep disturbances, urinary incontinence, urinary urgency, vaginal discharge and vaginal itching. Diet regular.The patient states her exercise level is   intermittent.  . The patient's tobacco use is:  Social History   Tobacco Use  Smoking Status Never Smoker  Smokeless Tobacco Never Used  . She has been exposed to passive smoke. The patient's alcohol use is:  Social History   Substance and Sexual Activity  Alcohol Use Not Currently   Comment: occassionally    Review of Systems  Constitutional: Positive for fatigue.  HENT: Negative.   Eyes: Negative.  Negative for blurred vision.  Respiratory: Negative.  Negative for shortness of breath.   Cardiovascular: Negative.  Negative for chest pain and palpitations.  Gastrointestinal: Negative.   Endocrine: Negative.   Genitourinary: Negative.   Musculoskeletal: Positive for arthralgias and back pain.  Skin: Negative.   Allergic/Immunologic: Negative.   Neurological: Positive for headaches.  Hematological: Negative.   Psychiatric/Behavioral: Negative.      Today's Vitals   10/03/19 1048  BP: 130/74  Pulse: 84  Temp: 98.6 F (37 C)  TempSrc: Oral  Weight: 152 lb 9.6 oz (69.2 kg)  Height: 4' 11.6" (1.514 m)  PainSc: 6    Body mass index is 30.2 kg/m.   Objective:  Physical  Exam Vitals and nursing note reviewed.  Constitutional:      Appearance: Normal appearance.  HENT:     Head: Normocephalic and atraumatic.     Right Ear: Ear canal and external ear normal. There is impacted cerumen.     Left Ear: Ear canal and external ear normal. There is impacted cerumen.     Nose: Nose normal.     Mouth/Throat:     Mouth: Mucous membranes are moist.     Pharynx: Oropharynx is clear.  Eyes:     Extraocular Movements: Extraocular movements intact.     Conjunctiva/sclera: Conjunctivae normal.     Pupils: Pupils are equal, round, and reactive to light.  Cardiovascular:     Rate and Rhythm: Normal rate and regular rhythm.     Pulses: Normal pulses.          Dorsalis pedis pulses are 2+ on the right side and 2+ on the left side.     Heart sounds: Normal heart sounds.  Pulmonary:     Effort: Pulmonary effort is normal.  Breath sounds: Normal breath sounds.  Chest:     Breasts: Tanner Score is 5.        Right: Normal.        Left: Normal.  Abdominal:     General: Abdomen is flat. Bowel sounds are normal.     Palpations: Abdomen is soft.  Genitourinary:    Comments: deferred Musculoskeletal:        General: Normal range of motion.     Cervical back: Normal range of motion and neck supple.  Feet:     Right foot:     Protective Sensation: 5 sites tested. 5 sites sensed.     Skin integrity: Dry skin present.     Toenail Condition: Right toenails are normal.     Left foot:     Protective Sensation: 5 sites tested. 5 sites sensed.     Skin integrity: Dry skin present.     Toenail Condition: Left toenails are normal.  Skin:    General: Skin is warm and dry.  Neurological:     General: No focal deficit present.     Mental Status: She is alert and oriented to person, place, and time.  Psychiatric:        Mood and Affect: Mood normal.        Behavior: Behavior normal.         Assessment And Plan:     1. Encounter for annual physical exam Comments: A  full exam was performed.  Importance of monthly self breast exams was discussed with the patient. PATIENT IS ADVISED TO GET 30-45 MINUTES REGULAR EXERCISE NO LESS THAN FOUR TO FIVE DAYS PER WEEK - BOTH WEIGHTBEARING EXERCISES AND AEROBIC ARE RECOMMENDED.  PATIENT IS ADVISED TO FOLLOW A HEALTHY DIET WITH AT LEAST SIX FRUITS/VEGGIES PER DAY, DECREASE INTAKE OF RED MEAT, AND TO INCREASE FISH INTAKE TO TWO DAYS PER WEEK.  MEATS/FISH SHOULD NOT BE FRIED, BAKED OR BROILED IS PREFERABLE.  I SUGGEST WEARING SPF 50 SUNSCREEN ON EXPOSED PARTS AND ESPECIALLY WHEN IN THE DIRECT SUNLIGHT FOR AN EXTENDED PERIOD OF TIME.  PLEASE AVOID FAST FOOD RESTAURANTS AND INCREASE YOUR WATER INTAKE.   2. Type 2 diabetes mellitus with stage 2 chronic kidney disease, without long-term current use of insulin (HCC) Comments: Diabetic foot exam was performed. I DISCUSSED WITH THE PATIENT AT LENGTH REGARDING THE GOALS OF GLYCEMIC CONTROL AND POSSIBLE LONG-TERM COMPLICATIONS.  I  ALSO STRESSED THE IMPORTANCE OF COMPLIANCE WITH HOME GLUCOSE MONITORING, DIETARY RESTRICTIONS INCLUDING AVOIDANCE OF SUGARY DRINKS/PROCESSED FOODS,  ALONG WITH REGULAR EXERCISE.  I  ALSO STRESSED THE IMPORTANCE OF ANNUAL EYE EXAMS, SELF FOOT CARE AND COMPLIANCE WITH OFFICE VISITS.  - CBC - Hemoglobin A1c - CMP14+EGFR  3. Hypertensive nephropathy Comments: Chronic, fair control. She will continue with current meds. EKG performed, NSR w/ low voltage. Encouraged to follow low sodium diet. She will rto in six months for re-evaluation.  - CBC - CMP14+EGFR - EKG 12-Lead  4. Bilateral impacted cerumen  AFTER OBTAINING VERBAL CONSENT, BOTH EARS WEREFLUSHED BY IRRIGATION. SHE TOLERATED PROCEDURE WELL WITHOUT ANY COMPLICATIONS. NO TM ABNORMALITIES WERE NOTED.  5. Chronic bilateral low back pain without sciatica Comments:Acute exacerbation of chronic condition. . Salonpas pain patch was applied to affected area, she was also given a sample. Advised she can get  more over the counter.Also encouraged to perform stretching exercises. Will consider PT if persistent.  Advised to call next week to let me know how she is doing.   6. Vitamin  D deficiency Comments: I will check vitamin D level and supplement as needed.  - VITAMIN D 25 Hydroxy (Vit-D Deficiency, Fractures)  7. Class 1 obesity due to excess calories with serious comorbidity and body mass index (BMI) of 30.0 to 30.9 in adult Comments: BMI is acceptable for her demographic. Encouraged to gradually increase her daily activity.   Wt Readings from Last 3 Encounters:  10/03/19 152 lb 9.6 oz (69.2 kg)  06/21/19 151 lb (68.5 kg)  06/21/19 151 lb 14.4 oz (68.9 kg)       Patient was given opportunity to ask questions. Patient verbalized understanding of the plan and was able to repeat key elements of the plan. All questions were answered to their satisfaction.   Maximino Greenland, MD   I, Maximino Greenland, MD, have reviewed all documentation for this visit. The documentation on 10/07/19 for the exam, diagnosis, procedures, and orders are all accurate and complete.  THE PATIENT IS ENCOURAGED TO PRACTICE SOCIAL DISTANCING DUE TO THE COVID-19 PANDEMIC.

## 2019-10-03 NOTE — Patient Instructions (Signed)
Health Maintenance, Female Adopting a healthy lifestyle and getting preventive care are important in promoting health and wellness. Ask your health care provider about:  The right schedule for you to have regular tests and exams.  Things you can do on your own to prevent diseases and keep yourself healthy. What should I know about diet, weight, and exercise? Eat a healthy diet   Eat a diet that includes plenty of vegetables, fruits, low-fat dairy products, and lean protein.  Do not eat a lot of foods that are high in solid fats, added sugars, or sodium. Maintain a healthy weight Body mass index (BMI) is used to identify weight problems. It estimates body fat based on height and weight. Your health care provider can help determine your BMI and help you achieve or maintain a healthy weight. Get regular exercise Get regular exercise. This is one of the most important things you can do for your health. Most adults should:  Exercise for at least 150 minutes each week. The exercise should increase your heart rate and make you sweat (moderate-intensity exercise).  Do strengthening exercises at least twice a week. This is in addition to the moderate-intensity exercise.  Spend less time sitting. Even light physical activity can be beneficial. Watch cholesterol and blood lipids Have your blood tested for lipids and cholesterol at 80 years of age, then have this test every 5 years. Have your cholesterol levels checked more often if:  Your lipid or cholesterol levels are high.  You are older than 80 years of age.  You are at high risk for heart disease. What should I know about cancer screening? Depending on your health history and family history, you may need to have cancer screening at various ages. This may include screening for:  Breast cancer.  Cervical cancer.  Colorectal cancer.  Skin cancer.  Lung cancer. What should I know about heart disease, diabetes, and high blood  pressure? Blood pressure and heart disease  High blood pressure causes heart disease and increases the risk of stroke. This is more likely to develop in people who have high blood pressure readings, are of African descent, or are overweight.  Have your blood pressure checked: ? Every 3-5 years if you are 18-39 years of age. ? Every year if you are 40 years old or older. Diabetes Have regular diabetes screenings. This checks your fasting blood sugar level. Have the screening done:  Once every three years after age 40 if you are at a normal weight and have a low risk for diabetes.  More often and at a younger age if you are overweight or have a high risk for diabetes. What should I know about preventing infection? Hepatitis B If you have a higher risk for hepatitis B, you should be screened for this virus. Talk with your health care provider to find out if you are at risk for hepatitis B infection. Hepatitis C Testing is recommended for:  Everyone born from 1945 through 1965.  Anyone with known risk factors for hepatitis C. Sexually transmitted infections (STIs)  Get screened for STIs, including gonorrhea and chlamydia, if: ? You are sexually active and are younger than 80 years of age. ? You are older than 80 years of age and your health care provider tells you that you are at risk for this type of infection. ? Your sexual activity has changed since you were last screened, and you are at increased risk for chlamydia or gonorrhea. Ask your health care provider if   you are at risk.  Ask your health care provider about whether you are at high risk for HIV. Your health care provider may recommend a prescription medicine to help prevent HIV infection. If you choose to take medicine to prevent HIV, you should first get tested for HIV. You should then be tested every 3 months for as long as you are taking the medicine. Pregnancy  If you are about to stop having your period (premenopausal) and  you may become pregnant, seek counseling before you get pregnant.  Take 400 to 800 micrograms (mcg) of folic acid every day if you become pregnant.  Ask for birth control (contraception) if you want to prevent pregnancy. Osteoporosis and menopause Osteoporosis is a disease in which the bones lose minerals and strength with aging. This can result in bone fractures. If you are 65 years old or older, or if you are at risk for osteoporosis and fractures, ask your health care provider if you should:  Be screened for bone loss.  Take a calcium or vitamin D supplement to lower your risk of fractures.  Be given hormone replacement therapy (HRT) to treat symptoms of menopause. Follow these instructions at home: Lifestyle  Do not use any products that contain nicotine or tobacco, such as cigarettes, e-cigarettes, and chewing tobacco. If you need help quitting, ask your health care provider.  Do not use street drugs.  Do not share needles.  Ask your health care provider for help if you need support or information about quitting drugs. Alcohol use  Do not drink alcohol if: ? Your health care provider tells you not to drink. ? You are pregnant, may be pregnant, or are planning to become pregnant.  If you drink alcohol: ? Limit how much you use to 0-1 drink a day. ? Limit intake if you are breastfeeding.  Be aware of how much alcohol is in your drink. In the [place] one drink equals one 12 oz bottle of beer (355 mL), one 5 oz glass of wine (148 mL), or one 1 oz glass of hard liquor (44 mL). General instructions  Schedule regular health, dental, and eye exams.  Stay current with your vaccines.  Tell your health care provider if: ? You often feel depressed. ? You have ever been abused or do not feel safe at home. Summary  Adopting a healthy lifestyle and getting preventive care are important in promoting health and wellness.  Follow your health care provider's instructions about healthy  diet, exercising, and getting tested or screened for diseases.  Follow your health care provider's instructions on monitoring your cholesterol and blood pressure. This information is not intended to replace advice given to you by your health care provider. Make sure you discuss any questions you have with your health care provider. Document Revised: 12/21/2017 Document Reviewed: 12/21/2017 Elsevier Patient Education  2020 Elsevier Inc.  

## 2019-10-04 LAB — CMP14+EGFR
ALT: 20 IU/L (ref 0–32)
AST: 26 IU/L (ref 0–40)
Albumin/Globulin Ratio: 1.8 (ref 1.2–2.2)
Albumin: 4.5 g/dL (ref 3.7–4.7)
Alkaline Phosphatase: 57 IU/L (ref 44–121)
BUN/Creatinine Ratio: 8 — ABNORMAL LOW (ref 12–28)
BUN: 7 mg/dL — ABNORMAL LOW (ref 8–27)
Bilirubin Total: 0.2 mg/dL (ref 0.0–1.2)
CO2: 29 mmol/L (ref 20–29)
Calcium: 9.6 mg/dL (ref 8.7–10.3)
Chloride: 100 mmol/L (ref 96–106)
Creatinine, Ser: 0.86 mg/dL (ref 0.57–1.00)
GFR calc Af Amer: 74 mL/min/{1.73_m2} (ref >59)
GFR calc non Af Amer: 64 mL/min/{1.73_m2} (ref >59)
Globulin, Total: 2.5 g/dL (ref 1.5–4.5)
Glucose: 117 mg/dL — ABNORMAL HIGH (ref 65–99)
Potassium: 4.6 mmol/L (ref 3.5–5.2)
Sodium: 140 mmol/L (ref 134–144)
Total Protein: 7 g/dL (ref 6.0–8.5)

## 2019-10-04 LAB — CBC
Hematocrit: 39.1 % (ref 34.0–46.6)
Hemoglobin: 12.7 g/dL (ref 11.1–15.9)
MCH: 28.5 pg (ref 26.6–33.0)
MCHC: 32.5 g/dL (ref 31.5–35.7)
MCV: 88 fL (ref 79–97)
Platelets: 250 10*3/uL (ref 150–450)
RBC: 4.45 x10E6/uL (ref 3.77–5.28)
RDW: 13.2 % (ref 11.7–15.4)
WBC: 4.6 10*3/uL (ref 3.4–10.8)

## 2019-10-04 LAB — VITAMIN D 25 HYDROXY (VIT D DEFICIENCY, FRACTURES): Vit D, 25-Hydroxy: 56.7 ng/mL (ref 30.0–100.0)

## 2019-10-04 LAB — HEMOGLOBIN A1C
Est. average glucose Bld gHb Est-mCnc: 137 mg/dL
Hgb A1c MFr Bld: 6.4 % — ABNORMAL HIGH (ref 4.8–5.6)

## 2019-10-16 ENCOUNTER — Telehealth: Payer: Self-pay

## 2019-10-16 NOTE — Progress Notes (Signed)
Chronic Care Management Pharmacy Assistant   Name: Vickie Brown  MRN: [id number] DOB: 10/03/1940    Reason for Encounter: Disease State - Diabetes and Lipids Adherence Call.   Patient Questions:   1.  Have you seen any other providers since your last visit? Yes. 10/03/2019 - Dr. Dorothyann Peng    2.  Any changes in your medicines or health? No     PCP : Dorothyann Peng, MD  Allergies:  No Known Allergies  Medications: Outpatient Encounter Medications as of 10/16/2019  Medication Sig  . ascorbic acid (VITAMIN C) 500 MG tablet Take 500 mg by mouth daily.  Marland Kitchen aspirin 81 MG tablet Take 81 mg by mouth every evening.   Marland Kitchen BIOTIN PO Take by mouth daily.  . Calcium Carbonate (CALCIUM 500 PO) Take by mouth.  . cetirizine (ZYRTEC) 10 MG tablet Take 10 mg by mouth at bedtime.  . Cholecalciferol (VITAMIN D3) 2000 UNITS TABS Take 1 tablet by mouth daily.    . diclofenac sodium (VOLTAREN) 1 % GEL APPLY 2 GRAMS TOPICALLY TO KNEES FOUR TIMES DAILY AS NEEDED  . esomeprazole (NEXIUM) 40 MG capsule Take 40 mg by mouth daily before breakfast.    . fluticasone (FLONASE) 50 MCG/ACT nasal spray Place 1 spray into both nostrils at bedtime.  . Homeopathic Products (OSCILLOCOCCINUM PO) Take by mouth.  Marland Kitchen JANUVIA 100 MG tablet TAKE 1 TABLET(100 MG) BY MOUTH DAILY  . losartan (COZAAR) 50 MG tablet TAKE 1 TABLET(50 MG) BY MOUTH DAILY  . Multiple Vitamins-Minerals (ALIVE ONCE DAILY WOMENS 50+ PO) Take 1 tablet by mouth daily.  . Omega-3 Fatty Acids (FISH OIL PO) Take by mouth.  . pravastatin (PRAVACHOL) 40 MG tablet TAKE 1 TABLET BY MOUTH DAILY  . Probiotic Product (ALIGN) 4 MG CAPS Take 4 mg by mouth daily.  . TURMERIC PO Take by mouth.  . vitamin B-12 (CYANOCOBALAMIN) 500 MCG tablet Take 500 mcg by mouth daily.   No facility-administered encounter medications on file as of 10/16/2019.    Current Diagnosis: Patient Active Problem List   Diagnosis Date Noted  . Type 2 diabetes mellitus with stage  2 chronic kidney disease, without long-term current use of insulin (HCC) 12/22/2017  . Chronic renal disease, stage II 12/22/2017  . Hypertensive nephropathy 12/22/2017  . Dermatitis 12/22/2017  . Dyspnea on exertion 06/22/2010  . Myalgia 06/22/2010   Recent Relevant Labs: Lab Results  Component Value Date/Time   HGBA1C 6.4 (H) 10/03/2019 02:17 PM   HGBA1C 6.3 (H) 06/21/2019 12:24 PM   MICROALBUR 10 06/21/2019 12:56 PM    Kidney Function Lab Results  Component Value Date/Time   CREATININE 0.86 10/03/2019 02:17 PM   CREATININE 0.95 06/21/2019 12:24 PM   GFRNONAA 64 10/03/2019 02:17 PM   GFRAA 74 10/03/2019 02:17 PM    Lipid Panel    Component Value Date/Time   CHOL 143 02/14/2019 1613   TRIG 90 02/14/2019 1613   HDL 71 02/14/2019 1613   LDLCALC 55 02/14/2019 1613   10/16/2019 - Called patient left message to call me back..  10/25/2019 - Called patient left message to call me back.  10/26/2019 - Called patient left message to call me back  10/26/2019 -  Incoming call - patient left message for me to call back on 10/29/2019  10/29/2019 - Called patient left message that I was returning call.  10/30/2019 - Called patient left message to call me back.  10/30/2019 - Incoming call - Patient left message for  me to call her back .  11/01/2019- Called patient - left message to call me back.  10:26/2021 - Called patient - left message to call me back.   Follow-Up:  Pharmacist Review  -  Called patient to do Adherence Call for Diabetes and Lipid, unable to reach patient.  Willa Frater. CPP notified  Jon Gills, [facility] Clinical Pharmacist Assistant [phone]

## 2019-10-19 ENCOUNTER — Other Ambulatory Visit: Payer: Self-pay | Admitting: Internal Medicine

## 2019-10-31 ENCOUNTER — Telehealth: Payer: Self-pay

## 2019-10-31 DIAGNOSIS — E1165 Type 2 diabetes mellitus with hyperglycemia: Secondary | ICD-10-CM | POA: Diagnosis not present

## 2019-10-31 DIAGNOSIS — E119 Type 2 diabetes mellitus without complications: Secondary | ICD-10-CM | POA: Diagnosis not present

## 2019-12-12 ENCOUNTER — Telehealth: Payer: Self-pay

## 2019-12-12 NOTE — Chronic Care Management (AMB) (Signed)
Chronic Care Management Pharmacy Assistant   Name: Vickie Brown  MRN: [id number] DOB: 09/24/1940  Reason for Encounter: Disease State- Diabetes and Lipids Adherence Call.  Patient Questions:  1.  Have you seen any other providers since your last visit? No   2.  Any changes in your medicines or health? No    PCP : Dorothyann Peng, MD  Allergies:  No Known Allergies  Medications: Outpatient Encounter Medications as of 12/12/2019  Medication Sig  . ascorbic acid (VITAMIN C) 500 MG tablet Take 500 mg by mouth daily.  Marland Kitchen aspirin 81 MG tablet Take 81 mg by mouth every evening.   Marland Kitchen BIOTIN PO Take by mouth daily.  . Calcium Carbonate (CALCIUM 500 PO) Take by mouth.  . cetirizine (ZYRTEC) 10 MG tablet Take 10 mg by mouth at bedtime.  . Cholecalciferol (VITAMIN D3) 2000 UNITS TABS Take 1 tablet by mouth daily.    . diclofenac sodium (VOLTAREN) 1 % GEL APPLY 2 GRAMS TOPICALLY TO KNEES FOUR TIMES DAILY AS NEEDED  . esomeprazole (NEXIUM) 40 MG capsule Take 40 mg by mouth daily before breakfast.    . fluticasone (FLONASE) 50 MCG/ACT nasal spray Place 1 spray into both nostrils at bedtime.  . Homeopathic Products (OSCILLOCOCCINUM PO) Take by mouth.  Marland Kitchen JANUVIA 100 MG tablet TAKE 1 TABLET(100 MG) BY MOUTH DAILY  . losartan (COZAAR) 50 MG tablet TAKE 1 TABLET(50 MG) BY MOUTH DAILY  . Multiple Vitamins-Minerals (ALIVE ONCE DAILY WOMENS 50+ PO) Take 1 tablet by mouth daily.  . Omega-3 Fatty Acids (FISH OIL PO) Take by mouth.  . pravastatin (PRAVACHOL) 40 MG tablet TAKE 1 TABLET BY MOUTH DAILY  . Probiotic Product (ALIGN) 4 MG CAPS Take 4 mg by mouth daily.  . TURMERIC PO Take by mouth.  . vitamin B-12 (CYANOCOBALAMIN) 500 MCG tablet Take 500 mcg by mouth daily.   No facility-administered encounter medications on file as of 12/12/2019.    Current Diagnosis: Patient Active Problem List   Diagnosis Date Noted  . Type 2 diabetes mellitus with stage 2 chronic kidney disease, without long-term  current use of insulin (HCC) 12/22/2017  . Chronic renal disease, stage II 12/22/2017  . Hypertensive nephropathy 12/22/2017  . Dermatitis 12/22/2017  . Dyspnea on exertion 06/22/2010  . Myalgia 06/22/2010   Recent Relevant Labs: Lab Results  Component Value Date/Time   HGBA1C 6.4 (H) 10/03/2019 02:17 PM   HGBA1C 6.3 (H) 06/21/2019 12:24 PM   MICROALBUR 10 06/21/2019 12:56 PM    Kidney Function Lab Results  Component Value Date/Time   CREATININE 0.86 10/03/2019 02:17 PM   CREATININE 0.95 06/21/2019 12:24 PM   GFRNONAA 64 10/03/2019 02:17 PM   GFRAA 74 10/03/2019 02:17 PM    . Current antihyperglycemic regimen:  o Januvia 100mg  daily  . What recent interventions/DTPs have been made to improve glycemic control:  o Patient stated she is taking her medication a directed by provider  . Have there been any recent hospitalizations or ED visits since last visit with CPP? No  . Patient denies hypoglycemic symptoms, including Pale, Sweaty, Shaky, Hungry, Nervous/irritable and Vision changes . Patient denies hyperglycemic symptoms, including blurry vision, excessive thirst, polyuria and weakness   How often are you checking your blood sugar?  Gaol - Check blood sugar once daily Patient states she is taking fasting blood sugar daily  . What are your blood sugars ranging?  o Fasting: 12/14/2019  122 o Before meals: none o After meals: none  o Bedtime: none  . During the week, how often does your blood glucose drop below 70?No   . Are you checking your feet daily/regularly? Yes,  Denies any open sores, numbness, tingling.  Patient states she is having swelling in feet, ankles, but states  right ankle is the worst x two months.  Adherence Review: Is the patient currently on a STATIN medication? Yes Is the patient currently on ACE/ARB medication?  Yes Does the patient have >5 day gap between last estimated fill dates? No    Comprehensive medication review performed; Spoke to patient  regarding cholesterol  Lipid Panel    Component Value Date/Time   CHOL 143 02/14/2019 1613   TRIG 90 02/14/2019 1613   HDL 71 02/14/2019 1613   LDLCALC 55 02/14/2019 1613    10-year ASCVD risk score: The ASCVD Risk score Denman George DC Jr., et al., 2013) failed to calculate for the following reasons:   The 2013 ASCVD risk score is only valid for ages 58 to 18  . Current antihyperlipidemic regimen:            Pravastatin 40 mg daily . Previous antihyperlipidemic medications tried: none  . ASCVD risk enhancing conditions: age >71 and DM   . What recent interventions/DTPs have been made by any provider to improve Cholesterol control since last CPP Visit: Patient taking her medication as directed by the provider  . Any recent hospitalizations or ED visits since last visit with CPP? No    . What diet changes have been made to improve Cholesterol?  o Gaol - Eat baked lean meats (chicken, fish) o Patient states she is eating healthy, eating baked foods and watching salt intake. . What exercise is being done to improve Cholesterol?  o Goal - exercise for 30 minutes daily 5 times per week o Patient states she has no exercise regimen but is active at least 30 min per day (cleans house, wash clothes)   Adherence Review: Does the patient have >5 day gap between last estimated fill dates? no   Goals Addressed            This Visit's Progress   . Pharmacy Care Plan   On track    CARE PLAN ENTRY (see longitudinal plan of care for additional care plan information)  Current Barriers:  . Chronic Disease Management support, education, and care coordination needs related to Hypertension, Hyperlipidemia, and Diabetes   Hypertension BP Readings from Last 3 Encounters:  06/21/19 126/74  06/21/19 126/74  02/14/19 (!) 105/54   . Pharmacist Clinical Goal(s): o Over the next 180 days, patient will work with PharmD and providers to maintain BP goal <130/80 . Current regimen:  o Losartan 50mg   daily . Interventions: o Provided dietary and exercise recommendations . Patient self care activities - Over the next 180 days, patient will: o Check BP if symptomatic, document, and provide at future appointments o Ensure daily salt intake < 2300 mg/day  Hyperlipidemia Lab Results  Component Value Date/Time   LDLCALC 55 02/14/2019 04:13 PM   . Pharmacist Clinical Goal(s): o Over the next 180 days, patient will work with PharmD and providers to maintain LDL goal < 70 . Current regimen:  o Pravastatin 40mg  daily . Interventions: o Provided dietary and exercise recommendations . Patient self care activities - Over the next 180 days, patient will: o Exercise for 30 minutes daily 5 times per week (150 minutes per week total) o Eat baked lean meats (chicken, fish)  Diabetes Lab  Results  Component Value Date/Time   HGBA1C 6.3 (H) 06/21/2019 12:24 PM   HGBA1C 6.1 (H) 02/14/2019 04:13 PM   . Pharmacist Clinical Goal(s): o Over the next 180 days, patient will work with PharmD and providers to maintain A1c goal <7% . Current regimen:  o Januvia 100mg  daily . Interventions: o Provided dietary and exercise recommendations o Discussed appropriate goals for fasting blood sugar (80-130) o Reviewed cost of Januvia. Affordable for patient. . Patient self care activities - Over the next 180 days, patient will: o Check blood sugar once daily, document, and provide at future appointments o Contact provider with any episodes of hypoglycemia  Health Maintenance . Pharmacist Clinical Goal(s) o Over the next 180 days, patient will work with PharmD and providers to optimize over the counter supplements . Current regimen:  . Turmeric daily . Align 4 mg daily  . Oscillococcinum daily  . Alive multivitamin daily  . Fluticasone nasal spray 1 spray both nostrils at bedtime  . Biotin daily . Cetirizine 10mg  daily . Calcium daily . Cyanocobalamin daily . Vitamin C 500mg  daily . EmergenC  1000mg  daily  . Prevagen daily . Tylenol 500mg  . Interventions: o Advised patient to only take 1 vitamin C supplement daily (she does not need both Vitamin C and EmergenC daily) o Advised patient to take 1200mg  of calcium daily o Will review coupon/cost assistance for Prevagen . Patient self care activities - Over the next 180 days, patient will: o Take Vitamin C 500mg  daily and take EmergenC when she feels like she may be getting sick o Take 1200mg  of calcium daily  Medication management . Pharmacist Clinical Goal(s): o Over the next 180 days, patient will work with PharmD and providers to maintain optimal medication adherence . Current pharmacy: [facility] . Interventions o Comprehensive medication review performed. o Continue current medication management strategy . Patient self care activities - Over the next 180 days, patient will: o Focus on medication adherence by using a pill box to organize medications o Take medications as prescribed o Report any questions or concerns to PharmD and/or provider(s)  Initial goal documentation        Follow-Up:  Pharmacist Review, patient states she is having fatigue, and swelling in feet and ankles, she forgets to tell the provider.   , CPP. Notified  , [facility] Clinical Pharmacist Assistant [phone]

## 2019-12-24 ENCOUNTER — Ambulatory Visit (INDEPENDENT_AMBULATORY_CARE_PROVIDER_SITE_OTHER): Payer: Medicare Other | Admitting: Internal Medicine

## 2019-12-24 ENCOUNTER — Encounter: Payer: Self-pay | Admitting: Internal Medicine

## 2019-12-24 ENCOUNTER — Other Ambulatory Visit: Payer: Self-pay

## 2019-12-24 VITALS — BP 112/64 | HR 82 | Temp 97.9°F | Ht 59.6 in | Wt 156.8 lb

## 2019-12-24 DIAGNOSIS — E1122 Type 2 diabetes mellitus with diabetic chronic kidney disease: Secondary | ICD-10-CM

## 2019-12-24 DIAGNOSIS — I700 Atherosclerosis of aorta: Secondary | ICD-10-CM

## 2019-12-24 DIAGNOSIS — M545 Low back pain, unspecified: Secondary | ICD-10-CM

## 2019-12-24 DIAGNOSIS — I129 Hypertensive chronic kidney disease with stage 1 through stage 4 chronic kidney disease, or unspecified chronic kidney disease: Secondary | ICD-10-CM

## 2019-12-24 DIAGNOSIS — R350 Frequency of micturition: Secondary | ICD-10-CM

## 2019-12-24 DIAGNOSIS — N182 Chronic kidney disease, stage 2 (mild): Secondary | ICD-10-CM

## 2019-12-24 DIAGNOSIS — G8929 Other chronic pain: Secondary | ICD-10-CM

## 2019-12-24 DIAGNOSIS — Z6831 Body mass index (BMI) 31.0-31.9, adult: Secondary | ICD-10-CM

## 2019-12-24 DIAGNOSIS — E6609 Other obesity due to excess calories: Secondary | ICD-10-CM

## 2019-12-24 LAB — POCT URINALYSIS DIPSTICK
Bilirubin, UA: NEGATIVE
Blood, UA: NEGATIVE
Glucose, UA: NEGATIVE
Ketones, UA: NEGATIVE
Leukocytes, UA: NEGATIVE
Nitrite, UA: NEGATIVE
Protein, UA: NEGATIVE
Spec Grav, UA: 1.015 (ref 1.010–1.025)
Urobilinogen, UA: 0.2 E.U./dL
pH, UA: 6 (ref 5.0–8.0)

## 2019-12-24 NOTE — Patient Instructions (Signed)
Diabetes Mellitus and Foot Care Foot care is an important part of your health, especially when you have diabetes. Diabetes may cause you to have problems because of poor blood flow (circulation) to your feet and legs, which can cause your skin to:  Become thinner and drier.  Break more easily.  Heal more slowly.  Peel and crack. You may also have nerve damage (neuropathy) in your legs and feet, causing decreased feeling in them. This means that you may not notice minor injuries to your feet that could lead to more serious problems. Noticing and addressing any potential problems early is the best way to prevent future foot problems. How to care for your feet Foot hygiene  Wash your feet daily with warm water and mild soap. Do not use hot water. Then, pat your feet and the areas between your toes until they are completely dry. Do not soak your feet as this can dry your skin.  Trim your toenails straight across. Do not dig under them or around the cuticle. File the edges of your nails with an emery board or nail file.  Apply a moisturizing lotion or petroleum jelly to the skin on your feet and to dry, brittle toenails. Use lotion that does not contain alcohol and is unscented. Do not apply lotion between your toes. Shoes and socks  Wear clean socks or stockings every day. Make sure they are not too tight. Do not wear knee-high stockings since they may decrease blood flow to your legs.  Wear shoes that fit properly and have enough cushioning. Always look in your shoes before you put them on to be sure there are no objects inside.  To break in new shoes, wear them for just a few hours a day. This prevents injuries on your feet. Wounds, scrapes, corns, and calluses  Check your feet daily for blisters, cuts, bruises, sores, and redness. If you cannot see the bottom of your feet, use a mirror or ask someone for help.  Do not cut corns or calluses or try to remove them with medicine.  If you  find a minor scrape, cut, or break in the skin on your feet, keep it and the skin around it clean and dry. You may clean these areas with mild soap and water. Do not clean the area with peroxide, alcohol, or iodine.  If you have a wound, scrape, corn, or callus on your foot, look at it several times a day to make sure it is healing and not infected. Check for: ? Redness, swelling, or pain. ? Fluid or blood. ? Warmth. ? Pus or a bad smell. General instructions  Do not cross your legs. This may decrease blood flow to your feet.  Do not use heating pads or hot water bottles on your feet. They may burn your skin. If you have lost feeling in your feet or legs, you may not know this is happening until it is too late.  Protect your feet from hot and cold by wearing shoes, such as at the beach or on hot pavement.  Schedule a complete foot exam at least once a year (annually) or more often if you have foot problems. If you have foot problems, report any cuts, sores, or bruises to your health care provider immediately. Contact a health care provider if:  You have a medical condition that increases your risk of infection and you have any cuts, sores, or bruises on your feet.  You have an injury that is not   healing.  You have redness on your legs or feet.  You feel burning or tingling in your legs or feet.  You have pain or cramps in your legs and feet.  Your legs or feet are numb.  Your feet always feel cold.  You have pain around a toenail. Get help right away if:  You have a wound, scrape, corn, or callus on your foot and: ? You have pain, swelling, or redness that gets worse. ? You have fluid or blood coming from the wound, scrape, corn, or callus. ? Your wound, scrape, corn, or callus feels warm to the touch. ? You have pus or a bad smell coming from the wound, scrape, corn, or callus. ? You have a fever. ? You have a red line going up your leg. Summary  Check your feet every day  for cuts, sores, red spots, swelling, and blisters.  Moisturize feet and legs daily.  Wear shoes that fit properly and have enough cushioning.  If you have foot problems, report any cuts, sores, or bruises to your health care provider immediately.  Schedule a complete foot exam at least once a year (annually) or more often if you have foot problems. This information is not intended to replace advice given to you by your health care provider. Make sure you discuss any questions you have with your health care provider. Document Revised: 09/20/2018 Document Reviewed: 01/30/2016 Elsevier Patient Education  2020 Elsevier Inc.  

## 2019-12-24 NOTE — Progress Notes (Signed)
I,Katawbba Wiggins,acting as a Education administrator for Maximino Greenland, MD.,have documented all relevant documentation on the behalf of Maximino Greenland, MD,as directed by  Maximino Greenland, MD while in the presence of Maximino Greenland, MD.  This visit occurred during the SARS-CoV-2 public health emergency.  Safety protocols were in place, including screening questions prior to the visit, additional usage of staff PPE, and extensive cleaning of exam room while observing appropriate contact time as indicated for disinfecting solutions.  Subjective:     Patient ID: Vickie Brown , female    DOB: 1940/12/17 , 79 y.o.   MRN: [id number]   Chief Complaint  Patient presents with  . Hypertension    HPI  She is here today for a diabetes and BP check. She reports compliance with meds. She reports her sugars are well controlled, today was 120 - highest it has been in several months.   Diabetes She presents for her follow-up diabetic visit. She has type 2 diabetes mellitus. Her disease course has been stable. There are no hypoglycemic associated symptoms. Pertinent negatives for hypoglycemia include no headaches. Pertinent negatives for diabetes include no blurred vision and no chest pain. There are no hypoglycemic complications. Risk factors for coronary artery disease include diabetes mellitus, dyslipidemia, hypertension and post-menopausal. She is following a diabetic diet. Her breakfast blood glucose is taken between 8-9 am. Her breakfast blood glucose range is generally 90-110 mg/dl.  Hypertension This is a chronic problem. The current episode started more than 1 year ago. The problem has been gradually improving since onset. Pertinent negatives include no blurred vision, chest pain, headaches, palpitations or shortness of breath.     Past Medical History:  Diagnosis Date  . Chronic headaches   . Diverticulitis   . Hyperlipidemia   . Knee pain   . Type 2 diabetes mellitus (HCC)      Family History   Problem Relation Age of Onset  . Cervical cancer Mother   . Cancer Mother   . Diabetes Father   . Diabetes Sister   . Diabetes Brother   . Breast cancer Neg Hx      Current Outpatient Medications:  .  ascorbic acid (VITAMIN C) 500 MG tablet, Take 500 mg by mouth daily., Disp: , Rfl:  .  aspirin 81 MG tablet, Take 81 mg by mouth every evening., Disp: , Rfl:  .  BIOTIN PO, Take by mouth daily., Disp: , Rfl:  .  Calcium Carbonate (CALCIUM 500 PO), Take by mouth., Disp: , Rfl:  .  cetirizine (ZYRTEC) 10 MG tablet, Take 10 mg by mouth at bedtime., Disp: , Rfl:  .  Cholecalciferol (VITAMIN D3) 2000 UNITS TABS, Take 1 tablet by mouth daily., Disp: , Rfl:  .  diclofenac sodium (VOLTAREN) 1 % GEL, APPLY 2 GRAMS TOPICALLY TO KNEES FOUR TIMES DAILY AS NEEDED, Disp: 100 g, Rfl: 1 .  esomeprazole (NEXIUM) 40 MG capsule, Take 40 mg by mouth daily before breakfast., Disp: , Rfl:  .  fluticasone (FLONASE) 50 MCG/ACT nasal spray, Place 1 spray into both nostrils at bedtime., Disp: 16 g, Rfl: 2 .  Homeopathic Products (OSCILLOCOCCINUM PO), Take by mouth., Disp: , Rfl:  .  JANUVIA 100 MG tablet, TAKE 1 TABLET(100 MG) BY MOUTH DAILY, Disp: 90 tablet, Rfl: 1 .  losartan (COZAAR) 50 MG tablet, TAKE 1 TABLET(50 MG) BY MOUTH DAILY, Disp: 90 tablet, Rfl: 1 .  Multiple Vitamins-Minerals (ALIVE ONCE DAILY WOMENS 50+ PO), Take 1 tablet by mouth  daily., Disp: , Rfl:  .  Omega-3 Fatty Acids (FISH OIL PO), Take by mouth., Disp: , Rfl:  .  pravastatin (PRAVACHOL) 40 MG tablet, TAKE 1 TABLET BY MOUTH DAILY, Disp: 90 tablet, Rfl: 1 .  Probiotic Product (ALIGN) 4 MG CAPS, Take 4 mg by mouth daily., Disp: , Rfl:  .  TURMERIC PO, Take by mouth., Disp: , Rfl:  .  vitamin B-12 (CYANOCOBALAMIN) 500 MCG tablet, Take 500 mcg by mouth daily., Disp: , Rfl:    No Known Allergies   Review of Systems  Constitutional: Negative.   Eyes: Negative for blurred vision.  Respiratory: Negative.  Negative for shortness of breath.    Cardiovascular: Negative.  Negative for chest pain and palpitations.  Gastrointestinal: Negative.   Genitourinary: Positive for frequency.  Musculoskeletal: Positive for back pain.       Still c/o low back pain. Denies LE weakness/paresthesias   Neurological: Negative.  Negative for headaches.  Psychiatric/Behavioral: Negative.      Today's Vitals   12/24/19 1421  BP: 112/64  Pulse: 82  Temp: 97.9 F (36.6 C)  TempSrc: Oral  Weight: 156 lb 12.8 oz (71.1 kg)  Height: 4' 11.6" (1.514 m)  PainSc: 6   PainLoc: Head   Body mass index is 31.04 kg/m.  Wt Readings from Last 3 Encounters:  12/24/19 156 lb 12.8 oz (71.1 kg)  10/03/19 152 lb 9.6 oz (69.2 kg)  06/21/19 151 lb (68.5 kg)   Objective:  Physical Exam Vitals and nursing note reviewed.  Constitutional:      Appearance: Normal appearance.  HENT:     Head: Normocephalic and atraumatic.  Cardiovascular:     Rate and Rhythm: Normal rate and regular rhythm.     Pulses:          Dorsalis pedis pulses are 2+ on the right side and 2+ on the left side.     Heart sounds: Normal heart sounds.  Pulmonary:     Effort: Pulmonary effort is normal.     Breath sounds: Normal breath sounds.  Feet:     Right foot:     Protective Sensation: 5 sites tested. 5 sites sensed.     Skin integrity: Dry skin present.     Toenail Condition: Right toenails are long.     Left foot:     Protective Sensation: 5 sites tested. 5 sites sensed.     Skin integrity: Dry skin present.     Toenail Condition: Left toenails are long.  Skin:    General: Skin is warm.  Neurological:     General: No focal deficit present.     Mental Status: She is alert.  Psychiatric:        Mood and Affect: Mood normal.        Behavior: Behavior normal.         Assessment And Plan:     1. Type 2 diabetes mellitus with stage 2 chronic kidney disease, without long-term current use of insulin (HCC) Comments: Diabetic foot exam was performed. I will check labs as  listed below. I will check her renal function as well.  - BMP8+EGFR - Hemoglobin A1c  2. Hypertensive nephropathy Comments: Chronic, well controlled. She will continue with current meds. She is encouraged to avoid adding salt to her foods.   3. Frequency of urination Comments: I will check urinalysis to r/o UTI.  - POCT Urinalysis Dipstick (81002)  4. Chronic bilateral low back pain without sciatica Comments: Chronic. She is  encouraged to consider use of topical pain cream to affected area. Also, will refer her to PT for strength/core training.  - Ambulatory referral to Physical Therapy  5. Atherosclerosis of aorta (HCC) Comments: Chronic, encouraged to follow heart healthy lifestyle. I also stressed importance of statin compliance.   6. Class 1 obesity due to excess calories with serious comorbidity and body mass index (BMI) of 31.0 to 31.9 in adult Comments: She is encouraged to incorporate more exercise into her daily routine. Advised to work up to at least 150 minutes per week.    Patient was given opportunity to ask questions. Patient verbalized understanding of the plan and was able to repeat key elements of the plan. All questions were answered to their satisfaction.  Robyn N Sanders, MD   I, Robyn N Sanders, MD, have reviewed all documentation for this visit. The documentation on 12/24/19 for the exam, diagnosis, procedures, and orders are all accurate and complete.  THE PATIENT IS ENCOURAGED TO PRACTICE SOCIAL DISTANCING DUE TO THE COVID-19 PANDEMIC.   

## 2019-12-25 LAB — BMP8+EGFR
BUN/Creatinine Ratio: 6 — ABNORMAL LOW (ref 12–28)
BUN: 6 mg/dL — ABNORMAL LOW (ref 8–27)
CO2: 27 mmol/L (ref 20–29)
Calcium: 9.5 mg/dL (ref 8.7–10.3)
Chloride: 103 mmol/L (ref 96–106)
Creatinine, Ser: 0.97 mg/dL (ref 0.57–1.00)
GFR calc Af Amer: 64 mL/min/{1.73_m2} (ref >59)
GFR calc non Af Amer: 55 mL/min/{1.73_m2} — ABNORMAL LOW (ref >59)
Glucose: 125 mg/dL — ABNORMAL HIGH (ref 65–99)
Potassium: 4.1 mmol/L (ref 3.5–5.2)
Sodium: 142 mmol/L (ref 134–144)

## 2019-12-25 LAB — HEMOGLOBIN A1C
Est. average glucose Bld gHb Est-mCnc: 140 mg/dL
Hgb A1c MFr Bld: 6.5 % — ABNORMAL HIGH (ref 4.8–5.6)

## 2020-01-07 ENCOUNTER — Other Ambulatory Visit: Payer: Self-pay | Admitting: Internal Medicine

## 2020-01-09 ENCOUNTER — Telehealth: Payer: Self-pay

## 2020-01-09 NOTE — Progress Notes (Signed)
    Chronic Care Management Pharmacy Assistant   Name: Vickie Brown  MRN: [id number] DOB: [birth year]  Reason for Encounter: Medication Review   PCP : Dorothyann Peng, MD  Allergies:  No Known Allergies  Medications: Outpatient Encounter Medications as of 01/09/2020  Medication Sig  . ascorbic acid (VITAMIN C) 500 MG tablet Take 500 mg by mouth daily.  Marland Kitchen aspirin 81 MG tablet Take 81 mg by mouth every evening.  Marland Kitchen BIOTIN PO Take by mouth daily.  . Calcium Carbonate (CALCIUM 500 PO) Take by mouth.  . cetirizine (ZYRTEC) 10 MG tablet Take 10 mg by mouth at bedtime.  . Cholecalciferol (VITAMIN D3) 2000 UNITS TABS Take 1 tablet by mouth daily.  . diclofenac sodium (VOLTAREN) 1 % GEL APPLY 2 GRAMS TOPICALLY TO KNEES FOUR TIMES DAILY AS NEEDED  . esomeprazole (NEXIUM) 40 MG capsule Take 40 mg by mouth daily before breakfast.  . fluticasone (FLONASE) 50 MCG/ACT nasal spray Place 1 spray into both nostrils at bedtime.  . Homeopathic Products (OSCILLOCOCCINUM PO) Take by mouth.  Marland Kitchen JANUVIA 100 MG tablet TAKE 1 TABLET(100 MG) BY MOUTH DAILY  . losartan (COZAAR) 50 MG tablet TAKE 1 TABLET(50 MG) BY MOUTH DAILY  . Multiple Vitamins-Minerals (ALIVE ONCE DAILY WOMENS 50+ PO) Take 1 tablet by mouth daily.  . Omega-3 Fatty Acids (FISH OIL PO) Take by mouth.  . pravastatin (PRAVACHOL) 40 MG tablet TAKE 1 TABLET BY MOUTH DAILY  . Probiotic Product (ALIGN) 4 MG CAPS Take 4 mg by mouth daily.  . TURMERIC PO Take by mouth.  . vitamin B-12 (CYANOCOBALAMIN) 500 MCG tablet Take 500 mcg by mouth daily.   No facility-administered encounter medications on file as of 01/09/2020.    Current Diagnosis: Patient Active Problem List   Diagnosis Date Noted  . Type 2 diabetes mellitus with stage 2 chronic kidney disease, without long-term current use of insulin (HCC) 12/22/2017  . Chronic renal disease, stage II 12/22/2017  . Hypertensive nephropathy 12/22/2017  . Dermatitis 12/22/2017  . Dyspnea on exertion  06/22/2010  . Myalgia 06/22/2010     Follow-Up:  Pharmacist Review - Reviewed Chart and adherence measures, Per insurance data, Medication adherence for cholesterol 100% med compliance/ Medication adherence for diabetes 100% med compliance / medication adherence for hypertension 90-99% med compliance .   Cherylin Mylar, CPP Notified  Jon Gills, [facility] Clinical Pharmacist Assistant [phone]

## 2020-01-16 ENCOUNTER — Other Ambulatory Visit: Payer: Medicare Other

## 2020-01-16 ENCOUNTER — Ambulatory Visit: Payer: Medicare Other

## 2020-01-31 DIAGNOSIS — E119 Type 2 diabetes mellitus without complications: Secondary | ICD-10-CM | POA: Diagnosis not present

## 2020-01-31 DIAGNOSIS — E1165 Type 2 diabetes mellitus with hyperglycemia: Secondary | ICD-10-CM | POA: Diagnosis not present

## 2020-02-06 DIAGNOSIS — Z20822 Contact with and (suspected) exposure to covid-19: Secondary | ICD-10-CM | POA: Diagnosis not present

## 2020-02-17 DIAGNOSIS — E1165 Type 2 diabetes mellitus with hyperglycemia: Secondary | ICD-10-CM | POA: Diagnosis not present

## 2020-02-17 DIAGNOSIS — Z8631 Personal history of diabetic foot ulcer: Secondary | ICD-10-CM | POA: Diagnosis not present

## 2020-02-20 ENCOUNTER — Telehealth: Payer: Self-pay

## 2020-02-20 NOTE — Chronic Care Management (AMB) (Signed)
Chronic Care Management Pharmacy Assistant   Name: Vickie Brown  MRN: [id number] DOB: [birth year]  Reason for Encounter: Diabetes Adherence Call  Patient Questions:  1.  Have you seen any other providers since your last visit? Yes, 12/24/19-Sanders, Melina Schools, MD ([facility]).     2.  Any changes in your medicines or health? No    PCP : Dorothyann Peng, MD  Allergies:  No Known Allergies  Medications: Outpatient Encounter Medications as of 02/20/2020  Medication Sig   ascorbic acid (VITAMIN C) 500 MG tablet Take 500 mg by mouth daily.   aspirin 81 MG tablet Take 81 mg by mouth every evening.   BIOTIN PO Take by mouth daily.   Calcium Carbonate (CALCIUM 500 PO) Take by mouth.   cetirizine (ZYRTEC) 10 MG tablet Take 10 mg by mouth at bedtime.   Cholecalciferol (VITAMIN D3) 2000 UNITS TABS Take 1 tablet by mouth daily.   diclofenac sodium (VOLTAREN) 1 % GEL APPLY 2 GRAMS TOPICALLY TO KNEES FOUR TIMES DAILY AS NEEDED   esomeprazole (NEXIUM) 40 MG capsule Take 40 mg by mouth daily before breakfast.   fluticasone (FLONASE) 50 MCG/ACT nasal spray Place 1 spray into both nostrils at bedtime.   Homeopathic Products (OSCILLOCOCCINUM PO) Take by mouth.   JANUVIA 100 MG tablet TAKE 1 TABLET(100 MG) BY MOUTH DAILY   losartan (COZAAR) 50 MG tablet TAKE 1 TABLET(50 MG) BY MOUTH DAILY   Multiple Vitamins-Minerals (ALIVE ONCE DAILY WOMENS 50+ PO) Take 1 tablet by mouth daily.   Omega-3 Fatty Acids (FISH OIL PO) Take by mouth.   pravastatin (PRAVACHOL) 40 MG tablet TAKE 1 TABLET BY MOUTH DAILY   Probiotic Product (ALIGN) 4 MG CAPS Take 4 mg by mouth daily.   TURMERIC PO Take by mouth.   vitamin B-12 (CYANOCOBALAMIN) 500 MCG tablet Take 500 mcg by mouth daily.   No facility-administered encounter medications on file as of 02/20/2020.    Current Diagnosis: Patient Active Problem List   Diagnosis Date Noted   Type 2 diabetes mellitus with stage 2 chronic kidney disease, without  long-term current use of insulin (HCC) 12/22/2017   Chronic renal disease, stage II 12/22/2017   Hypertensive nephropathy 12/22/2017   Dermatitis 12/22/2017   Dyspnea on exertion 06/22/2010   Myalgia 06/22/2010   Recent Relevant Labs: Lab Results  Component Value Date/Time   HGBA1C 6.5 (H) 12/24/2019 04:22 PM   HGBA1C 6.4 (H) 10/03/2019 02:17 PM   MICROALBUR 10 06/21/2019 12:56 PM    Kidney Function Lab Results  Component Value Date/Time   CREATININE 0.97 12/24/2019 04:22 PM   CREATININE 0.86 10/03/2019 02:17 PM   GFRNONAA 55 (L) 12/24/2019 04:22 PM   GFRAA 64 12/24/2019 04:22 PM     Current antihyperglycemic regimen:  ? Januvia 100mg  daily   What recent interventions/DTPs have been made to improve glycemic control:  o Recent interventions was to check blood sugar once daily, document, and provide at future appointments, Contact provider with any episodes of hypoglycemia. o   Have there been any recent hospitalizations or ED visits since last visit with CPP? No    Patient denies hypoglycemic symptoms, including Pale, Sweaty, Shaky, Hungry, Nervous/irritable and Vision changes    Patient denies hyperglycemic symptoms, including blurry vision, excessive thirst, fatigue, polyuria and weakness    How often are you checking your blood sugar? once daily    What are your blood sugars ranging?  o Fasting: 123 am 02/14/20. 117 am 02/15/20. 112 am 02/16/20. 123  am 02/17/20. 122 am 02/18/20. 124 am 02/19/20. 138 am 02/21/20. 109 am 02/22/20. 110 am 02/23/20. 119 am 02/25/20.  o Before meals: none o After meals: none o Bedtime: none   During the week, how often does your blood glucose drop below 70? Never    Are you checking your feet daily/regularly? Patient states she checks her feet with reports of swelling and uncomfortable to deal at night. Patient reports she does elevate her legs once in awhile.  Adherence Review: Is the patient currently on a STATIN medication? Yes Is the  patient currently on ACE/ARB medication? Yes Does the patient have >5 day gap between last estimated fill dates? No   Goals Addressed            This Visit's Progress    Pharmacy Care Plan   On track    CARE PLAN ENTRY (see longitudinal plan of care for additional care plan information)  Current Barriers:   Chronic Disease Management support, education, and care coordination needs related to Hypertension, Hyperlipidemia, and Diabetes   Hypertension BP Readings from Last 3 Encounters:  06/21/19 126/74  06/21/19 126/74  02/14/19 (!) 105/54    Pharmacist Clinical Goal(s): o Over the next 180 days, patient will work with PharmD and providers to maintain BP goal <130/80  Current regimen:  o Losartan 50mg  daily  Interventions: o Provided dietary and exercise recommendations  Patient self care activities - Over the next 180 days, patient will: o Check BP if symptomatic, document, and provide at future appointments o Ensure daily salt intake < 2300 mg/day  Hyperlipidemia Lab Results  Component Value Date/Time   LDLCALC 55 02/14/2019 04:13 PM    Pharmacist Clinical Goal(s): o Over the next 180 days, patient will work with PharmD and providers to maintain LDL goal < 70  Current regimen:  o Pravastatin 40mg  daily  Interventions: o Provided dietary and exercise recommendations  Patient self care activities - Over the next 180 days, patient will: o Exercise for 30 minutes daily 5 times per week (150 minutes per week total) o Eat baked lean meats (chicken, fish)  Diabetes Lab Results  Component Value Date/Time   HGBA1C 6.3 (H) 06/21/2019 12:24 PM   HGBA1C 6.1 (H) 02/14/2019 04:13 PM    Pharmacist Clinical Goal(s): o Over the next 180 days, patient will work with PharmD and providers to maintain A1c goal <7%  Current regimen:  o Januvia 100mg  daily  Interventions: o Provided dietary and exercise recommendations o Discussed appropriate goals for fasting blood  sugar (80-130) o Reviewed cost of Januvia. Affordable for patient.  Patient self care activities - Over the next 180 days, patient will: o Check blood sugar once daily, document, and provide at future appointments o Contact provider with any episodes of hypoglycemia  Health Maintenance  Pharmacist Clinical Goal(s) o Over the next 180 days, patient will work with PharmD and providers to optimize over the counter supplements  Current regimen:   Turmeric daily  Align 4 mg daily   Oscillococcinum daily   Alive multivitamin daily   Fluticasone nasal spray 1 spray both nostrils at bedtime   Biotin daily  Cetirizine 10mg  daily  Calcium daily  Cyanocobalamin 08/21/2019 daily  Vitamin C 500mg  daily  EmergenC 1000mg  daily   Prevagen daily  Tylenol 500mg   Interventions: o Advised patient to only take 1 vitamin C supplement daily (she does not need both Vitamin C and EmergenC daily) o Advised patient to take 1200mg  of calcium daily o Will  review coupon/cost assistance for Prevagen  Patient self care activities - Over the next 180 days, patient will: o Take Vitamin C 500mg  daily and take EmergenC when she feels like she may be getting sick o Take 1200mg  of calcium daily  Medication management  Pharmacist Clinical Goal(s): o Over the next 180 days, patient will work with PharmD and providers to maintain optimal medication adherence  Current pharmacy: [facility]  Interventions o Comprehensive medication review performed. o Continue current medication management strategy  Patient self care activities - Over the next 180 days, patient will: o Focus on medication adherence by using a pill box to organize medications o Take medications as prescribed o Report any questions or concerns to PharmD and/or provider(s)  Initial goal documentation        Follow-Up:  Pharmacist Review-Patient reports she is having pain in her neck and shoulders. Patient stated she is having a  difficult time turning her head. Patient also voiced she is having back pain as well.   , CPP notified.  , CMA Clinical Pharmacist Assistant [phone] CCM Total Time: 19 minutes

## 2020-03-10 ENCOUNTER — Telehealth: Payer: Self-pay

## 2020-03-10 ENCOUNTER — Other Ambulatory Visit: Payer: Self-pay

## 2020-03-10 ENCOUNTER — Telehealth (INDEPENDENT_AMBULATORY_CARE_PROVIDER_SITE_OTHER): Payer: Medicare Other | Admitting: Nurse Practitioner

## 2020-03-10 ENCOUNTER — Encounter: Payer: Self-pay | Admitting: Nurse Practitioner

## 2020-03-10 DIAGNOSIS — R059 Cough, unspecified: Secondary | ICD-10-CM

## 2020-03-10 DIAGNOSIS — J00 Acute nasopharyngitis [common cold]: Secondary | ICD-10-CM | POA: Diagnosis not present

## 2020-03-10 DIAGNOSIS — J069 Acute upper respiratory infection, unspecified: Secondary | ICD-10-CM

## 2020-03-10 MED ORDER — AMOXICILLIN 875 MG PO TABS: 875.0000 mg | ORAL_TABLET | Freq: Two times a day (BID) | ORAL | 0 refills | Status: DC

## 2020-03-10 MED ORDER — IPRATROPIUM BROMIDE 0.03 % NA SOLN: 2.0000 | Freq: Three times a day (TID) | NASAL | 2 refills | Status: DC

## 2020-03-10 MED ORDER — BENZONATATE 100 MG PO CAPS: 100.0000 mg | ORAL_CAPSULE | Freq: Four times a day (QID) | ORAL | 1 refills | Status: DC | PRN

## 2020-03-10 NOTE — Telephone Encounter (Signed)
The pt left a message that she needed an antibiotic for her bad cold.  The pt was asked if she wanted a virtual appt and she said yes.  The pt was also advised that she should take a covid test.

## 2020-03-10 NOTE — Progress Notes (Addendum)
Virtual Visit via Telephone    This visit type was conducted due to national recommendations for restrictions regarding the COVID-19 Pandemic (e.g. social distancing) in an effort to limit this patient's exposure and mitigate transmission in our community.  Due to her co-morbid illnesses, this patient is at least at moderate risk for complications without adequate follow up.  This format is felt to be most appropriate for this patient at this time.  All issues noted in this document were discussed and addressed.  A limited physical exam was performed with this format.    This visit type was conducted due to national recommendations for restrictions regarding the COVID-19 Pandemic (e.g. social distancing) in an effort to limit this patient's exposure and mitigate transmission in our community.  Patients identity confirmed using two different identifiers.  This format is felt to be most appropriate for this patient at this time.  All issues noted in this document were discussed and addressed.  No physical exam was performed (except for noted visual exam findings with Video Visits).    Date:  04/04/2020   ID:  Vickie Brown, Vickie Brown [birth year], MRN [id number]  Patient Location:  Home - spoke with Vickie Brown  Provider location:   Office    Chief Complaint:  cough  History of Present Illness:    Vickie Brown is a 80 y.o. female who presents via video conferencing for a telehealth visit today.    The patient does not have symptoms concerning for COVID-19 infection (fever, chills, cough, or new shortness of breath).   She took dayquil and nyquil an entire box since last week. She has also taken coricidan.    Cough This is a new problem. The current episode started in the past 7 days. The problem has been gradually improving. The problem occurs constantly. The cough is productive of sputum. Associated symptoms include chills. Pertinent negatives include no chest pain, ear congestion,  fever, headaches or nasal congestion. There is no history of asthma.     Past Medical History:  Diagnosis Date  . Chronic headaches   . Diverticulitis   . Hyperlipidemia   . Knee pain   . Type 2 diabetes mellitus (HCC)    Past Surgical History:  Procedure Laterality Date  . Bilateral tubal ligation  1971  . BTL    . PARTIAL HYSTERECTOMY  1973  . TONSILECTOMY, ADENOIDECTOMY, BILATERAL MYRINGOTOMY AND TUBES  1950  . VESICOVAGINAL FISTULA CLOSURE W/ TAH       Current Meds  Medication Sig  . benzonatate (TESSALON PERLES) 100 MG capsule Take 1 capsule (100 mg total) by mouth every 6 (six) hours as needed for cough.  Marland Kitchen ipratropium (ATROVENT) 0.03 % nasal spray Place 2 sprays into the nose 3 (three) times daily. (Patient not taking: Reported on 03/25/2020)  . [DISCONTINUED] amoxicillin (AMOXIL) 875 MG tablet Take 1 tablet (875 mg total) by mouth 2 (two) times daily.     Allergies:   Patient has no known allergies.   Social History   Tobacco Use  . Smoking status: Never Smoker  . Smokeless tobacco: Never Used  Vaping Use  . Vaping Use: Never used  Substance Use Topics  . Alcohol use: Not Currently    Comment: occassionally  . Drug use: No     Family Hx: The patient's family history includes Cancer in her mother; Cervical cancer in her mother; Diabetes in her brother, father, and sister. There is no history of Breast cancer.  ROS:  Please see the history of present illness.    Review of Systems  Constitutional: Positive for chills. Negative for fever.  Respiratory: Positive for cough.   Cardiovascular: Negative for chest pain.  Neurological: Negative for dizziness and headaches.  Psychiatric/Behavioral: Negative.     All other systems reviewed and are negative.   Labs/Other Tests and Data Reviewed:    Recent Labs: 10/03/2019: Hemoglobin 12.7; Platelets 250 03/25/2020: ALT 21; BUN 5; Creatinine, Ser 0.93; Potassium 4.2; Sodium 140; TSH 2.140   Recent Lipid  Panel Lab Results  Component Value Date/Time   CHOL 143 02/14/2019 04:13 PM   TRIG 90 02/14/2019 04:13 PM   HDL 71 02/14/2019 04:13 PM   CHOLHDL 2.0 02/14/2019 04:13 PM   CHOLHDL 2.0 07/09/2010 04:11 AM   LDLCALC 55 02/14/2019 04:13 PM    Wt Readings from Last 3 Encounters:  03/25/20 154 lb (69.9 kg)  12/24/19 156 lb 12.8 oz (71.1 kg)  10/03/19 152 lb 9.6 oz (69.2 kg)     Exam:    Vital Signs:  There were no vitals taken for this visit.    Physical Exam Vitals reviewed.  Constitutional:      General: She is not in acute distress.    Appearance: Normal appearance.  Cardiovascular:     Rate and Rhythm: Normal rate and regular rhythm.     Pulses: Normal pulses.     Heart sounds: Normal heart sounds. No murmur heard.   Neurological:     Mental Status: She is alert.     ASSESSMENT & PLAN:    1. Cough . This has been more persistent will treat with tessalon perles due to age and wanting to avoid narcotics - benzonatate (TESSALON PERLES) 100 MG capsule; Take 1 capsule (100 mg total) by mouth every 6 (six) hours as needed for cough.  Dispense: 30 capsule; Refill: 1  2. Acute rhinitis .  - ipratropium (ATROVENT) 0.03 % nasal spray; Place 2 sprays into the nose 3 (three) times daily. (Patient not taking: Reported on 03/25/2020)  Dispense: 30 mL; Refill: 2  3. Upper respiratory tract infection, unspecified type . She is less than 7 days with symptoms will treat symptoms if not better at the end of the week she is to return call to office.     COVID-19 Education: The signs and symptoms of COVID-19 were discussed with the patient and how to seek care for testing (follow up with PCP or arrange E-visit).  The importance of social distancing was discussed today.  Patient Risk:   After full review of this patients clinical status, I feel that they are at least moderate risk at this time.  Time:   Today, I have spent 11 minutes/ seconds with the patient with telehealth  technology discussing above diagnoses.     Medication Adjustments/Labs and Tests Ordered: Current medicines are reviewed at length with the patient today.  Concerns regarding medicines are outlined above.   Tests Ordered: No orders of the defined types were placed in this encounter.   Medication Changes: Meds ordered this encounter  Medications  . DISCONTD: amoxicillin (AMOXIL) 875 MG tablet    Sig: Take 1 tablet (875 mg total) by mouth 2 (two) times daily.    Dispense:  14 tablet    Refill:  0  . benzonatate (TESSALON PERLES) 100 MG capsule    Sig: Take 1 capsule (100 mg total) by mouth every 6 (six) hours as needed for cough.    Dispense:  30  capsule    Refill:  1  . ipratropium (ATROVENT) 0.03 % nasal spray    Sig: Place 2 sprays into the nose 3 (three) times daily.    Dispense:  30 mL    Refill:  2    Disposition:  Follow up prn  Signed, Arnette Felts, FNP

## 2020-03-10 NOTE — Patient Instructions (Signed)

## 2020-03-25 ENCOUNTER — Encounter: Payer: Self-pay | Admitting: Internal Medicine

## 2020-03-25 ENCOUNTER — Ambulatory Visit (INDEPENDENT_AMBULATORY_CARE_PROVIDER_SITE_OTHER): Payer: Medicare Other | Admitting: Internal Medicine

## 2020-03-25 ENCOUNTER — Other Ambulatory Visit: Payer: Self-pay

## 2020-03-25 VITALS — BP 112/76 | HR 68 | Temp 98.0°F | Ht 59.0 in | Wt 154.0 lb

## 2020-03-25 DIAGNOSIS — N182 Chronic kidney disease, stage 2 (mild): Secondary | ICD-10-CM | POA: Diagnosis not present

## 2020-03-25 DIAGNOSIS — I700 Atherosclerosis of aorta: Secondary | ICD-10-CM

## 2020-03-25 DIAGNOSIS — I129 Hypertensive chronic kidney disease with stage 1 through stage 4 chronic kidney disease, or unspecified chronic kidney disease: Secondary | ICD-10-CM | POA: Diagnosis not present

## 2020-03-25 DIAGNOSIS — E1122 Type 2 diabetes mellitus with diabetic chronic kidney disease: Secondary | ICD-10-CM

## 2020-03-25 DIAGNOSIS — E6609 Other obesity due to excess calories: Secondary | ICD-10-CM

## 2020-03-25 DIAGNOSIS — Z6831 Body mass index (BMI) 31.0-31.9, adult: Secondary | ICD-10-CM

## 2020-03-25 NOTE — Patient Instructions (Signed)
Diabetes Mellitus and Foot Care Foot care is an important part of your health, especially when you have diabetes. Diabetes may cause you to have problems because of poor blood flow (circulation) to your feet and legs, which can cause your skin to:  Become thinner and drier.  Break more easily.  Heal more slowly.  Peel and crack. You may also have nerve damage (neuropathy) in your legs and feet, causing decreased feeling in them. This means that you may not notice minor injuries to your feet that could lead to more serious problems. Noticing and addressing any potential problems early is the best way to prevent future foot problems. How to care for your feet Foot hygiene  Wash your feet daily with warm water and mild soap. Do not use hot water. Then, pat your feet and the areas between your toes until they are completely dry. Do not soak your feet as this can dry your skin.  Trim your toenails straight across. Do not dig under them or around the cuticle. File the edges of your nails with an emery board or nail file.  Apply a moisturizing lotion or petroleum jelly to the skin on your feet and to dry, brittle toenails. Use lotion that does not contain alcohol and is unscented. Do not apply lotion between your toes.   Shoes and socks  Wear clean socks or stockings every day. Make sure they are not too tight. Do not wear knee-high stockings since they may decrease blood flow to your legs.  Wear shoes that fit properly and have enough cushioning. Always look in your shoes before you put them on to be sure there are no objects inside.  To break in new shoes, wear them for just a few hours a day. This prevents injuries on your feet. Wounds, scrapes, corns, and calluses  Check your feet daily for blisters, cuts, bruises, sores, and redness. If you cannot see the bottom of your feet, use a mirror or ask someone for help.  Do not cut corns or calluses or try to remove them with medicine.  If you  find a minor scrape, cut, or break in the skin on your feet, keep it and the skin around it clean and dry. You may clean these areas with mild soap and water. Do not clean the area with peroxide, alcohol, or iodine.  If you have a wound, scrape, corn, or callus on your foot, look at it several times a day to make sure it is healing and not infected. Check for: ? Redness, swelling, or pain. ? Fluid or blood. ? Warmth. ? Pus or a bad smell.   General tips  Do not cross your legs. This may decrease blood flow to your feet.  Do not use heating pads or hot water bottles on your feet. They may burn your skin. If you have lost feeling in your feet or legs, you may not know this is happening until it is too late.  Protect your feet from hot and cold by wearing shoes, such as at the beach or on hot pavement.  Schedule a complete foot exam at least once a year (annually) or more often if you have foot problems. Report any cuts, sores, or bruises to your health care provider immediately. Where to find more information  American Diabetes Association: www.diabetes.org  Association of Diabetes Care & Education Specialists: www.diabeteseducator.org Contact a health care provider if:  You have a medical condition that increases your risk of infection and   you have any cuts, sores, or bruises on your feet.  You have an injury that is not healing.  You have redness on your legs or feet.  You feel burning or tingling in your legs or feet.  You have pain or cramps in your legs and feet.  Your legs or feet are numb.  Your feet always feel cold.  You have pain around any toenails. Get help right away if:  You have a wound, scrape, corn, or callus on your foot and: ? You have pain, swelling, or redness that gets worse. ? You have fluid or blood coming from the wound, scrape, corn, or callus. ? Your wound, scrape, corn, or callus feels warm to the touch. ? You have pus or a bad smell coming from  the wound, scrape, corn, or callus. ? You have a fever. ? You have a red line going up your leg. Summary  Check your feet every day for blisters, cuts, bruises, sores, and redness.  Apply a moisturizing lotion or petroleum jelly to the skin on your feet and to dry, brittle toenails.  Wear shoes that fit properly and have enough cushioning.  If you have foot problems, report any cuts, sores, or bruises to your health care provider immediately.  Schedule a complete foot exam at least once a year (annually) or more often if you have foot problems. This information is not intended to replace advice given to you by your health care provider. Make sure you discuss any questions you have with your health care provider. Document Revised: 07/19/2019 Document Reviewed: 07/19/2019 Elsevier Patient Education  2021 [employer]  

## 2020-03-25 NOTE — Progress Notes (Signed)
I,Katawbba Wiggins,acting as a Education administrator for Maximino Greenland, MD.,have documented all relevant documentation on the behalf of Maximino Greenland, MD,as directed by  Maximino Greenland, MD while in the presence of Maximino Greenland, MD.  This visit occurred during the SARS-CoV-2 public health emergency.  Safety protocols were in place, including screening questions prior to the visit, additional usage of staff PPE, and extensive cleaning of exam room while observing appropriate contact time as indicated for disinfecting solutions.  Subjective:     Patient ID: Vickie Brown , female    DOB: 20-Oct-1940 , 80 y.o.   MRN: [id number]   Chief Complaint  Patient presents with  . Diabetes  . Hypertension    HPI  She is here today for a diabetes and BP check.  She reports her sugars are usually 110-125.  She reports compliance with meds. She denies chest pain, shortness of breath and palpitations.   Diabetes She presents for her follow-up diabetic visit. She has type 2 diabetes mellitus. Her disease course has been stable. There are no hypoglycemic associated symptoms. Pertinent negatives for hypoglycemia include no headaches. Pertinent negatives for diabetes include no blurred vision and no chest pain. There are no hypoglycemic complications. Risk factors for coronary artery disease include diabetes mellitus, dyslipidemia, hypertension and post-menopausal. She is following a diabetic diet. Her breakfast blood glucose is taken between 8-9 am. Her breakfast blood glucose range is generally 90-110 mg/dl.  Hypertension This is a chronic problem. The current episode started more than 1 year ago. The problem has been gradually improving since onset. Pertinent negatives include no blurred vision, chest pain, headaches, palpitations or shortness of breath.     Past Medical History:  Diagnosis Date  . Chronic headaches   . Diverticulitis   . Hyperlipidemia   . Knee pain   . Type 2 diabetes mellitus (HCC)       Family History  Problem Relation Age of Onset  . Cervical cancer Mother   . Cancer Mother   . Diabetes Father   . Diabetes Sister   . Diabetes Brother   . Breast cancer Neg Hx      Current Outpatient Medications:  .  ascorbic acid (VITAMIN C) 500 MG tablet, Take 500 mg by mouth daily., Disp: , Rfl:  .  aspirin 81 MG tablet, Take 81 mg by mouth every evening., Disp: , Rfl:  .  benzonatate (TESSALON PERLES) 100 MG capsule, Take 1 capsule (100 mg total) by mouth every 6 (six) hours as needed for cough., Disp: 30 capsule, Rfl: 1 .  BIOTIN PO, Take by mouth daily., Disp: , Rfl:  .  Calcium Carbonate (CALCIUM 500 PO), Take by mouth., Disp: , Rfl:  .  cetirizine (ZYRTEC) 10 MG tablet, Take 10 mg by mouth at bedtime., Disp: , Rfl:  .  Cholecalciferol (VITAMIN D3) 2000 UNITS TABS, Take 1 tablet by mouth daily., Disp: , Rfl:  .  diclofenac sodium (VOLTAREN) 1 % GEL, APPLY 2 GRAMS TOPICALLY TO KNEES FOUR TIMES DAILY AS NEEDED, Disp: 100 g, Rfl: 1 .  fluticasone (FLONASE) 50 MCG/ACT nasal spray, Place 1 spray into both nostrils at bedtime., Disp: 16 g, Rfl: 2 .  Homeopathic Products (OSCILLOCOCCINUM PO), Take by mouth., Disp: , Rfl:  .  JANUVIA 100 MG tablet, TAKE 1 TABLET(100 MG) BY MOUTH DAILY, Disp: 90 tablet, Rfl: 1 .  losartan (COZAAR) 50 MG tablet, TAKE 1 TABLET(50 MG) BY MOUTH DAILY, Disp: 90 tablet, Rfl: 1 .  Multiple Vitamins-Minerals (ALIVE ONCE DAILY WOMENS 50+ PO), Take 1 tablet by mouth daily., Disp: , Rfl:  .  Omega-3 Fatty Acids (FISH OIL PO), Take by mouth., Disp: , Rfl:  .  pravastatin (PRAVACHOL) 40 MG tablet, TAKE 1 TABLET BY MOUTH DAILY, Disp: 90 tablet, Rfl: 1 .  Probiotic Product (ALIGN) 4 MG CAPS, Take 4 mg by mouth daily., Disp: , Rfl:  .  TURMERIC PO, Take by mouth., Disp: , Rfl:  .  vitamin B-12 (CYANOCOBALAMIN) 500 MCG tablet, Take 500 mcg by mouth daily., Disp: , Rfl:  .  esomeprazole (NEXIUM) 40 MG capsule, Take 40 mg by mouth daily before breakfast. (Patient not  taking: Reported on 03/25/2020), Disp: , Rfl:  .  ipratropium (ATROVENT) 0.03 % nasal spray, Place 2 sprays into the nose 3 (three) times daily. (Patient not taking: Reported on 03/25/2020), Disp: 30 mL, Rfl: 2   No Known Allergies   Review of Systems  Constitutional: Negative.   Eyes: Negative for blurred vision.  Respiratory: Negative.  Negative for shortness of breath.   Cardiovascular: Negative.  Negative for chest pain and palpitations.  Gastrointestinal: Negative.   Neurological: Negative for headaches.  Psychiatric/Behavioral: Negative.   All other systems reviewed and are negative.    Today's Vitals   03/25/20 1148  BP: 112/76  Pulse: 68  Temp: 98 F (36.7 C)  TempSrc: Oral  Weight: 154 lb (69.9 kg)  Height: 4' 11" (1.499 m)  PainSc: 5   PainLoc: Knee   Body mass index is 31.1 kg/m.  Wt Readings from Last 3 Encounters:  03/25/20 154 lb (69.9 kg)  12/24/19 156 lb 12.8 oz (71.1 kg)  10/03/19 152 lb 9.6 oz (69.2 kg)   Objective:  Physical Exam Vitals and nursing note reviewed.  Constitutional:      Appearance: Normal appearance.  HENT:     Head: Normocephalic and atraumatic.     Mouth/Throat:     Comments: Masked  Eyes:     Comments: Masked   Cardiovascular:     Rate and Rhythm: Normal rate and regular rhythm.     Heart sounds: Normal heart sounds.  Pulmonary:     Breath sounds: Normal breath sounds.  Musculoskeletal:     Cervical back: Normal range of motion.  Skin:    General: Skin is warm.  Neurological:     General: No focal deficit present.     Mental Status: She is alert and oriented to person, place, and time.         Assessment And Plan:     1. Type 2 diabetes mellitus with stage 2 chronic kidney disease, without long-term current use of insulin (HCC) Comments: Chronic, I will check labs as listed below. She is encouraged to incorporate more exercise into her daily routine. Advised to avoid sugary beverages.  - Hemoglobin A1c -  CMP14+EGFR  2. Hypertensive nephropathy Comments: Well controlled. She will c/w losartan 31mdaily. Advised to use salt free seasonings .Also advised to avoid adding salt to her foods. - TSH  3. Atherosclerosis of aorta (HCC) Comments: Chronic. She is enocuraged to follow a heart healthy lifestyle. Advised to take statin daily, avoid fried food and increase daily activity.   4. Class 1 obesity due to excess calories with serious comorbidity and body mass index (BMI) of 31.0 to 31.9 in adult  She is encouraged to strive for BMI less than 29 to decrease cardiac risk. Advised to aim for at least 150 minutes of exercise  per week.  Patient was given opportunity to ask questions. Patient verbalized understanding of the plan and was able to repeat key elements of the plan. All questions were answered to their satisfaction.   I, Maximino Greenland, MD, have reviewed all documentation for this visit. The documentation on 03/25/20 for the exam, diagnosis, procedures, and orders are all accurate and complete.   IF YOU HAVE BEEN REFERRED TO A SPECIALIST, IT MAY TAKE 1-2 WEEKS TO SCHEDULE/PROCESS THE REFERRAL. IF YOU HAVE NOT HEARD FROM US/SPECIALIST IN TWO WEEKS, PLEASE GIVE [place] A CALL AT [phone].   THE PATIENT IS ENCOURAGED TO PRACTICE SOCIAL DISTANCING DUE TO THE COVID-19 PANDEMIC.

## 2020-03-26 LAB — CMP14+EGFR
ALT: 21 IU/L (ref 0–32)
AST: 29 IU/L (ref 0–40)
Albumin/Globulin Ratio: 1.6 (ref 1.2–2.2)
Albumin: 4.1 g/dL (ref 3.6–4.6)
Alkaline Phosphatase: 57 IU/L (ref 44–121)
BUN/Creatinine Ratio: 5 — ABNORMAL LOW (ref 12–28)
BUN: 5 mg/dL — ABNORMAL LOW (ref 8–27)
Bilirubin Total: 0.3 mg/dL (ref 0.0–1.2)
CO2: 25 mmol/L (ref 20–29)
Calcium: 9.4 mg/dL (ref 8.7–10.3)
Chloride: 99 mmol/L (ref 96–106)
Creatinine, Ser: 0.93 mg/dL (ref 0.57–1.00)
Globulin, Total: 2.5 g/dL (ref 1.5–4.5)
Glucose: 120 mg/dL — ABNORMAL HIGH (ref 65–99)
Potassium: 4.2 mmol/L (ref 3.5–5.2)
Sodium: 140 mmol/L (ref 134–144)
Total Protein: 6.6 g/dL (ref 6.0–8.5)
eGFR: 62 mL/min/{1.73_m2} (ref >59)

## 2020-03-26 LAB — TSH: TSH: 2.14 u[IU]/mL (ref 0.450–4.500)

## 2020-03-26 LAB — HEMOGLOBIN A1C
Est. average glucose Bld gHb Est-mCnc: 143 mg/dL
Hgb A1c MFr Bld: 6.6 % — ABNORMAL HIGH (ref 4.8–5.6)

## 2020-04-03 ENCOUNTER — Other Ambulatory Visit: Payer: Self-pay | Admitting: Internal Medicine

## 2020-04-30 ENCOUNTER — Inpatient Hospital Stay: Admission: RE | Admit: 2020-04-30 | Payer: Medicare Other | Source: Ambulatory Visit

## 2020-04-30 ENCOUNTER — Other Ambulatory Visit: Payer: Medicare Other

## 2020-05-15 ENCOUNTER — Telehealth: Payer: Self-pay

## 2020-05-15 NOTE — Chronic Care Management (AMB) (Signed)
Chronic Care Management Pharmacy Assistant   Name: Vickie Brown  MRN: [id number] DOB: Nov 21, 1940   Reason for Encounter: Disease State/ Diabetes    Recent office visits:  03-10-2020 Arnette Felts, FNP (PCP)  START Amoxicillin 875mg  2 tablets daily for 7 days. START benzonatate 100mg  every 6 hours as needed for cough. START atrovent 0.03% nasal spray 2 sprays into nose 3 times daily.  03-25-2020 , MD. STOP Amoxicillin.   Recent consult visits:  None  Hospital visits:  None in previous 6 months  Medications: Outpatient Encounter Medications as of 05/15/2020  Medication Sig  . ascorbic acid (VITAMIN C) 500 MG tablet Take 500 mg by mouth daily.  Dorothyann Peng aspirin 81 MG tablet Take 81 mg by mouth every evening.  . benzonatate (TESSALON PERLES) 100 MG capsule Take 1 capsule (100 mg total) by mouth every 6 (six) hours as needed for cough.  07/15/2020 BIOTIN PO Take by mouth daily.  . Calcium Carbonate (CALCIUM 500 PO) Take by mouth.  . cetirizine (ZYRTEC) 10 MG tablet Take 10 mg by mouth at bedtime.  . Cholecalciferol (VITAMIN D3) 2000 UNITS TABS Take 1 tablet by mouth daily.  . diclofenac sodium (VOLTAREN) 1 % GEL APPLY 2 GRAMS TOPICALLY TO KNEES FOUR TIMES DAILY AS NEEDED  . esomeprazole (NEXIUM) 40 MG capsule Take 40 mg by mouth daily before breakfast. (Patient not taking: Reported on 03/25/2020)  . fluticasone (FLONASE) 50 MCG/ACT nasal spray Place 1 spray into both nostrils at bedtime.  . Homeopathic Products (OSCILLOCOCCINUM PO) Take by mouth.  Marland Kitchen ipratropium (ATROVENT) 0.03 % nasal spray Place 2 sprays into the nose 3 (three) times daily. (Patient not taking: Reported on 03/25/2020)  . JANUVIA 100 MG tablet TAKE 1 TABLET(100 MG) BY MOUTH DAILY  . losartan (COZAAR) 50 MG tablet TAKE 1 TABLET(50 MG) BY MOUTH DAILY  . Multiple Vitamins-Minerals (ALIVE ONCE DAILY WOMENS 50+ PO) Take 1 tablet by mouth daily.  . Omega-3 Fatty Acids (FISH OIL PO) Take by mouth.  . pravastatin  (PRAVACHOL) 40 MG tablet TAKE 1 TABLET BY MOUTH DAILY  . Probiotic Product (ALIGN) 4 MG CAPS Take 4 mg by mouth daily.  . TURMERIC PO Take by mouth.  . vitamin B-12 (CYANOCOBALAMIN) 500 MCG tablet Take 500 mcg by mouth daily.   No facility-administered encounter medications on file as of 05/15/2020.   Recent Relevant Labs: Lab Results  Component Value Date/Time   HGBA1C 6.6 (H) 03/25/2020 12:24 PM   HGBA1C 6.5 (H) 12/24/2019 04:22 PM   MICROALBUR 10 06/21/2019 12:56 PM    Kidney Function Lab Results  Component Value Date/Time   CREATININE 0.93 03/25/2020 12:24 PM   CREATININE 0.97 12/24/2019 04:22 PM   GFRNONAA 55 (L) 12/24/2019 04:22 PM   GFRAA 64 12/24/2019 04:22 PM    . Current antihyperglycemic regimen:  o Januvia 100mg  daily  . What recent interventions/DTPs have been made to improve glycemic control:  o Patient states she is taking all medications as directed.  . Have there been any recent hospitalizations or ED visits since last visit with CPP? No   . Patient denies hypoglycemic symptoms  . Patient denies hyperglycemic symptoms  . How often are you checking your blood sugar? once daily . What are your blood sugars ranging?  o Fasting: None o Before meals: 143, 134 15, 124, 138, 134, 137, 114, 118, 106 o After meals: None o Bedtime: None  . During the week, how often does your blood glucose drop below  70? Never . Are you checking your feet daily/regularly? Patient states daily  Adherence Review: Is the patient currently on a STATIN medication? Yes Is the patient currently on ACE/ARB medication? Yes Does the patient have >5 day gap between last estimated fill dates? Yes  NOTE: Patient stated her feet and ankles are swollen. Patient stated that Dr. Allyne Gee is aware of the edema. Sent scheduling a message to schedule patient with a follow up with Cherylin Mylar on 07-09-20 at 3:00.  Star Rating Drugs: Januvia 100mg - Last filled 01-07-2020 [facility]  [facility] Pravastatin 40mg - Last filled 01-07-2020 [facility] Losartan 50mg - Last filled 01-07-2020 [facility]    01-09-2020 [facility]  [phone]

## 2020-05-19 DIAGNOSIS — E119 Type 2 diabetes mellitus without complications: Secondary | ICD-10-CM | POA: Diagnosis not present

## 2020-05-19 DIAGNOSIS — E1165 Type 2 diabetes mellitus with hyperglycemia: Secondary | ICD-10-CM | POA: Diagnosis not present

## 2020-05-27 ENCOUNTER — Encounter: Payer: Self-pay | Admitting: Internal Medicine

## 2020-06-05 ENCOUNTER — Ambulatory Visit
Admission: RE | Admit: 2020-06-05 | Discharge: 2020-06-05 | Disposition: A | Discharge status: Home / Self Care | Payer: Medicare Other | Source: Ambulatory Visit | Attending: Internal Medicine | Admitting: Internal Medicine

## 2020-06-05 ENCOUNTER — Other Ambulatory Visit: Payer: Self-pay

## 2020-06-05 DIAGNOSIS — Z780 Asymptomatic menopausal state: Secondary | ICD-10-CM | POA: Diagnosis not present

## 2020-06-05 DIAGNOSIS — M810 Age-related osteoporosis without current pathological fracture: Secondary | ICD-10-CM | POA: Diagnosis not present

## 2020-06-05 DIAGNOSIS — E2839 Other primary ovarian failure: Secondary | ICD-10-CM

## 2020-06-12 ENCOUNTER — Other Ambulatory Visit: Payer: Self-pay

## 2020-06-15 ENCOUNTER — Other Ambulatory Visit: Payer: Self-pay | Admitting: Internal Medicine

## 2020-06-15 DIAGNOSIS — M816 Localized osteoporosis [Lequesne]: Secondary | ICD-10-CM

## 2020-06-25 ENCOUNTER — Ambulatory Visit (INDEPENDENT_AMBULATORY_CARE_PROVIDER_SITE_OTHER): Payer: Medicare Other | Admitting: Internal Medicine

## 2020-06-25 ENCOUNTER — Ambulatory Visit (INDEPENDENT_AMBULATORY_CARE_PROVIDER_SITE_OTHER): Payer: Medicare Other

## 2020-06-25 ENCOUNTER — Encounter: Payer: Self-pay | Admitting: Internal Medicine

## 2020-06-25 ENCOUNTER — Other Ambulatory Visit: Payer: Self-pay

## 2020-06-25 VITALS — BP 122/62 | HR 80 | Temp 97.8°F | Ht 59.4 in | Wt 151.6 lb

## 2020-06-25 DIAGNOSIS — E66811 Obesity, class 1: Secondary | ICD-10-CM

## 2020-06-25 DIAGNOSIS — N182 Chronic kidney disease, stage 2 (mild): Secondary | ICD-10-CM | POA: Diagnosis not present

## 2020-06-25 DIAGNOSIS — E1122 Type 2 diabetes mellitus with diabetic chronic kidney disease: Secondary | ICD-10-CM

## 2020-06-25 DIAGNOSIS — M816 Localized osteoporosis [Lequesne]: Secondary | ICD-10-CM

## 2020-06-25 DIAGNOSIS — R0600 Dyspnea, unspecified: Secondary | ICD-10-CM | POA: Diagnosis not present

## 2020-06-25 DIAGNOSIS — M25562 Pain in left knee: Secondary | ICD-10-CM

## 2020-06-25 DIAGNOSIS — I129 Hypertensive chronic kidney disease with stage 1 through stage 4 chronic kidney disease, or unspecified chronic kidney disease: Secondary | ICD-10-CM | POA: Diagnosis not present

## 2020-06-25 DIAGNOSIS — R0609 Other forms of dyspnea: Secondary | ICD-10-CM

## 2020-06-25 DIAGNOSIS — M25561 Pain in right knee: Secondary | ICD-10-CM

## 2020-06-25 DIAGNOSIS — Z0000 Encounter for general adult medical examination without abnormal findings: Secondary | ICD-10-CM | POA: Diagnosis not present

## 2020-06-25 DIAGNOSIS — Z6830 Body mass index (BMI) 30.0-30.9, adult: Secondary | ICD-10-CM

## 2020-06-25 DIAGNOSIS — E6609 Other obesity due to excess calories: Secondary | ICD-10-CM

## 2020-06-25 LAB — POCT UA - MICROALBUMIN
Albumin/Creatinine Ratio, Urine, POC: <30
Creatinine, POC: 200 mg/dL
Microalbumin Ur, POC: 10 mg/L

## 2020-06-25 NOTE — Progress Notes (Signed)
I,Katawbba Wiggins,acting as a Education administrator for Maximino Greenland, MD.,have documented all relevant documentation on the behalf of Maximino Greenland, MD,as directed by  Maximino Greenland, MD while in the presence of Maximino Greenland, MD.  This visit occurred during the SARS-CoV-2 public health emergency.  Safety protocols were in place, including screening questions prior to the visit, additional usage of staff PPE, and extensive cleaning of exam room while observing appropriate contact time as indicated for disinfecting solutions.  Subjective:     Patient ID: Vickie Brown , female    DOB: 08-Jun-1940 , 80 y.o.   MRN: [id number]   Chief Complaint  Patient presents with   Hypertension   Diabetes    HPI  She is here today for a diabetes and BP check.  She reports compliance with meds. She denies headaches, chest pain and palpitations. She is also scheduled for AWV with [facility] advisor.   Hypertension This is a chronic problem. The current episode started more than 1 year ago. The problem has been gradually improving since onset. Associated symptoms include shortness of breath. Pertinent negatives include no blurred vision, chest pain, headaches or palpitations.  Diabetes She presents for her follow-up diabetic visit. She has type 2 diabetes mellitus. Her disease course has been stable. There are no hypoglycemic associated symptoms. Pertinent negatives for hypoglycemia include no headaches. Pertinent negatives for diabetes include no blurred vision and no chest pain. There are no hypoglycemic complications. Risk factors for coronary artery disease include diabetes mellitus, dyslipidemia, hypertension and post-menopausal. She is following a diabetic diet. Her breakfast blood glucose is taken between 8-9 am. Her breakfast blood glucose range is generally 90-110 mg/dl.    Past Medical History:  Diagnosis Date   Chronic headaches    Diverticulitis    Hyperlipidemia    Knee pain    Type 2 diabetes mellitus  ([place])      Family History  Problem Relation Age of Onset   Cervical cancer Mother    Cancer Mother    Diabetes Father    Diabetes Sister    Diabetes Brother    Breast cancer Neg Hx      Current Outpatient Medications:    ascorbic acid (VITAMIN C) 500 MG tablet, Take 500 mg by mouth daily., Disp: , Rfl:    aspirin 81 MG tablet, Take 81 mg by mouth every evening., Disp: , Rfl:    benzonatate (TESSALON PERLES) 100 MG capsule, Take 1 capsule (100 mg total) by mouth every 6 (six) hours as needed for cough., Disp: 30 capsule, Rfl: 1   BIOTIN PO, Take by mouth daily., Disp: , Rfl:    Calcium Carbonate (CALCIUM 500 PO), Take by mouth., Disp: , Rfl:    cetirizine (ZYRTEC) 10 MG tablet, Take 10 mg by mouth at bedtime., Disp: , Rfl:    Cholecalciferol (VITAMIN D3) 2000 UNITS TABS, Take 1 tablet by mouth daily., Disp: , Rfl:    diclofenac sodium (VOLTAREN) 1 % GEL, APPLY 2 GRAMS TOPICALLY TO KNEES FOUR TIMES DAILY AS NEEDED, Disp: 100 g, Rfl: 1   esomeprazole (NEXIUM) 40 MG capsule, Take 40 mg by mouth daily before breakfast. (Patient not taking: No sig reported), Disp: , Rfl:    fluticasone (FLONASE) 50 MCG/ACT nasal spray, Place 1 spray into both nostrils at bedtime., Disp: 16 g, Rfl: 2   Homeopathic Products (OSCILLOCOCCINUM PO), Take by mouth., Disp: , Rfl:    ipratropium (ATROVENT) 0.03 % nasal spray, Place 2 sprays into the  nose 3 (three) times daily. (Patient not taking: No sig reported), Disp: 30 mL, Rfl: 2   JANUVIA 100 MG tablet, TAKE 1 TABLET(100 MG) BY MOUTH DAILY, Disp: 90 tablet, Rfl: 1   losartan (COZAAR) 50 MG tablet, TAKE 1 TABLET(50 MG) BY MOUTH DAILY, Disp: 90 tablet, Rfl: 1   Multiple Vitamins-Minerals (ALIVE ONCE DAILY WOMENS 50+ PO), Take 1 tablet by mouth daily., Disp: , Rfl:    Omega-3 Fatty Acids (FISH OIL PO), Take by mouth., Disp: , Rfl:    pravastatin (PRAVACHOL) 40 MG tablet, TAKE 1 TABLET BY MOUTH DAILY, Disp: 90 tablet, Rfl: 1   Probiotic Product (ALIGN) 4 MG CAPS,  Take 4 mg by mouth daily., Disp: , Rfl:    TURMERIC PO, Take by mouth., Disp: , Rfl:    vitamin B-12 (CYANOCOBALAMIN) 500 MCG tablet, Take 500 mcg by mouth daily., Disp: , Rfl:    No Known Allergies   Review of Systems  Constitutional: Negative.   Eyes:  Negative for blurred vision.  Respiratory:  Positive for shortness of breath.        She reports having sob w/ exertion. She is not SOB while walking around in her home. Occurs when walking in [employer] and other places when she walks long distances. She denies this as a new finding. Of note, she has noticed increased LE edema.   Cardiovascular: Negative.  Negative for chest pain and palpitations.  Gastrointestinal: Negative.   Musculoskeletal:  Positive for arthralgias.       She c/o b/l knee pain. Denies fall/trauma. Her sx have worsened over the past several months. There is pain with ambulation after being seated for long periods of time.   Neurological:  Negative for headaches.  Psychiatric/Behavioral: Negative.    All other systems reviewed and are negative.   Today's Vitals   06/25/20 1048  BP: 122/62  Pulse: 80  Temp: 97.8 F (36.6 C)  TempSrc: Oral  Weight: 151 lb 9.6 oz (68.8 kg)  Height: 4' 11.4" (1.509 m)   Body mass index is 30.21 kg/m.  Wt Readings from Last 3 Encounters:  06/25/20 151 lb 9.6 oz (68.8 kg)  06/25/20 151 lb 9.6 oz (68.8 kg)  03/25/20 154 lb (69.9 kg)    BP Readings from Last 3 Encounters:  06/25/20 122/62  06/25/20 122/62  03/25/20 112/76    Objective:  Physical Exam Vitals and nursing note reviewed.  Constitutional:      Appearance: Normal appearance.  HENT:     Head: Normocephalic and atraumatic.     Nose:     Comments: Masked     Mouth/Throat:     Comments: Masked  Cardiovascular:     Rate and Rhythm: Normal rate and regular rhythm.     Heart sounds: Normal heart sounds.  Pulmonary:     Effort: Pulmonary effort is normal.     Breath sounds: Normal breath sounds.  Musculoskeletal:      Cervical back: Normal range of motion.     Right lower leg: Edema present.     Left lower leg: Edema present.  Skin:    General: Skin is warm.  Neurological:     General: No focal deficit present.     Mental Status: She is alert.  Psychiatric:        Mood and Affect: Mood normal.        Behavior: Behavior normal.        Assessment And Plan:     1. Hypertensive nephropathy  Comments: Chronic, well controlled. She is encouraged to follow low sodium diet.   2. Type 2 diabetes mellitus with stage 2 chronic kidney disease, without long-term current use of insulin (HCC) Comments: Chronic. I will check labs as listed below. She states she is scheduled for f/u exam in August 2022. - BMP8+EGFR - Hemoglobin A1c  3. Localized osteoporosis without current pathological fracture Comments: Pt has upcoming appt with Rheumatology. She is encouraged to walk 20-30 minutes four to five days per week.   4. Dyspnea on exertion Comments: I will refer her for 2D echo and Cardiology evaluation. She is in agreement with her treatment plan.  - ECHOCARDIOGRAM COMPLETE; Future - Ambulatory referral to Cardiology  5. Acute pain of both knees Comments: She has h/o osteoarthritis. She has had improvement with injections in the past. Advised to apply Voltaren gel to affected area TID.   6. Class 1 obesity due to excess calories with serious comorbidity and body mass index (BMI) of 30.0 to 30.9 in adult Comments: Her BMI is acceptable for her demographic. She is encouraged to gradually increase her daily activity as tolerated.   Patient was given opportunity to ask questions. Patient verbalized understanding of the plan and was able to repeat key elements of the plan. All questions were answered to their satisfaction.   I, Maximino Greenland, MD, have reviewed all documentation for this visit. The documentation on 06/28/20 for the exam, diagnosis, procedures, and orders are all accurate and complete.   IF  YOU HAVE BEEN REFERRED TO A SPECIALIST, IT MAY TAKE 1-2 WEEKS TO SCHEDULE/PROCESS THE REFERRAL. IF YOU HAVE NOT HEARD FROM US/SPECIALIST IN TWO WEEKS, PLEASE GIVE [place] A CALL AT [phone].   THE PATIENT IS ENCOURAGED TO PRACTICE SOCIAL DISTANCING DUE TO THE COVID-19 PANDEMIC.

## 2020-06-25 NOTE — Patient Instructions (Signed)
[name] , Thank you for taking time to come for your Medicare Wellness Visit. I appreciate your ongoing commitment to your health goals. Please review the following plan we discussed and let me know if I can assist you in the future.   Screening recommendations/referrals: Colonoscopy: not required Mammogram: not required Bone Density: completed 06/05/2020 Recommended yearly ophthalmology/optometry visit for glaucoma screening and checkup Recommended yearly dental visit for hygiene and checkup  Vaccinations: Influenza vaccine: completed 10/19/2019, due 08/11/2020 Pneumococcal vaccine: completed 10/28/2015 Tdap vaccine: completed 10/28/2015, due 10/27/2025 Shingles vaccine: completed   Covid-19: 06/04/2020, 12/11/2019, 03/16/2019, 02/19/2019  Advanced directives: Advance directive discussed with you today. Even though you declined this today please call our office should you change your mind and we can give you the proper paperwork for you to fill out.  Conditions/risks identified: none  Next appointment: Follow up in one year for your annual wellness visit    Preventive Care 65 Years and Older, Female Preventive care refers to lifestyle choices and visits with your health care provider that can promote health and wellness. What does preventive care include? A yearly physical exam. This is also called an annual well check. Dental exams once or twice a year. Routine eye exams. Ask your health care provider how often you should have your eyes checked. Personal lifestyle choices, including: Daily care of your teeth and gums. Regular physical activity. Eating a healthy diet. Avoiding tobacco and drug use. Limiting alcohol use. Practicing safe sex. Taking low-dose aspirin every day. Taking vitamin and mineral supplements as recommended by your health care provider. What happens during an annual well check? The services and screenings done by your health care provider during your annual well  check will depend on your age, overall health, lifestyle risk factors, and family history of disease. Counseling  Your health care provider may ask you questions about your: Alcohol use. Tobacco use. Drug use. Emotional well-being. Home and relationship well-being. Sexual activity. Eating habits. History of falls. Memory and ability to understand (cognition). Work and work Astronomer. Reproductive health. Screening  You may have the following tests or measurements: Height, weight, and BMI. Blood pressure. Lipid and cholesterol levels. These may be checked every 5 years, or more frequently if you are over 4 years old. Skin check. Lung cancer screening. You may have this screening every year starting at age 84 if you have a 30-pack-year history of smoking and currently smoke or have quit within the past 15 years. Fecal occult blood test (FOBT) of the stool. You may have this test every year starting at age 85. Flexible sigmoidoscopy or colonoscopy. You may have a sigmoidoscopy every 5 years or a colonoscopy every 10 years starting at age 53. Hepatitis C blood test. Hepatitis B blood test. Sexually transmitted disease (STD) testing. Diabetes screening. This is done by checking your blood sugar (glucose) after you have not eaten for a while (fasting). You may have this done every 1-3 years. Bone density scan. This is done to screen for osteoporosis. You may have this done starting at age 33. Mammogram. This may be done every 1-2 years. Talk to your health care provider about how often you should have regular mammograms. Talk with your health care provider about your test results, treatment options, and if necessary, the need for more tests. Vaccines  Your health care provider may recommend certain vaccines, such as: Influenza vaccine. This is recommended every year. Tetanus, diphtheria, and acellular pertussis (Tdap, Td) vaccine. You may need a Td booster  every 10 years. Zoster  vaccine. You may need this after age 67. Pneumococcal 13-valent conjugate (PCV13) vaccine. One dose is recommended after age 63. Pneumococcal polysaccharide (PPSV23) vaccine. One dose is recommended after age 58. Talk to your health care provider about which screenings and vaccines you need and how often you need them. This information is not intended to replace advice given to you by your health care provider. Make sure you discuss any questions you have with your health care provider. Document Released: 01/24/2015 Document Revised: 09/17/2015 Document Reviewed: 10/29/2014 Elsevier Interactive Patient Education  2017 [place] Prevention in the Home Falls can cause injuries. They can happen to people of all ages. There are many things you can do to make your home safe and to help prevent falls. What can I do on the outside of my home? Regularly fix the edges of walkways and driveways and fix any cracks. Remove anything that might make you trip as you walk through a door, such as a raised step or threshold. Trim any bushes or trees on the path to your home. Use bright outdoor lighting. Clear any walking paths of anything that might make someone trip, such as rocks or tools. Regularly check to see if handrails are loose or broken. Make sure that both sides of any steps have handrails. Any raised decks and porches should have guardrails on the edges. Have any leaves, snow, or ice cleared regularly. Use sand or salt on walking paths during winter. Clean up any spills in your garage right away. This includes oil or grease spills. What can I do in the bathroom? Use night lights. Install grab bars by the toilet and in the tub and shower. Do not use towel bars as grab bars. Use non-skid mats or decals in the tub or shower. If you need to sit down in the shower, use a plastic, non-slip stool. Keep the floor dry. Clean up any water that spills on the floor as soon as it happens. Remove  soap buildup in the tub or shower regularly. Attach bath mats securely with double-sided non-slip rug tape. Do not have throw rugs and other things on the floor that can make you trip. What can I do in the bedroom? Use night lights. Make sure that you have a light by your bed that is easy to reach. Do not use any sheets or blankets that are too big for your bed. They should not hang down onto the floor. Have a firm chair that has side arms. You can use this for support while you get dressed. Do not have throw rugs and other things on the floor that can make you trip. What can I do in the kitchen? Clean up any spills right away. Avoid walking on wet floors. Keep items that you use a lot in easy-to-reach places. If you need to reach something above you, use a strong step stool that has a grab bar. Keep electrical cords out of the way. Do not use floor polish or wax that makes floors slippery. If you must use wax, use non-skid floor wax. Do not have throw rugs and other things on the floor that can make you trip. What can I do with my stairs? Do not leave any items on the stairs. Make sure that there are handrails on both sides of the stairs and use them. Fix handrails that are broken or loose. Make sure that handrails are as long as the stairways. Check any carpeting to  make sure that it is firmly attached to the stairs. Fix any carpet that is loose or worn. Avoid having throw rugs at the top or bottom of the stairs. If you do have throw rugs, attach them to the floor with carpet tape. Make sure that you have a light switch at the top of the stairs and the bottom of the stairs. If you do not have them, ask someone to add them for you. What else can I do to help prevent falls? Wear shoes that: Do not have high heels. Have rubber bottoms. Are comfortable and fit you well. Are closed at the toe. Do not wear sandals. If you use a stepladder: Make sure that it is fully opened. Do not climb a  closed stepladder. Make sure that both sides of the stepladder are locked into place. Ask someone to hold it for you, if possible. Clearly mark and make sure that you can see: Any grab bars or handrails. First and last steps. Where the edge of each step is. Use tools that help you move around (mobility aids) if they are needed. These include: Canes. Walkers. Scooters. Crutches. Turn on the lights when you go into a dark area. Replace any light bulbs as soon as they burn out. Set up your furniture so you have a clear path. Avoid moving your furniture around. If any of your floors are uneven, fix them. If there are any pets around you, be aware of where they are. Review your medicines with your doctor. Some medicines can make you feel dizzy. This can increase your chance of falling. Ask your doctor what other things that you can do to help prevent falls. This information is not intended to replace advice given to you by your health care provider. Make sure you discuss any questions you have with your health care provider. Document Released: 10/24/2008 Document Revised: 06/05/2015 Document Reviewed: 02/01/2014 Elsevier Interactive Patient Education  2017 [employer].

## 2020-06-25 NOTE — Progress Notes (Signed)
This visit occurred during the SARS-CoV-2 public health emergency.  Safety protocols were in place, including screening questions prior to the visit, additional usage of staff PPE, and extensive cleaning of exam room while observing appropriate contact time as indicated for disinfecting solutions.  Subjective:   Vickie Brown is a 80 y.o. female who presents for Medicare Annual (Subsequent) preventive examination.  Review of Systems     Cardiac Risk Factors include: advanced age (>18men, >68 women);diabetes mellitus;hypertension;obesity (BMI >30kg/m2);sedentary lifestyle     Objective:    Today's Vitals   06/25/20 1013  BP: 122/62  Pulse: 80  Temp: 97.8 F (36.6 C)  TempSrc: Oral  SpO2: 95%  Weight: 151 lb 9.6 oz (68.8 kg)  Height: 4' 11.4" (1.509 m)   Body mass index is 30.21 kg/m.  Advanced Directives 06/25/2020 06/21/2019 09/28/2018 07/25/2016  Does Patient Have a Medical Advance Directive? No No No No  Would patient like information on creating a medical advance directive? No - Patient declined No - Patient declined - No - Patient declined    Current Medications (verified) Outpatient Encounter Medications as of 06/25/2020  Medication Sig   ascorbic acid (VITAMIN C) 500 MG tablet Take 500 mg by mouth daily.   aspirin 81 MG tablet Take 81 mg by mouth every evening.   benzonatate (TESSALON PERLES) 100 MG capsule Take 1 capsule (100 mg total) by mouth every 6 (six) hours as needed for cough.   BIOTIN PO Take by mouth daily.   Calcium Carbonate (CALCIUM 500 PO) Take by mouth.   cetirizine (ZYRTEC) 10 MG tablet Take 10 mg by mouth at bedtime.   Cholecalciferol (VITAMIN D3) 2000 UNITS TABS Take 1 tablet by mouth daily.   diclofenac sodium (VOLTAREN) 1 % GEL APPLY 2 GRAMS TOPICALLY TO KNEES FOUR TIMES DAILY AS NEEDED   fluticasone (FLONASE) 50 MCG/ACT nasal spray Place 1 spray into both nostrils at bedtime.   Homeopathic Products (OSCILLOCOCCINUM PO) Take by mouth.   JANUVIA  100 MG tablet TAKE 1 TABLET(100 MG) BY MOUTH DAILY   losartan (COZAAR) 50 MG tablet TAKE 1 TABLET(50 MG) BY MOUTH DAILY   Multiple Vitamins-Minerals (ALIVE ONCE DAILY WOMENS 50+ PO) Take 1 tablet by mouth daily.   Omega-3 Fatty Acids (FISH OIL PO) Take by mouth.   pravastatin (PRAVACHOL) 40 MG tablet TAKE 1 TABLET BY MOUTH DAILY   Probiotic Product (ALIGN) 4 MG CAPS Take 4 mg by mouth daily.   TURMERIC PO Take by mouth.   vitamin B-12 (CYANOCOBALAMIN) 500 MCG tablet Take 500 mcg by mouth daily.   esomeprazole (NEXIUM) 40 MG capsule Take 40 mg by mouth daily before breakfast. (Patient not taking: No sig reported)   ipratropium (ATROVENT) 0.03 % nasal spray Place 2 sprays into the nose 3 (three) times daily. (Patient not taking: No sig reported)   No facility-administered encounter medications on file as of 06/25/2020.    Allergies (verified) Patient has no known allergies.   History: Past Medical History:  Diagnosis Date   Chronic headaches    Diverticulitis    Hyperlipidemia    Knee pain    Type 2 diabetes mellitus (HCC)    Past Surgical History:  Procedure Laterality Date   Bilateral tubal ligation  1971   BTL     PARTIAL HYSTERECTOMY  1973   TONSILECTOMY, ADENOIDECTOMY, BILATERAL MYRINGOTOMY AND TUBES  1950   VESICOVAGINAL FISTULA CLOSURE W/ TAH     Family History  Problem Relation Age of Onset  Cervical cancer Mother    Cancer Mother    Diabetes Father    Diabetes Sister    Diabetes Brother    Breast cancer Neg Hx    Social History   Socioeconomic History   Marital status: Divorced    Spouse name: Not on file   Number of children: Not on file   Years of education: Not on file   Highest education level: Not on file  Occupational History   Occupation: retired  Tobacco Use   Smoking status: Never   Smokeless tobacco: Never  Vaping Use   Vaping Use: Never used  Substance and Sexual Activity   Alcohol use: Not Currently    Comment: occassionally   Drug use:  No   Sexual activity: Not Currently    Birth control/protection: Surgical  Other Topics Concern   Not on file  Social History Narrative   Lives alone.   Social Determinants of Health   Financial Resource Strain: Medium Risk   Difficulty of Paying Living Expenses: Somewhat hard  Food Insecurity: Food Insecurity Present   Worried About Running Out of Food in the Last Year: Sometimes true   Ran Out of Food in the Last Year: Sometimes true  Transportation Needs: No Transportation Needs   Lack of Transportation (Medical): No   Lack of Transportation (Non-Medical): No  Physical Activity: Inactive   Days of Exercise per Week: 0 days   Minutes of Exercise per Session: 0 min  Stress: No Stress Concern Present   Feeling of Stress : Not at all  Social Connections: Not on file    Tobacco Counseling Counseling given: Not Answered   Clinical Intake:  Pre-visit preparation completed: Yes  Pain : No/denies pain     Nutritional Status: BMI > 30  Obese Nutritional Risks: None Diabetes: Yes  How often do you need to have someone help you when you read instructions, pamphlets, or other written materials from your doctor or pharmacy?: 1 - Never  Diabetic? Yes Nutrition Risk Assessment:  Has the patient had any N/V/D within the last 2 months?  No  Does the patient have any non-healing wounds?  No  Has the patient had any unintentional weight loss or weight gain?  No   Diabetes:  Is the patient diabetic?  Yes  If diabetic, was a CBG obtained today?  No  Did the patient bring in their glucometer from home?  No  How often do you monitor your CBG's? daily.   Financial Strains and Diabetes Management:  Are you having any financial strains with the device, your supplies or your medication? No .  Does the patient want to be seen by Chronic Care Management for management of their diabetes?  No  Would the patient like to be referred to a Nutritionist or for Diabetic Management?  No    Diabetic Exams:  Diabetic Eye Exam: Overdue for diabetic eye exam. Pt has been advised about the importance in completing this exam. Patient advised to call and schedule an eye exam. Diabetic Foot Exam: Completed 12/24/2019   Interpreter Needed?: No  Information entered by :: NAllen LPN   Activities of Daily Living In your present state of health, do you have any difficulty performing the following activities: 06/25/2020  Hearing? N  Vision? Y  Comment sometimes especially at night  Difficulty concentrating or making decisions? Y  Walking or climbing stairs? Y  Dressing or bathing? N  Doing errands, shopping? N  Preparing Food and eating ? N  Using the Toilet? N  In the past six months, have you accidently leaked urine? Y  Do you have problems with loss of bowel control? Y  Managing your Medications? N  Managing your Finances? N  Housekeeping or managing your Housekeeping? N  Some recent data might be hidden    Patient Care Team: Dorothyann Peng, MD as PCP - General (Internal Medicine) [place], Maryjane Hurter, [facility] (Inactive) (Pharmacist)  Indicate any recent Medical Services you may have received from other than [facility] providers in the past year (date may be approximate).     Assessment:   This is a routine wellness examination for Vickie Brown.  Hearing/Vision screen Vision Screening - Comments:: Regular eye exams, Dr. Fransisco Hertz  Dietary issues and exercise activities discussed: Current Exercise Habits: The patient does not participate in regular exercise at present   Goals Addressed             This Visit's Progress    Patient Stated       06/25/2020, wants to weigh 125 pounds        Depression Screen PHQ 2/9 Scores 06/25/2020 06/21/2019 09/28/2018 04/24/2018 01/26/2018 12/22/2017  PHQ - 2 Score 0 0 1 0 0 0  PHQ- 9 Score - 0 7 - - -    Fall Risk Fall Risk  06/25/2020 06/21/2019 10/12/2018 09/28/2018 04/24/2018  Falls in the past year? 0 0 1 0 1  Number falls in past  yr: - - 0 - 0  Injury with Fall? - - - - 0  Risk for fall due to : Medication side effect Medication side effect - Medication side effect -  Follow up Falls evaluation completed;Education provided;Falls prevention discussed Falls evaluation completed;Education provided;Falls prevention discussed - Falls prevention discussed;Education provided;Falls evaluation completed -    FALL RISK PREVENTION PERTAINING TO THE HOME:  Any stairs in or around the home? Yes  If so, are there any without handrails? No  Home free of loose throw rugs in walkways, pet beds, electrical cords, etc? Yes  Adequate lighting in your home to reduce risk of falls? Yes   ASSISTIVE DEVICES UTILIZED TO PREVENT FALLS:  Life alert? No  Use of a cane, walker or w/c? No  Grab bars in the bathroom? Yes  Shower chair or bench in shower? Yes  Elevated toilet seat or a handicapped toilet? Yes   TIMED UP AND GO:  Was the test performed? No .    Gait steady and fast without use of assistive device  Cognitive Function:     6CIT Screen 06/25/2020 06/21/2019 09/28/2018  What Year? 0 points 0 points 0 points  What month? 0 points 0 points 0 points  What time? 0 points 3 points 0 points  Count back from 20 0 points 0 points 0 points  Months in reverse 0 points 0 points 0 points  Repeat phrase 10 points 0 points 0 points  Total Score 10 3 0    Immunizations Immunization History  Administered Date(s) Administered   DTaP 10/28/2015   Fluad Quad(high Dose 65+) 10/19/2018, 10/19/2019   Influenza Whole 10/11/2009   Influenza, High Dose Seasonal PF 10/21/2017   Influenza-Unspecified 10/19/2018   PFIZER(Purple Top)SARS-COV-2 Vaccination 02/19/2019, 03/16/2019, 12/11/2019, 06/04/2020   Pneumococcal-Unspecified 03/28/2013, 10/28/2015   Zoster Recombinat (Shingrix) 05/13/2016, 08/18/2016    TDAP status: Up to date  Flu Vaccine status: Up to date  Pneumococcal vaccine status: Up to date  Covid-19 vaccine status:  Completed vaccines  Qualifies for Shingles Vaccine? Yes  Zostavax completed No   Shingrix Completed?: Yes  Screening Tests Health Maintenance  Topic Date Due   DEXA SCAN  Never done   OPHTHALMOLOGY EXAM  06/19/2020   INFLUENZA VACCINE  08/11/2020   HEMOGLOBIN A1C  09/25/2020   FOOT EXAM  12/23/2020   TETANUS/TDAP  10/27/2025   COVID-19 Vaccine  Completed   PNA vac Low Risk Adult  Completed   Zoster Vaccines- Shingrix  Completed   HPV VACCINES  Aged Out    Health Maintenance  Health Maintenance Due  Topic Date Due   DEXA SCAN  Never done   OPHTHALMOLOGY EXAM  06/19/2020    Colorectal cancer screening: No longer required.   Mammogram status: No longer required due to age.  Bone Density status: Completed 06/05/2020.   Lung Cancer Screening: (Low Dose CT Chest recommended if Age 9-80 years, 30 pack-year currently smoking OR have quit w/in 15years.) does not qualify.   Lung Cancer Screening Referral: no  Additional Screening:  Hepatitis C Screening: does not qualify;   Vision Screening: Recommended annual ophthalmology exams for early detection of glaucoma and other disorders of the eye. Is the patient up to date with their annual eye exam?  No  Who is the provider or what is the name of the office in which the patient attends annual eye exams? [place] [place] [place] If pt is not established with a provider, would they like to be referred to a provider to establish care? No .   Dental Screening: Recommended annual dental exams for proper oral hygiene  Community Resource Referral / Chronic Care Management: CRR required this visit?  Yes   CCM required this visit?  No      Plan:     I have personally reviewed and noted the following in the patient's chart:   Medical and social history Use of alcohol, tobacco or illicit drugs  Current medications and supplements including opioid prescriptions.  Functional ability and status Nutritional status Physical  activity Advanced directives List of other physicians Hospitalizations, surgeries, and ER visits in previous 12 months Vitals Screenings to include cognitive, depression, and falls Referrals and appointments  In addition, I have reviewed and discussed with patient certain preventive protocols, quality metrics, and best practice recommendations. A written personalized care plan for preventive services as well as general preventive health recommendations were provided to patient.     Barb Merinoickeah E Harrison Paulson, LPN   0/98/11916/15/2022   Nurse Notes:

## 2020-06-25 NOTE — Addendum Note (Signed)
Addended by: Elisha Ponder E on: 06/25/2020 12:48 PM   Modules accepted: Orders

## 2020-06-26 ENCOUNTER — Telehealth: Payer: Self-pay | Admitting: *Deleted

## 2020-06-26 LAB — HEMOGLOBIN A1C
Est. average glucose Bld gHb Est-mCnc: 146 mg/dL
Hgb A1c MFr Bld: 6.7 % — ABNORMAL HIGH (ref 4.8–5.6)

## 2020-06-26 LAB — BMP8+EGFR
BUN/Creatinine Ratio: 7 — ABNORMAL LOW (ref 12–28)
BUN: 6 mg/dL — ABNORMAL LOW (ref 8–27)
CO2: 22 mmol/L (ref 20–29)
Calcium: 9.6 mg/dL (ref 8.7–10.3)
Chloride: 103 mmol/L (ref 96–106)
Creatinine, Ser: 0.9 mg/dL (ref 0.57–1.00)
Glucose: 107 mg/dL — ABNORMAL HIGH (ref 65–99)
Potassium: 4.3 mmol/L (ref 3.5–5.2)
Sodium: 143 mmol/L (ref 134–144)
eGFR: 64 mL/min/{1.73_m2} (ref >59)

## 2020-06-26 NOTE — Telephone Encounter (Signed)
   Telephone encounter was:  Unsuccessful.  06/26/2020 Name: Vickie Brown MRN: [id number] DOB: [birth year]  Unsuccessful outbound call made today to assist with:  Transportation Needs , Food Insecurity, and Caregiver Stress  Outreach Attempt:  1st Attempt  A HIPAA compliant voice message was left requesting a return call.  Instructed patient to call back at   Instructed patient to call back at [phone]  at their earliest convenience.   Alois Cliche -[facility] Guide , Embedded Care Coordination [facility], Care Management  [phone] [address] [place] , [place] [address] Email : [email or web address]

## 2020-07-01 ENCOUNTER — Telehealth: Payer: Self-pay | Admitting: *Deleted

## 2020-07-01 NOTE — Telephone Encounter (Signed)
   Telephone encounter was:  Unsuccessful.  07/01/2020 Name: Vickie Brown MRN: [id number] DOB: 26-Aug-1940  Unsuccessful outbound call made today to assist with:  Transportation Needs , Food Insecurity, and Caregiver Stress  Outreach Attempt:  2nd Attempt  A HIPAA compliant voice message was left requesting a return call.  Instructed patient to call back at   Instructed patient to call back at [phone]  at their earliest convenience.   Alois Cliche -[facility] Guide , Embedded Care Coordination [facility], Care Management  [phone] [address] [place] , [place] [address] Email : [email or web address]

## 2020-07-02 ENCOUNTER — Telehealth: Payer: Self-pay | Admitting: *Deleted

## 2020-07-02 ENCOUNTER — Other Ambulatory Visit: Payer: Self-pay | Admitting: Internal Medicine

## 2020-07-02 NOTE — Telephone Encounter (Signed)
   Telephone encounter was:  Successful.  07/02/2020 Name: Vickie Brown MRN: [id number] DOB: 12/25/1940  Vickie Brown is a 80 y.o. year old female who is a primary care patient of Dorothyann Peng, MD . The community resource team was consulted for assistance with Transportation Needs , Food Insecurity, and Home ModificationsPatient was informed about checking with DSS about possible utility assistance  and is getting food stamps now so does not need MOW at this time , sending information on aging gracefully ,weatherization programs , information on free cell phone service and Internet   Care guide performed the following interventions: Patient provided with information about care guide support team and interviewed to confirm resource needs.  Follow Up Plan:  No further follow up planned at this time. The patient has been provided with needed resources.  Alois Cliche -[facility] Guide , Embedded Care Coordination [facility], Care Management  [phone] [address] [place] , [place] [address] Email : [email or web address]

## 2020-07-08 ENCOUNTER — Telehealth: Payer: Self-pay

## 2020-07-08 NOTE — Chronic Care Management (AMB) (Signed)
   No answer, left message of telephone appointment with Cherylin Mylar CPP on 07-09-2020 at 3:00.Left message to have all medications, supplements, blood pressure and/or blood sugar logs available during appointment and to return call if need to reschedule.   Huey Romans [facility] Clinical Pharmacist Assistant [phone]

## 2020-07-09 ENCOUNTER — Telehealth: Payer: Medicare Other

## 2020-07-15 ENCOUNTER — Encounter: Payer: Self-pay | Admitting: Cardiology

## 2020-07-15 ENCOUNTER — Ambulatory Visit: Payer: Medicare Other | Admitting: Cardiology

## 2020-07-15 ENCOUNTER — Ambulatory Visit: Payer: Self-pay | Admitting: Cardiology

## 2020-07-15 ENCOUNTER — Other Ambulatory Visit: Payer: Self-pay

## 2020-07-15 VITALS — BP 107/61 | HR 77 | Temp 97.3°F | Resp 17 | Ht 59.4 in | Wt 153.0 lb

## 2020-07-15 DIAGNOSIS — E118 Type 2 diabetes mellitus with unspecified complications: Secondary | ICD-10-CM | POA: Diagnosis not present

## 2020-07-15 DIAGNOSIS — E7800 Pure hypercholesterolemia, unspecified: Secondary | ICD-10-CM | POA: Diagnosis not present

## 2020-07-15 DIAGNOSIS — I10 Essential (primary) hypertension: Secondary | ICD-10-CM

## 2020-07-15 DIAGNOSIS — R600 Localized edema: Secondary | ICD-10-CM

## 2020-07-15 DIAGNOSIS — R0609 Other forms of dyspnea: Secondary | ICD-10-CM

## 2020-07-15 MED ORDER — HYDROCHLOROTHIAZIDE 12.5 MG PO CAPS: 12.5000 mg | ORAL_CAPSULE | ORAL | 1 refills | Status: DC

## 2020-07-15 NOTE — Patient Instructions (Signed)
Need to have blood work done in approximately 2 weeks.  If there is a [facility] in [place], he could certainly go there and they would be able to see my orders, the test that you need would be a BMP and BNP to check for your kidney function.  You have been scheduled for an echocardiogram which is ultrasound of your heart and also a stress test.  I will see you back in 4 to 6 weeks for follow-up.

## 2020-07-15 NOTE — Progress Notes (Signed)
Primary Physician/Referring:  Dorothyann Peng, MD  Patient ID: Vickie Brown, female    DOB: 1940-08-11, 80 y.o.   MRN: [id number]  Chief Complaint  Patient presents with   New Patient (Initial Visit)   doe    Ref by Dr. Dorothyann Peng   Leg Swelling   HPI:    Vickie Brown  is a 80 y.o.  African-American female patient referred to me by Dr. Allyne Gee for evaluation of shortness of breath and bilateral ankle edema.  Patient's history significant for diabetes mellitus with stage II CKD, primary hypertension and hyperlipidemia.  Over the past 6 months, she has noticed gradually worsening leg edema especially at the ankles.  She has also noticed gradual progression of dyspnea on exertion over the past 1 year.  She denies chest pain or palpitations, no dizziness or syncope.  She has occasional leg cramps especially at night but denies symptoms of claudication, denies any symptoms of neurologic deficits or tingling or numbness.  Past Medical History:  Diagnosis Date   Chronic headaches    Diverticulitis    Hyperlipidemia    Hypertension    Knee pain    Type 2 diabetes mellitus (HCC)    Past Surgical History:  Procedure Laterality Date   Bilateral tubal ligation  1971   BTL     PARTIAL HYSTERECTOMY  1973   TONSILECTOMY, ADENOIDECTOMY, BILATERAL MYRINGOTOMY AND TUBES  1950   VESICOVAGINAL FISTULA CLOSURE W/ TAH     Family History  Problem Relation Age of Onset   Cervical cancer Mother    Cancer Mother    Diabetes Father    Diabetes Sister    Diabetes Brother    Other Brother        killed   Breast cancer Neg Hx     Social History   Tobacco Use   Smoking status: Never   Smokeless tobacco: Never  Substance Use Topics   Alcohol use: Not Currently    Comment: occassionally   Marital Status: Divorced  ROS  Review of Systems  Cardiovascular:  Positive for dyspnea on exertion and leg swelling. Negative for chest pain.  Gastrointestinal:  Negative for melena.   Objective  Blood pressure 107/61, pulse 77, temperature (!) 97.3 F (36.3 C), temperature source Temporal, resp. rate 17, height 4' 11.4" (1.509 m), weight 153 lb (69.4 kg), SpO2 97 %. Body mass index is 30.49 kg/m.  Vitals with BMI 07/15/2020 06/25/2020 06/25/2020  Height 4' 11.4" 4' 11.4" 4' 11.4"  Weight 153 lbs 151 lbs 10 oz 151 lbs 10 oz  BMI 30.48 30.2 30.2  Systolic 107 122 751  Diastolic 61 62 62  Pulse 77 80 80     Physical Exam Constitutional:      Comments: Short stature  Neck:     Vascular: No carotid bruit or JVD.  Cardiovascular:     Rate and Rhythm: Normal rate and regular rhythm.     Pulses: Intact distal pulses.          Femoral pulses are 2+ on the right side and 2+ on the left side.      Dorsalis pedis pulses are 2+ on the right side and 2+ on the left side.       Posterior tibial pulses are 1+ on the right side and 1+ on the left side.     Heart sounds: Murmur heard.  Blowing midsystolic murmur is present with a grade of 2/6 radiating to the apex.  No gallop.  Pulmonary:     Effort: Pulmonary effort is normal.     Breath sounds: Normal breath sounds.  Abdominal:     General: Bowel sounds are normal.     Palpations: Abdomen is soft.  Musculoskeletal:        General: No swelling (Bilateral 2+ pitting ankle edema). Normal range of motion.  Skin:    General: Skin is warm.     Capillary Refill: Capillary refill takes less than 2 seconds.     Laboratory examination:   Recent Labs    10/03/19 1417 12/24/19 1622 03/25/20 1224 06/25/20 1117  NA 140 142 140 143  K 4.6 4.1 4.2 4.3  CL 100 103 99 103  CO2 29 27 25 22   GLUCOSE 117* 125* 120* 107*  BUN 7* 6* 5* 6*  CREATININE 0.86 0.97 0.93 0.90  CALCIUM 9.6 9.5 9.4 9.6  GFRNONAA 64 55*  --   --   GFRAA 74 64  --   --    estimated creatinine clearance is 41.9 mL/min (by C-G formula based on SCr of 0.9 mg/dL).  CMP Latest Ref Rng & Units 06/25/2020 03/25/2020 12/24/2019  Glucose 65 - 99 mg/dL 409(W107(H)  119(J120(H) 478(G125(H)  BUN 8 - 27 mg/dL 6(L) 5(L) 6(L)  Creatinine 0.57 - 1.00 mg/dL 9.560.90 2.130.93 0.860.97  Sodium 134 - 144 mmol/L 143 140 142  Potassium 3.5 - 5.2 mmol/L 4.3 4.2 4.1  Chloride 96 - 106 mmol/L 103 99 103  CO2 20 - 29 mmol/L 22 25 27   Calcium 8.7 - 10.3 mg/dL 9.6 9.4 9.5  Total Protein 6.0 - 8.5 g/dL - 6.6 -  Total Bilirubin 0.0 - 1.2 mg/dL - 0.3 -  Alkaline Phos 44 - 121 IU/L - 57 -  AST 0 - 40 IU/L - 29 -  ALT 0 - 32 IU/L - 21 -   CBC Latest Ref Rng & Units 10/03/2019 07/25/2016 07/08/2013  WBC 3.4 - 10.8 x10E3/uL 4.6 5.0 6.4  Hemoglobin 11.1 - 15.9 g/dL 57.812.7 11.8(L) 12.8  Hematocrit 34.0 - 46.6 % 39.1 35.6(L) 38.9  Platelets 150 - 450 x10E3/uL 250 245 258    Lipid Panel No results for input(s): CHOL, TRIG, LDLCALC, VLDL, HDL, CHOLHDL, LDLDIRECT in the last 8760 hours. Lipid Panel     Component Value Date/Time   CHOL 143 02/14/2019 1613   TRIG 90 02/14/2019 1613   HDL 71 02/14/2019 1613   CHOLHDL 2.0 02/14/2019 1613   CHOLHDL 2.0 07/09/2010 0411   VLDL 10 07/09/2010 0411   LDLCALC 55 02/14/2019 1613   LABVLDL 17 02/14/2019 1613     HEMOGLOBIN A1C Lab Results  Component Value Date   HGBA1C 6.7 (H) 06/25/2020   MPG 134 (H) 07/09/2010   TSH Recent Labs    03/25/20 1224  TSH 2.140    Medications and allergies  No Known Allergies    Medication prior to this encounter:   Outpatient Medications Prior to Visit  Medication Sig Dispense Refill   acetaminophen (TYLENOL) 325 MG tablet Take 650 mg by mouth every 6 (six) hours as needed.     ascorbic acid (VITAMIN C) 500 MG tablet Take 500 mg by mouth daily.     aspirin 81 MG tablet Take 81 mg by mouth every evening.     benzonatate (TESSALON PERLES) 100 MG capsule Take 1 capsule (100 mg total) by mouth every 6 (six) hours as needed for cough. 30 capsule 1   BIOTIN PO Take by mouth daily.  Calcium Carbonate (CALCIUM 500 PO) Take by mouth.     cetirizine (ZYRTEC) 10 MG tablet Take 10 mg by mouth at bedtime.      Cholecalciferol (VITAMIN D3) 2000 UNITS TABS Take 1 tablet by mouth daily.     diclofenac sodium (VOLTAREN) 1 % GEL APPLY 2 GRAMS TOPICALLY TO KNEES FOUR TIMES DAILY AS NEEDED 100 g 1   fluticasone (FLONASE) 50 MCG/ACT nasal spray Place 1 spray into both nostrils at bedtime. 16 g 2   ipratropium (ATROVENT) 0.03 % nasal spray Place 2 sprays into the nose 3 (three) times daily. 30 mL 2   JANUVIA 100 MG tablet TAKE 1 TABLET(100 MG) BY MOUTH DAILY 90 tablet 1   Multiple Vitamins-Minerals (ALIVE ONCE DAILY WOMENS 50+ PO) Take 1 tablet by mouth daily.     Omega-3 Fatty Acids (FISH OIL PO) Take by mouth.     pravastatin (PRAVACHOL) 40 MG tablet TAKE 1 TABLET BY MOUTH DAILY 90 tablet 1   Probiotic Product (ALIGN) 4 MG CAPS Take 4 mg by mouth daily.     TURMERIC PO Take by mouth.     vitamin B-12 (CYANOCOBALAMIN) 500 MCG tablet Take 500 mcg by mouth daily.     losartan (COZAAR) 50 MG tablet TAKE 1 TABLET(50 MG) BY MOUTH DAILY 90 tablet 1   Homeopathic Products (OSCILLOCOCCINUM PO) Take by mouth.     losartan (COZAAR) 50 MG tablet Take 0.5 tablets (25 mg total) by mouth every morning. 90 tablet 1   esomeprazole (NEXIUM) 40 MG capsule Take 40 mg by mouth daily before breakfast. (Patient not taking: No sig reported)     No facility-administered medications prior to visit.     FINAL MEDICATION AS OF TODAY:   Medications after current encounter Current Outpatient Medications  Medication Instructions   acetaminophen (TYLENOL) 650 mg, Oral, Every 6 hours PRN   Align 4 mg, Daily   ascorbic acid (VITAMIN C) 500 mg, Oral, Daily   aspirin 81 mg, Oral, Every evening   benzonatate (TESSALON PERLES) 100 mg, Oral, Every 6 hours PRN   BIOTIN PO Oral, Daily   Calcium Carbonate (CALCIUM 500 PO) Oral   cetirizine (ZYRTEC) 10 mg, Oral, Daily at bedtime   Cholecalciferol (VITAMIN D3) 2000 UNITS TABS 1 tablet, Oral, Daily,     diclofenac sodium (VOLTAREN) 1 % GEL APPLY 2 GRAMS TOPICALLY TO KNEES FOUR TIMES DAILY  AS NEEDED   fluticasone (FLONASE) 50 MCG/ACT nasal spray 1 spray, Each Nare, Daily at bedtime   Homeopathic Products (OSCILLOCOCCINUM PO) Oral   hydrochlorothiazide (MICROZIDE) 12.5 mg, Oral, BH-each morning   ipratropium (ATROVENT) 0.03 % nasal spray 2 sprays, Nasal, 3 times daily   JANUVIA 100 MG tablet TAKE 1 TABLET(100 MG) BY MOUTH DAILY   losartan (COZAAR) 25 mg, Oral, BH-each morning   Multiple Vitamins-Minerals (ALIVE ONCE DAILY WOMENS 50+ PO) 1 tablet, Daily   Omega-3 Fatty Acids (FISH OIL PO) Oral   pravastatin (PRAVACHOL) 40 MG tablet TAKE 1 TABLET BY MOUTH DAILY   TURMERIC PO Oral   vitamin B-12 (CYANOCOBALAMIN) 500 mcg, Oral, Daily    Radiology:   No results found.  Cardiac Studies:   NA  EKG:   EKG 07/15/2020: Normal sinus rhythm at rate of 72 bpm, left atrial enlargement, normal axis.  Low-voltage complexes.    Assessment     ICD-10-CM   1. Dyspnea on exertion  R06.00 EKG 12-Lead    PCV MYOCARDIAL PERFUSION WITH LEXISCAN    Brain natriuretic peptide  Brain natriuretic peptide    2. Bilateral leg edema  R60.0 hydrochlorothiazide (MICROZIDE) 12.5 MG capsule    3. Primary hypertension  I10 losartan (COZAAR) 50 MG tablet    hydrochlorothiazide (MICROZIDE) 12.5 MG capsule    Basic metabolic panel    Basic metabolic panel    4. Controlled type 2 diabetes mellitus with complication, without long-term current use of insulin (HCC)  E11.8 PCV MYOCARDIAL PERFUSION WITH LEXISCAN    5. Hypercholesteremia  E78.00        Medications Discontinued During This Encounter  Medication Reason   esomeprazole (NEXIUM) 40 MG capsule Error   losartan (COZAAR) 50 MG tablet     Meds ordered this encounter  Medications   hydrochlorothiazide (MICROZIDE) 12.5 MG capsule    Sig: Take 1 capsule (12.5 mg total) by mouth every morning.    Dispense:  90 capsule    Refill:  1   Orders Placed This Encounter  Procedures   Basic metabolic panel    Standing Status:   Future     Number of Occurrences:   1    Standing Expiration Date:   07/15/2021   Brain natriuretic peptide    Standing Status:   Future    Number of Occurrences:   1    Standing Expiration Date:   07/15/2021   PCV MYOCARDIAL PERFUSION WITH LEXISCAN    Standing Status:   Future    Standing Expiration Date:   09/15/2020   EKG 12-Lead   Recommendations:   Vickie Brown is a 80 y.o. African-American female patient referred to me by Dr. Allyne Gee for evaluation of shortness of breath and bilateral ankle edema.  Patient's history significant for diabetes mellitus with stage II CKD, primary hypertension and hyperlipidemia.  Physical examination is unremarkable except for a very soft 2/6 midsystolic murmur at the apex probably suggestive of mild MR.  Vascular examination reveals mildly reduced posterior tibial pulse but otherwise normal.  Her leg edema and dyspnea on exertion may be related to chronic diastolic heart failure and leg edema may also be related to dependent edema.  I will reduce the dose of losartan from 50mg  to 25 mg and add HCTZ 12.5 mg.  We will check BMP and BNP in 2 weeks.  An echocardiogram order has already been placed by Dr. , I will schedule for a Lexiscan Nuclear stress test to evaluate for myocardial ischemia. Patient unable to do treadmill stress testing due to arthritis and kyphoscoliosis. Office visit following the work-up/investigations.      Allyne Gee, MD, [facility] 07/15/2020, 3:23 PM Office: [phone]

## 2020-07-21 ENCOUNTER — Telehealth: Payer: Self-pay

## 2020-07-21 NOTE — Chronic Care Management (AMB) (Signed)
    Chronic Care Management Pharmacy Assistant   Name: Vickie Brown  MRN: [id number] DOB: 06/01/1940   Reason for Encounter: Disease State/ Diabetes  Recent office visits:  06-05-2020 Sanders, Robyn, MD. DG Dexa  06-25-2020 Allen, Nickeah E, LPN. Medicare wellness. Referral placed for community care coordination.  06-25-2020 Sanders, Robyn, MD. BMP8+EGFR Glucose= 107, BUN= 6, BUN/Creatinine ratio= 7  Recent consult visits:  07-15-2020 Ganji, Jay, MD (Cardiology). START HCTZ 12.5 mg daily. CHANGE losartan 50 mg TO 25 mg. STOP Nexium 40 mg.  Hospital visits:  None in previous 6 months  Medications: Outpatient Encounter Medications as of 07/21/2020  Medication Sig   acetaminophen (TYLENOL) 325 MG tablet Take 650 mg by mouth every 6 (six) hours as needed.   ascorbic acid (VITAMIN C) 500 MG tablet Take 500 mg by mouth daily.   aspirin 81 MG tablet Take 81 mg by mouth every evening.   benzonatate (TESSALON PERLES) 100 MG capsule Take 1 capsule (100 mg total) by mouth every 6 (six) hours as needed for cough.   BIOTIN PO Take by mouth daily.   Calcium Carbonate (CALCIUM 500 PO) Take by mouth.   cetirizine (ZYRTEC) 10 MG tablet Take 10 mg by mouth at bedtime.   Cholecalciferol (VITAMIN D3) 2000 UNITS TABS Take 1 tablet by mouth daily.   diclofenac sodium (VOLTAREN) 1 % GEL APPLY 2 GRAMS TOPICALLY TO KNEES FOUR TIMES DAILY AS NEEDED   fluticasone (FLONASE) 50 MCG/ACT nasal spray Place 1 spray into both nostrils at bedtime.   Homeopathic Products (OSCILLOCOCCINUM PO) Take by mouth.   hydrochlorothiazide (MICROZIDE) 12.5 MG capsule Take 1 capsule (12.5 mg total) by mouth every morning.   ipratropium (ATROVENT) 0.03 % nasal spray Place 2 sprays into the nose 3 (three) times daily.   JANUVIA 100 MG tablet TAKE 1 TABLET(100 MG) BY MOUTH DAILY   losartan (COZAAR) 50 MG tablet Take 0.5 tablets (25 mg total) by mouth every morning.   Multiple Vitamins-Minerals (ALIVE ONCE DAILY WOMENS 50+ PO)  Take 1 tablet by mouth daily.   Omega-3 Fatty Acids (FISH OIL PO) Take by mouth.   pravastatin (PRAVACHOL) 40 MG tablet TAKE 1 TABLET BY MOUTH DAILY   Probiotic Product (ALIGN) 4 MG CAPS Take 4 mg by mouth daily.   TURMERIC PO Take by mouth.   vitamin B-12 (CYANOCOBALAMIN) 500 MCG tablet Take 500 mcg by mouth daily.   No facility-administered encounter medications on file as of 07/21/2020.   07-21-2020: 1st attempt left VM 07-24-2020: 2nd attempts left VM 07-25-2020: 3rd attempt left VM  Care Gaps: RAF= 1.223% Ophthalmology exam overdue. Medicare wellness 07-30-2021  Star Rating Drugs: Januvia 100 mg- Last filled 07-02-2020 90 DS [facility] Losartan 25 mg- Last filled 07-15-2020 90 DS [facility] Pravastatin 40 mg-Last filled 07-02-2020 90 DS [facility]   Malecca Hicks CMA Clinical Pharmacist Assistant [phone]  

## 2020-08-03 DIAGNOSIS — Z20822 Contact with and (suspected) exposure to covid-19: Secondary | ICD-10-CM | POA: Diagnosis not present

## 2020-08-05 ENCOUNTER — Ambulatory Visit: Payer: Medicare Other | Admitting: Internal Medicine

## 2020-08-06 ENCOUNTER — Other Ambulatory Visit: Payer: Medicare Other

## 2020-08-20 ENCOUNTER — Other Ambulatory Visit: Payer: Self-pay

## 2020-08-20 ENCOUNTER — Ambulatory Visit: Payer: Medicare Other

## 2020-08-20 DIAGNOSIS — R0609 Other forms of dyspnea: Secondary | ICD-10-CM

## 2020-08-20 DIAGNOSIS — E118 Type 2 diabetes mellitus with unspecified complications: Secondary | ICD-10-CM | POA: Diagnosis not present

## 2020-08-20 DIAGNOSIS — I10 Essential (primary) hypertension: Secondary | ICD-10-CM | POA: Diagnosis not present

## 2020-08-20 DIAGNOSIS — R059 Cough, unspecified: Secondary | ICD-10-CM

## 2020-08-20 DIAGNOSIS — R0600 Dyspnea, unspecified: Secondary | ICD-10-CM

## 2020-08-20 MED ORDER — BENZONATATE 100 MG PO CAPS: 100.0000 mg | ORAL_CAPSULE | Freq: Four times a day (QID) | ORAL | 0 refills | Status: AC | PRN

## 2020-08-21 LAB — BASIC METABOLIC PANEL
BUN/Creatinine Ratio: 9 — ABNORMAL LOW (ref 12–28)
BUN: 8 mg/dL (ref 8–27)
CO2: 25 mmol/L (ref 20–29)
Calcium: 9.9 mg/dL (ref 8.7–10.3)
Chloride: 91 mmol/L — ABNORMAL LOW (ref 96–106)
Creatinine, Ser: 0.87 mg/dL (ref 0.57–1.00)
Glucose: 144 mg/dL — ABNORMAL HIGH (ref 65–99)
Potassium: 4.8 mmol/L (ref 3.5–5.2)
Sodium: 133 mmol/L — ABNORMAL LOW (ref 134–144)
eGFR: 67 mL/min/{1.73_m2} (ref >59)

## 2020-08-21 LAB — BRAIN NATRIURETIC PEPTIDE: BNP: 4.3 pg/mL (ref 0.0–100.0)

## 2020-08-27 ENCOUNTER — Other Ambulatory Visit: Payer: Medicare Other

## 2020-08-27 ENCOUNTER — Other Ambulatory Visit: Payer: Self-pay

## 2020-08-27 ENCOUNTER — Ambulatory Visit: Payer: Medicare Other | Admitting: Cardiology

## 2020-08-27 ENCOUNTER — Encounter: Payer: Self-pay | Admitting: Cardiology

## 2020-08-27 VITALS — BP 104/60 | HR 73 | Temp 98.0°F | Resp 17 | Ht 59.4 in | Wt 150.0 lb

## 2020-08-27 DIAGNOSIS — I10 Essential (primary) hypertension: Secondary | ICD-10-CM

## 2020-08-27 DIAGNOSIS — R0600 Dyspnea, unspecified: Secondary | ICD-10-CM

## 2020-08-27 DIAGNOSIS — R600 Localized edema: Secondary | ICD-10-CM | POA: Diagnosis not present

## 2020-08-27 DIAGNOSIS — R0609 Other forms of dyspnea: Secondary | ICD-10-CM

## 2020-08-27 NOTE — Progress Notes (Signed)
Primary Physician/Referring:  Dorothyann Peng, MD  Patient ID: [name], female    DOB: August 31, 1940, 80 y.o.   MRN: [id number]  Chief Complaint  Patient presents with   Shortness of Breath   Leg Swelling   HPI:    HANNIA MATCHETT  is a 80 y.o.  African-American female patient referred to me by Dr. Allyne Gee for evaluation of shortness of breath and bilateral ankle edema.  Patient's history significant for diabetes mellitus with stage II CKD, primary hypertension and hyperlipidemia.  She was evaluated by me 6 weeks ago, I had added HCTZ for leg edema and decreased losartan to 25 mg in view of soft blood pressure and she now presents for follow-up of dyspnea and leg edema and hypertension.  She is tolerating the medication well and has not had any side effects.  She has noticed mild improvement in dyspnea, leg edema is completely resolved.     Past Medical History:  Diagnosis Date   Chronic headaches    Diverticulitis    Hyperlipidemia    Hypertension    Knee pain    Type 2 diabetes mellitus (HCC)    Past Surgical History:  Procedure Laterality Date   Bilateral tubal ligation  1971   BTL     PARTIAL HYSTERECTOMY  1973   TONSILECTOMY, ADENOIDECTOMY, BILATERAL MYRINGOTOMY AND TUBES  1950   VESICOVAGINAL FISTULA CLOSURE W/ TAH     Family History  Problem Relation Age of Onset   Cervical cancer Mother    Cancer Mother    Diabetes Father    Diabetes Sister    Diabetes Brother    Other Brother        killed   Breast cancer Neg Hx     Social History   Tobacco Use   Smoking status: Never   Smokeless tobacco: Never  Substance Use Topics   Alcohol use: Not Currently    Comment: occassionally   Marital Status: Divorced  ROS  Review of Systems  Cardiovascular:  Positive for dyspnea on exertion. Negative for chest pain and leg swelling.  Gastrointestinal:  Negative for melena.  Objective  Blood pressure 104/60, pulse 73, temperature 98 F (36.7 C), temperature  source Temporal, resp. rate 17, height 4' 11.4" (1.509 m), weight 150 lb (68 kg), SpO2 95 %. Body mass index is 29.89 kg/m.  Vitals with BMI 08/27/2020 08/27/2020 07/15/2020  Height - 4' 11.4" 4' 11.4"  Weight - 150 lbs 153 lbs  BMI - 29.88 30.48  Systolic 104 113 681  Diastolic 60 56 61  Pulse 73 78 77     Physical Exam Constitutional:      Comments: Short stature  Neck:     Vascular: No carotid bruit or JVD.  Cardiovascular:     Rate and Rhythm: Normal rate and regular rhythm.     Pulses: Intact distal pulses.          Femoral pulses are 2+ on the right side and 2+ on the left side.      Dorsalis pedis pulses are 2+ on the right side and 2+ on the left side.       Posterior tibial pulses are 1+ on the right side and 1+ on the left side.     Heart sounds: Murmur heard.  Midsystolic murmur is present with a grade of 2/6 radiating to the apex.    No gallop.  Pulmonary:     Effort: Pulmonary effort is normal.  Breath sounds: Normal breath sounds.  Abdominal:     General: Bowel sounds are normal.     Palpations: Abdomen is soft.  Musculoskeletal:        General: No swelling (Bilateral 2+ pitting ankle edema). Normal range of motion.     Right lower leg: No edema.  Skin:    General: Skin is warm.     Capillary Refill: Capillary refill takes less than 2 seconds.     Laboratory examination:   Recent Labs    10/03/19 1417 12/24/19 1622 03/25/20 1224 06/25/20 1117 08/20/20 1310  NA 140 142 140 143 133*  K 4.6 4.1 4.2 4.3 4.8  CL 100 103 99 103 91*  CO2 29 27 25 22 25   GLUCOSE 117* 125* 120* 107* 144*  BUN 7* 6* 5* 6* 8  CREATININE 0.86 0.97 0.93 0.90 0.87  CALCIUM 9.6 9.5 9.4 9.6 9.9  GFRNONAA 64 55*  --   --   --   GFRAA 74 64  --   --   --    estimated creatinine clearance is 43 mL/min (by C-G formula based on SCr of 0.87 mg/dL).  CMP Latest Ref Rng & Units 08/20/2020 06/25/2020 03/25/2020  Glucose 65 - 99 mg/dL 03/27/2020) 240(X) 735(H)  BUN 8 - 27 mg/dL 8 6(L) 5(L)   Creatinine 0.57 - 1.00 mg/dL 299(M 4.26 8.34  Sodium 134 - 144 mmol/L 133(L) 143 140  Potassium 3.5 - 5.2 mmol/L 4.8 4.3 4.2  Chloride 96 - 106 mmol/L 91(L) 103 99  CO2 20 - 29 mmol/L 25 22 25   Calcium 8.7 - 10.3 mg/dL 9.9 9.6 9.4  Total Protein 6.0 - 8.5 g/dL - - 6.6  Total Bilirubin 0.0 - 1.2 mg/dL - - 0.3  Alkaline Phos 44 - 121 IU/L - - 57  AST 0 - 40 IU/L - - 29  ALT 0 - 32 IU/L - - 21   CBC Latest Ref Rng & Units 10/03/2019 07/25/2016 07/08/2013  WBC 3.4 - 10.8 x10E3/uL 4.6 5.0 6.4  Hemoglobin 11.1 - 15.9 g/dL 07/27/2016 11.8(L) 12.8  Hematocrit 34.0 - 46.6 % 39.1 35.6(L) 38.9  Platelets 150 - 450 x10E3/uL 250 245 258    Lipid Panel No results for input(s): CHOL, TRIG, LDLCALC, VLDL, HDL, CHOLHDL, LDLDIRECT in the last 8760 hours. Lipid Panel     Component Value Date/Time   CHOL 143 02/14/2019 1613   TRIG 90 02/14/2019 1613   HDL 71 02/14/2019 1613   CHOLHDL 2.0 02/14/2019 1613   CHOLHDL 2.0 07/09/2010 0411   VLDL 10 07/09/2010 0411   LDLCALC 55 02/14/2019 1613   LABVLDL 17 02/14/2019 1613     HEMOGLOBIN A1C Lab Results  Component Value Date   HGBA1C 6.7 (H) 06/25/2020   MPG 134 (H) 07/09/2010   TSH Recent Labs    03/25/20 1224  TSH 2.140  BNP    Component Value Date/Time   BNP 4.3 08/20/2020 1311    ProBNP No results found for: PROBNP   Medications and allergies  No Known Allergies    Medication prior to this encounter:   Outpatient Medications Prior to Visit  Medication Sig Dispense Refill   acetaminophen (TYLENOL) 325 MG tablet Take 650 mg by mouth every 6 (six) hours as needed.     ascorbic acid (VITAMIN C) 500 MG tablet Take 500 mg by mouth daily.     aspirin 81 MG tablet Take 81 mg by mouth every evening.     benzonatate (TESSALON PERLES)  100 MG capsule Take 1 capsule (100 mg total) by mouth every 6 (six) hours as needed for cough. 30 capsule 0   BIOTIN PO Take by mouth daily.     Calcium Carbonate (CALCIUM 500 PO) Take by mouth.     cetirizine  (ZYRTEC) 10 MG tablet Take 10 mg by mouth at bedtime.     Cholecalciferol (VITAMIN D3) 2000 UNITS TABS Take 1 tablet by mouth daily.     diclofenac sodium (VOLTAREN) 1 % GEL APPLY 2 GRAMS TOPICALLY TO KNEES FOUR TIMES DAILY AS NEEDED 100 g 1   fluticasone (FLONASE) 50 MCG/ACT nasal spray Place 1 spray into both nostrils at bedtime. 16 g 2   Homeopathic Products (OSCILLOCOCCINUM PO) Take by mouth.     hydrochlorothiazide (MICROZIDE) 12.5 MG capsule Take 1 capsule (12.5 mg total) by mouth every morning. 90 capsule 1   ipratropium (ATROVENT) 0.03 % nasal spray Place 2 sprays into the nose 3 (three) times daily. (Patient taking differently: Place 2 sprays into the nose as needed.) 30 mL 2   JANUVIA 100 MG tablet TAKE 1 TABLET(100 MG) BY MOUTH DAILY 90 tablet 1   losartan (COZAAR) 50 MG tablet Take 0.5 tablets (25 mg total) by mouth every morning. 90 tablet 1   Multiple Vitamins-Minerals (ALIVE ONCE DAILY WOMENS 50+ PO) Take 1 tablet by mouth daily.     Omega-3 Fatty Acids (FISH OIL PO) Take by mouth.     pravastatin (PRAVACHOL) 40 MG tablet TAKE 1 TABLET BY MOUTH DAILY 90 tablet 1   Probiotic Product (ALIGN) 4 MG CAPS Take 4 mg by mouth daily.     TURMERIC PO Take by mouth.     vitamin B-12 (CYANOCOBALAMIN) 500 MCG tablet Take 500 mcg by mouth daily.     No facility-administered medications prior to visit.     FINAL MEDICATION AS OF TODAY:   Medications after current encounter Current Outpatient Medications  Medication Instructions   acetaminophen (TYLENOL) 650 mg, Oral, Every 6 hours PRN   Align 4 mg, Daily   ascorbic acid (VITAMIN C) 500 mg, Oral, Daily   aspirin 81 mg, Oral, Every evening   benzonatate (TESSALON PERLES) 100 mg, Oral, Every 6 hours PRN   BIOTIN PO Oral, Daily   Calcium Carbonate (CALCIUM 500 PO) Oral   cetirizine (ZYRTEC) 10 mg, Oral, Daily at bedtime   Cholecalciferol (VITAMIN D3) 2000 UNITS TABS 1 tablet, Oral, Daily,     diclofenac sodium (VOLTAREN) 1 % GEL APPLY 2  GRAMS TOPICALLY TO KNEES FOUR TIMES DAILY AS NEEDED   fluticasone (FLONASE) 50 MCG/ACT nasal spray 1 spray, Each Nare, Daily at bedtime   Homeopathic Products (OSCILLOCOCCINUM PO) Oral   hydrochlorothiazide (MICROZIDE) 12.5 mg, Oral, BH-each morning   ipratropium (ATROVENT) 0.03 % nasal spray 2 sprays, Nasal, 3 times daily   JANUVIA 100 MG tablet TAKE 1 TABLET(100 MG) BY MOUTH DAILY   losartan (COZAAR) 25 mg, Oral, BH-each morning   Multiple Vitamins-Minerals (ALIVE ONCE DAILY WOMENS 50+ PO) 1 tablet, Daily   Omega-3 Fatty Acids (FISH OIL PO) Oral   pravastatin (PRAVACHOL) 40 MG tablet TAKE 1 TABLET BY MOUTH DAILY   TURMERIC PO Oral   vitamin B-12 (CYANOCOBALAMIN) 500 mcg, Oral, Daily    Radiology:   No results found.  Cardiac Studies:   Lexiscan  Nuclear stress test 08/20/2020: Nondiagnostic ECG stress. Myocardial perfusion is normal. Overall LV systolic function is normal without regional wall motion abnormalities. Stress LV EF: 59%.  No previous  exam available for comparison. Low risk.   Echocardiogram pending  EKG:   EKG 07/15/2020: Normal sinus rhythm at rate of 72 bpm, left atrial enlargement, normal axis.  Low-voltage complexes.    Assessment     ICD-10-CM   1. Dyspnea on exertion  R06.00     2. Bilateral leg edema  R60.0     3. Primary hypertension  I10        There are no discontinued medications.   No orders of the defined types were placed in this encounter.  No orders of the defined types were placed in this encounter.  Recommendations:   Pricilla LarssonGaynelle J Conely is a 80 y.o. African-American female patient referred to me by Dr. Allyne GeeSanders for evaluation of shortness of breath and bilateral ankle edema.  Patient's history significant for diabetes mellitus with stage II CKD, primary hypertension and hyperlipidemia.  On her last office visit 6 weeks ago, I had reduced the dose of losartan from 50 mg to 25 mg and added HCTZ, she is tolerating this well, renal function  has remained stable, blood pressure is also well controlled and there is complete resolution of leg edema.  I suspect her dyspnea on exertion in view of normal BNP is related to deconditioning.  Advised her to increase her physical activity as tolerated.  I also reassured her in view of normal nuclear stress test.  Echocardiogram is pending, do not suspect significant valvular abnormality, suspect mild MR.  We will follow-up on this.  I would like to see her back in 3 months for follow-up, if she remains stable I will see her back on a as needed basis.   Yates DecampJay Paden Senger, MD, [facility] 08/27/2020, 11:49 AM Office: [phone] Fax: [phone] Pager: [phone]

## 2020-08-28 DIAGNOSIS — E119 Type 2 diabetes mellitus without complications: Secondary | ICD-10-CM | POA: Diagnosis not present

## 2020-08-28 DIAGNOSIS — E1165 Type 2 diabetes mellitus with hyperglycemia: Secondary | ICD-10-CM | POA: Diagnosis not present

## 2020-09-05 ENCOUNTER — Ambulatory Visit: Payer: Medicare Other | Admitting: Internal Medicine

## 2020-09-09 ENCOUNTER — Other Ambulatory Visit: Payer: Self-pay

## 2020-09-09 ENCOUNTER — Other Ambulatory Visit: Payer: Medicare Other

## 2020-09-09 DIAGNOSIS — R0609 Other forms of dyspnea: Secondary | ICD-10-CM

## 2020-09-09 DIAGNOSIS — R0600 Dyspnea, unspecified: Secondary | ICD-10-CM

## 2020-09-09 NOTE — Progress Notes (Deleted)
Office Visit Note  Patient: Vickie Brown             Date of Birth: 12-Feb-1940           MRN: [id number]             PCP: Dorothyann Peng, MD Referring: Dorothyann Peng, MD Visit Date: 09/10/2020 Occupation: @GUAROCC @  Subjective:  No chief complaint on file.   History of Present Illness: JAMONI BROADFOOT is a 80 y.o. female here for osteoporosis.***   Labs reviewed 03/2020 CMP unrermarkable  06/05/20 DEXA AP Lumbar spine L3-L4  -2.5 0.931 Left femoral neck   -2.6 0.577   Activities of Daily Living:  Patient reports morning stiffness for *** {minute/hour:19697}.   Patient {ACTIONS;DENIES/REPORTS:21021675::"Denies"} nocturnal pain.  Difficulty dressing/grooming: {ACTIONS;DENIES/REPORTS:21021675::"Denies"} Difficulty climbing stairs: {ACTIONS;DENIES/REPORTS:21021675::"Denies"} Difficulty getting out of chair: {ACTIONS;DENIES/REPORTS:21021675::"Denies"} Difficulty using hands for taps, buttons, cutlery, and/or writing: {ACTIONS;DENIES/REPORTS:21021675::"Denies"}  No Rheumatology ROS completed.   PMFS History:  Patient Active Problem List   Diagnosis Date Noted   Type 2 diabetes mellitus with stage 2 chronic kidney disease, without long-term current use of insulin (HCC) 12/22/2017   Chronic renal disease, stage II 12/22/2017   Hypertensive nephropathy 12/22/2017   Dermatitis 12/22/2017   Dyspnea on exertion 06/22/2010   Myalgia 06/22/2010    Past Medical History:  Diagnosis Date   Chronic headaches    Diverticulitis    Hyperlipidemia    Hypertension    Knee pain    Type 2 diabetes mellitus (HCC)     Family History  Problem Relation Age of Onset   Cervical cancer Mother    Cancer Mother    Diabetes Father    Diabetes Sister    Diabetes Brother    Other Brother        killed   Breast cancer Neg Hx    Past Surgical History:  Procedure Laterality Date   Bilateral tubal ligation  1971   BTL     PARTIAL HYSTERECTOMY  1973   TONSILECTOMY, ADENOIDECTOMY,  BILATERAL MYRINGOTOMY AND TUBES  1950   VESICOVAGINAL FISTULA CLOSURE W/ TAH     Social History   Social History Narrative   Lives alone.   Immunization History  Administered Date(s) Administered   DTaP 10/28/2015   Fluad Quad(high Dose 65+) 10/19/2018, 10/19/2019   Influenza Whole 10/11/2009   Influenza, High Dose Seasonal PF 10/21/2017   Influenza-Unspecified 10/19/2018   PFIZER(Purple Top)SARS-COV-2 Vaccination 02/19/2019, 03/16/2019, 12/11/2019, 06/04/2020   Pneumococcal-Unspecified 03/28/2013, 10/28/2015   Zoster Recombinat (Shingrix) 05/13/2016, 08/18/2016     Objective: Vital Signs: There were no vitals taken for this visit.   Physical Exam   Musculoskeletal Exam: ***  CDAI Exam: CDAI Score: -- Patient Global: --; Provider Global: -- Swollen: --; Tender: -- Joint Exam 09/10/2020   No joint exam has been documented for this visit   There is currently no information documented on the homunculus. Go to the Rheumatology activity and complete the homunculus joint exam.  Investigation: No additional findings.  Imaging: PCV MYOCARDIAL PERFUSION WITH LEXISCAN  Result Date: 08/23/2020 Lexiscan  Nuclear stress test 08/20/2020: Nondiagnostic ECG stress. Myocardial perfusion is normal. Overall LV systolic function is normal without regional wall motion abnormalities. Stress LV EF: 59%. No previous exam available for comparison. Low risk.    Recent Labs: Lab Results  Component Value Date   WBC 4.6 10/03/2019   HGB 12.7 10/03/2019   PLT 250 10/03/2019   NA 133 (L) 08/20/2020   K 4.8 08/20/2020  CL 91 (L) 08/20/2020   CO2 25 08/20/2020   GLUCOSE 144 (H) 08/20/2020   BUN 8 08/20/2020   CREATININE 0.87 08/20/2020   BILITOT 0.3 03/25/2020   ALKPHOS 57 03/25/2020   AST 29 03/25/2020   ALT 21 03/25/2020   PROT 6.6 03/25/2020   ALBUMIN 4.1 03/25/2020   CALCIUM 9.9 08/20/2020   GFRAA 64 12/24/2019    Speciality Comments: No specialty comments  available.  Procedures:  No procedures performed Allergies: Patient has no known allergies.   Assessment / Plan:     Visit Diagnoses: No diagnosis found.  Orders: No orders of the defined types were placed in this encounter.  No orders of the defined types were placed in this encounter.   Face-to-face time spent with patient was *** minutes. Greater than 50% of time was spent in counseling and coordination of care.  Follow-Up Instructions: No follow-ups on file.   Fuller Plan, MD  Note - This record has been created using [employer].  Chart creation errors have been sought, but may not always  have been located. Such creation errors do not reflect on  the standard of medical care.

## 2020-09-10 ENCOUNTER — Ambulatory Visit: Payer: Medicare Other | Admitting: Internal Medicine

## 2020-09-17 ENCOUNTER — Encounter: Payer: Self-pay | Admitting: Internal Medicine

## 2020-09-30 ENCOUNTER — Other Ambulatory Visit: Payer: Self-pay | Admitting: Internal Medicine

## 2020-10-09 ENCOUNTER — Ambulatory Visit (INDEPENDENT_AMBULATORY_CARE_PROVIDER_SITE_OTHER): Payer: Medicare Other | Admitting: Internal Medicine

## 2020-10-09 ENCOUNTER — Other Ambulatory Visit: Payer: Self-pay | Admitting: Internal Medicine

## 2020-10-09 ENCOUNTER — Other Ambulatory Visit: Payer: Self-pay

## 2020-10-09 ENCOUNTER — Encounter: Payer: Self-pay | Admitting: Internal Medicine

## 2020-10-09 VITALS — BP 130/70 | Temp 98.7°F | Ht 59.0 in | Wt 150.6 lb

## 2020-10-09 DIAGNOSIS — E1122 Type 2 diabetes mellitus with diabetic chronic kidney disease: Secondary | ICD-10-CM | POA: Diagnosis not present

## 2020-10-09 DIAGNOSIS — I129 Hypertensive chronic kidney disease with stage 1 through stage 4 chronic kidney disease, or unspecified chronic kidney disease: Secondary | ICD-10-CM

## 2020-10-09 DIAGNOSIS — E6609 Other obesity due to excess calories: Secondary | ICD-10-CM

## 2020-10-09 DIAGNOSIS — Z0000 Encounter for general adult medical examination without abnormal findings: Secondary | ICD-10-CM | POA: Diagnosis not present

## 2020-10-09 DIAGNOSIS — N182 Chronic kidney disease, stage 2 (mild): Secondary | ICD-10-CM | POA: Diagnosis not present

## 2020-10-09 DIAGNOSIS — Z23 Encounter for immunization: Secondary | ICD-10-CM | POA: Diagnosis not present

## 2020-10-09 DIAGNOSIS — I10 Essential (primary) hypertension: Secondary | ICD-10-CM

## 2020-10-09 DIAGNOSIS — Z6830 Body mass index (BMI) 30.0-30.9, adult: Secondary | ICD-10-CM

## 2020-10-09 DIAGNOSIS — R253 Fasciculation: Secondary | ICD-10-CM

## 2020-10-09 DIAGNOSIS — E66811 Obesity, class 1: Secondary | ICD-10-CM

## 2020-10-09 LAB — POCT URINALYSIS DIPSTICK
Bilirubin, UA: NEGATIVE
Blood, UA: NEGATIVE
Glucose, UA: NEGATIVE
Ketones, UA: NEGATIVE
Nitrite, UA: NEGATIVE
Protein, UA: NEGATIVE
Spec Grav, UA: 1.01 (ref 1.010–1.025)
Urobilinogen, UA: 0.2 E.U./dL
pH, UA: 7 (ref 5.0–8.0)

## 2020-10-09 NOTE — Progress Notes (Signed)
Rich Brave [place]leton,acting as a Education administrator for Maximino Greenland, MD.,have documented all relevant documentation on the behalf of Maximino Greenland, MD,as directed by  Maximino Greenland, MD while in the presence of Maximino Greenland, MD.  This visit occurred during the SARS-CoV-2 public health emergency.  Safety protocols were in place, including screening questions prior to the visit, additional usage of staff PPE, and extensive cleaning of exam room while observing appropriate contact time as indicated for disinfecting solutions.  Subjective:     Patient ID: Vickie Brown , female    DOB: 1940/03/09 , 80 y.o.   MRN: [id number]   Chief Complaint  Patient presents with   Annual Exam   Diabetes   Hypertension    HPI  Patient presents today for a full physical exam. She reports compliance with meds. She denies headaches, chest pain, palpitations and shortness of breath.   Diabetes She presents for her follow-up diabetic visit. She has type 2 diabetes mellitus. Her disease course has been stable. Pertinent negatives for diabetes include no blurred vision and no chest pain. There are no hypoglycemic complications. Risk factors for coronary artery disease include diabetes mellitus, dyslipidemia, hypertension and post-menopausal. She is following a diabetic diet. She participates in exercise intermittently. Her breakfast blood glucose is taken between 8-9 am. Her breakfast blood glucose range is generally 90-110 mg/dl. An ACE inhibitor/angiotensin II receptor blocker is being taken. She does not see a podiatrist. Hypertension This is a chronic problem. The current episode started more than 1 year ago. The problem has been gradually improving since onset. Pertinent negatives include no blurred vision, chest pain, palpitations or shortness of breath. The current treatment provides moderate improvement. Hypertensive end-organ damage includes kidney disease. Identifiable causes of hypertension include chronic  renal disease.    Past Medical History:  Diagnosis Date   Chronic headaches    Diverticulitis    Hyperlipidemia    Hypertension    Knee pain    Type 2 diabetes mellitus ([place])      Family History  Problem Relation Age of Onset   Cervical cancer Mother    Cancer Mother    Diabetes Father    Diabetes Sister    Diabetes Brother    Other Brother        killed   Breast cancer Neg Hx      Current Outpatient Medications:    acetaminophen (TYLENOL) 325 MG tablet, Take 650 mg by mouth every 6 (six) hours as needed., Disp: , Rfl:    ascorbic acid (VITAMIN C) 500 MG tablet, Take 500 mg by mouth daily., Disp: , Rfl:    aspirin 81 MG tablet, Take 81 mg by mouth every evening., Disp: , Rfl:    benzonatate (TESSALON PERLES) 100 MG capsule, Take 1 capsule (100 mg total) by mouth every 6 (six) hours as needed for cough., Disp: 30 capsule, Rfl: 0   BIOTIN PO, Take by mouth daily., Disp: , Rfl:    Calcium Carbonate (CALCIUM 500 PO), Take by mouth., Disp: , Rfl:    cetirizine (ZYRTEC) 10 MG tablet, Take 10 mg by mouth at bedtime., Disp: , Rfl:    Cholecalciferol (VITAMIN D3) 2000 UNITS TABS, Take 1 tablet by mouth daily., Disp: , Rfl:    diclofenac sodium (VOLTAREN) 1 % GEL, APPLY 2 GRAMS TOPICALLY TO KNEES FOUR TIMES DAILY AS NEEDED, Disp: 100 g, Rfl: 1   fluticasone (FLONASE) 50 MCG/ACT nasal spray, Place 1 spray into both nostrils at bedtime.,  Disp: 16 g, Rfl: 2   Homeopathic Products (OSCILLOCOCCINUM PO), Take by mouth., Disp: , Rfl:    hydrochlorothiazide (MICROZIDE) 12.5 MG capsule, Take 1 capsule (12.5 mg total) by mouth every morning., Disp: 90 capsule, Rfl: 1   ipratropium (ATROVENT) 0.03 % nasal spray, Place 2 sprays into the nose 3 (three) times daily. (Patient taking differently: Place 2 sprays into the nose as needed.), Disp: 30 mL, Rfl: 2   JANUVIA 100 MG tablet, TAKE 1 TABLET(100 MG) BY MOUTH DAILY, Disp: 90 tablet, Rfl: 1   losartan (COZAAR) 50 MG tablet, Take 0.5 tablets (25 mg  total) by mouth every morning., Disp: 90 tablet, Rfl: 1   Multiple Vitamins-Minerals (ALIVE ONCE DAILY WOMENS 50+ PO), Take 1 tablet by mouth daily., Disp: , Rfl:    Omega-3 Fatty Acids (FISH OIL PO), Take by mouth., Disp: , Rfl:    pravastatin (PRAVACHOL) 40 MG tablet, TAKE 1 TABLET BY MOUTH DAILY, Disp: 90 tablet, Rfl: 1   Probiotic Product (ALIGN) 4 MG CAPS, Take 4 mg by mouth daily., Disp: , Rfl:    TURMERIC PO, Take by mouth., Disp: , Rfl:    vitamin B-12 (CYANOCOBALAMIN) 500 MCG tablet, Take 500 mcg by mouth daily., Disp: , Rfl:    No Known Allergies    The patient states she uses post menopausal status for birth control. Last LMP was No LMP recorded. Patient is postmenopausal.. Negative for Dysmenorrhea. Negative for: breast discharge, breast lump(s), breast pain and breast self exam. Associated symptoms include abnormal vaginal bleeding. Pertinent negatives include abnormal bleeding (hematology), anxiety, decreased libido, depression, difficulty falling sleep, dyspareunia, history of infertility, nocturia, sexual dysfunction, sleep disturbances, urinary incontinence, urinary urgency, vaginal discharge and vaginal itching. Diet regular.The patient states her exercise level is  intermittent.   . The patient's tobacco use is:  Social History   Tobacco Use  Smoking Status Never  Smokeless Tobacco Never  . She has been exposed to passive smoke. The patient's alcohol use is:  Social History   Substance and Sexual Activity  Alcohol Use Yes   Comment: occassionally   Review of Systems  Constitutional: Negative.   HENT: Negative.    Eyes: Negative.  Negative for blurred vision.  Respiratory: Negative.  Negative for shortness of breath.   Cardiovascular:  Positive for leg swelling. Negative for chest pain and palpitations.  Gastrointestinal: Negative.   Endocrine: Negative.   Genitourinary: Negative.   Musculoskeletal: Negative.        She c/o left eye twitching constantly. Recently  had eye exam.   Skin: Negative.   Allergic/Immunologic: Negative.   Neurological: Negative.   Hematological: Negative.   Psychiatric/Behavioral: Negative.      Today's Vitals   10/09/20 1114  BP: 130/70  Temp: 98.7 F (37.1 C)  TempSrc: Oral  Weight: 150 lb 9.6 oz (68.3 kg)  Height: 4' 11"  (1.499 m)   Body mass index is 30.42 kg/m.  Wt Readings from Last 3 Encounters:  10/14/20 149 lb (67.6 kg)  10/09/20 150 lb 9.6 oz (68.3 kg)  08/27/20 150 lb (68 kg)    Objective:  Physical Exam Vitals and nursing note reviewed.  Constitutional:      Appearance: Normal appearance.  HENT:     Head: Normocephalic and atraumatic.     Right Ear: Tympanic membrane, ear canal and external ear normal.     Left Ear: Tympanic membrane, ear canal and external ear normal.     Nose: Nose normal.  Mouth/Throat:     Mouth: Mucous membranes are moist.     Pharynx: Oropharynx is clear.  Eyes:     Extraocular Movements: Extraocular movements intact.     Conjunctiva/sclera: Conjunctivae normal.     Pupils: Pupils are equal, round, and reactive to light.     Comments: Left lower lid fasculations  Cardiovascular:     Rate and Rhythm: Normal rate and regular rhythm.     Pulses:          Dorsalis pedis pulses are 1+ on the right side and 1+ on the left side.     Heart sounds: Normal heart sounds.  Pulmonary:     Effort: Pulmonary effort is normal.     Breath sounds: Normal breath sounds.  Chest:  Breasts:    Tanner Score is 5.     Right: Normal.     Left: Normal.  Abdominal:     General: Abdomen is flat. Bowel sounds are normal.     Palpations: Abdomen is soft.  Genitourinary:    Comments: deferred Musculoskeletal:        General: Normal range of motion.     Cervical back: Normal range of motion and neck supple.  Feet:     Right foot:     Protective Sensation: 5 sites tested.  5 sites sensed.     Skin integrity: Dry skin present.     Toenail Condition: Right toenails are long.      Left foot:     Protective Sensation: 5 sites tested.  5 sites sensed.     Skin integrity: Dry skin present.     Toenail Condition: Left toenails are long.  Skin:    General: Skin is warm and dry.  Neurological:     General: No focal deficit present.     Mental Status: She is alert and oriented to person, place, and time.  Psychiatric:        Mood and Affect: Mood normal.        Behavior: Behavior normal.        Assessment And Plan:     1. Encounter for general adult medical examination w/o abnormal findings Comments: A full exam was performed. Importance of monthly self breast exams was discussed with the patient. PATIENT IS ADVISED TO GET 30-45 MINUTES REGULAR EXERCISE NO LESS THAN FOUR TO FIVE DAYS PER WEEK - BOTH WEIGHTBEARING EXERCISES AND AEROBIC ARE RECOMMENDED.  PATIENT IS ADVISED TO FOLLOW A HEALTHY DIET WITH AT LEAST SIX FRUITS/VEGGIES PER DAY, DECREASE INTAKE OF RED MEAT, AND TO INCREASE FISH INTAKE TO TWO DAYS PER WEEK.  MEATS/FISH SHOULD NOT BE FRIED, BAKED OR BROILED IS PREFERABLE.  IT IS ALSO IMPORTANT TO CUT BACK ON YOUR SUGAR INTAKE. PLEASE AVOID ANYTHING WITH ADDED SUGAR, CORN SYRUP OR OTHER SWEETENERS. IF YOU MUST USE A SWEETENER, YOU CAN TRY STEVIA. IT IS ALSO IMPORTANT TO AVOID ARTIFICIALLY SWEETENERS AND DIET BEVERAGES. LASTLY, I SUGGEST WEARING SPF 50 SUNSCREEN ON EXPOSED PARTS AND ESPECIALLY WHEN IN THE DIRECT SUNLIGHT FOR AN EXTENDED PERIOD OF TIME.  PLEASE AVOID FAST FOOD RESTAURANTS AND INCREASE YOUR WATER INTAKE.  2. Type 2 diabetes mellitus with stage 2 chronic kidney disease, without long-term current use of insulin ([facility]) Comments: Diabetic foot exam was performed. I DISCUSSED WITH THE PATIENT AT LENGTH REGARDING THE GOALS OF GLYCEMIC CONTROL AND POSSIBLE LONG-TERM COMPLICATIONS.  I  ALSO STRESSED THE IMPORTANCE OF COMPLIANCE WITH HOME GLUCOSE MONITORING, DIETARY RESTRICTIONS INCLUDING AVOIDANCE OF SUGARY DRINKS/PROCESSED FOODS,  ALONG WITH REGULAR EXERCISE.  I  ALSO  STRESSED THE IMPORTANCE OF ANNUAL EYE EXAMS, SELF FOOT CARE AND COMPLIANCE WITH OFFICE VISITS.  - POCT Urinalysis Dipstick (81002) - CBC - CMP14+EGFR - Lipid panel - Hemoglobin A1c  3. Hypertensive nephropathy Comments: Chronic, controlled. Advised to follow low sodium diet. EKG performed, NSR w/o acute changes. She will f/uin 4-6 months.  - POCT Urinalysis Dipstick (81002)  4. Fasciculations Comments: I will check electrolytes and magnesium level.  - Magnesium  5. Class 1 obesity due to excess calories with serious comorbidity and body mass index (BMI) of 30.0 to 30.9 in adult Comments: Her BMI is acceptable for her demographic. Advised to aim for at least 150 minutes of exercise per week.  6. Need for influenza vaccination - Flu Vaccine QUAD High Dose(Fluad)  Patient was given opportunity to ask questions. Patient verbalized understanding of the plan and was able to repeat key elements of the plan. All questions were answered to their satisfaction.   I, Maximino Greenland, MD, have reviewed all documentation for this visit. The documentation on 10/19/20 for the exam, diagnosis, procedures, and orders are all accurate and complete.   THE PATIENT IS ENCOURAGED TO PRACTICE SOCIAL DISTANCING DUE TO THE COVID-19 PANDEMIC.

## 2020-10-09 NOTE — Patient Instructions (Addendum)
Magnesium glycinate powder or capsules, use as directed nightly   Health Maintenance, Female Adopting a healthy lifestyle and getting preventive care are important in promoting health and wellness. Ask your health care provider about: The right schedule for you to have regular tests and exams. Things you can do on your own to prevent diseases and keep yourself healthy. What should I know about diet, weight, and exercise? Eat a healthy diet  Eat a diet that includes plenty of vegetables, fruits, low-fat dairy products, and lean protein. Do not eat a lot of foods that are high in solid fats, added sugars, or sodium. Maintain a healthy weight Body mass index (BMI) is used to identify weight problems. It estimates body fat based on height and weight. Your health care provider can help determine your BMI and help you achieve or maintain a healthy weight. Get regular exercise Get regular exercise. This is one of the most important things you can do for your health. Most adults should: Exercise for at least 150 minutes each week. The exercise should increase your heart rate and make you sweat (moderate-intensity exercise). Do strengthening exercises at least twice a week. This is in addition to the moderate-intensity exercise. Spend less time sitting. Even light physical activity can be beneficial. Watch cholesterol and blood lipids Have your blood tested for lipids and cholesterol at 81 years of age, then have this test every 5 years. Have your cholesterol levels checked more often if: Your lipid or cholesterol levels are high. You are older than 81 years of age. You are at high risk for heart disease. What should I know about cancer screening? Depending on your health history and family history, you may need to have cancer screening at various ages. This may include screening for: Breast cancer. Cervical cancer. Colorectal cancer. Skin cancer. Lung cancer. What should I know about heart  disease, diabetes, and high blood pressure? Blood pressure and heart disease High blood pressure causes heart disease and increases the risk of stroke. This is more likely to develop in people who have high blood pressure readings, are of African descent, or are overweight. Have your blood pressure checked: Every 3-5 years if you are 81-33 years of age. Every year if you are 58 years old or older. Diabetes Have regular diabetes screenings. This checks your fasting blood sugar level. Have the screening done: Once every three years after age 81 if you are at a normal weight and have a low risk for diabetes. More often and at a younger age if you are overweight or have a high risk for diabetes. What should I know about preventing infection? Hepatitis B If you have a higher risk for hepatitis B, you should be screened for this virus. Talk with your health care provider to find out if you are at risk for hepatitis B infection. Hepatitis C Testing is recommended for: Everyone born from [birth year] through 1966. Anyone with known risk factors for hepatitis C. Sexually transmitted infections (STIs) Get screened for STIs, including gonorrhea and chlamydia, if: You are sexually active and are younger than 81 years of age. You are older than 81 years of age and your health care provider tells you that you are at risk for this type of infection. Your sexual activity has changed since you were last screened, and you are at increased risk for chlamydia or gonorrhea. Ask your health care provider if you are at risk. Ask your health care provider about whether you are at high risk  for HIV. Your health care provider may recommend a prescription medicine to help prevent HIV infection. If you choose to take medicine to prevent HIV, you should first get tested for HIV. You should then be tested every 3 months for as long as you are taking the medicine. Pregnancy If you are about to stop having your period  (premenopausal) and you may become pregnant, seek counseling before you get pregnant. Take 400 to 800 micrograms (mcg) of folic acid every day if you become pregnant. Ask for birth control (contraception) if you want to prevent pregnancy. Osteoporosis and menopause Osteoporosis is a disease in which the bones lose minerals and strength with aging. This can result in bone fractures. If you are 81 years old or older, or if you are at risk for osteoporosis and fractures, ask your health care provider if you should: Be screened for bone loss. Take a calcium or vitamin D supplement to lower your risk of fractures. Be given hormone replacement therapy (HRT) to treat symptoms of menopause. Follow these instructions at home: Lifestyle Do not use any products that contain nicotine or tobacco, such as cigarettes, e-cigarettes, and chewing tobacco. If you need help quitting, ask your health care provider. Do not use street drugs. Do not share needles. Ask your health care provider for help if you need support or information about quitting drugs. Alcohol use Do not drink alcohol if: Your health care provider tells you not to drink. You are pregnant, may be pregnant, or are planning to become pregnant. If you drink alcohol: Limit how much you use to 0-1 drink a day. Limit intake if you are breastfeeding. Be aware of how much alcohol is in your drink. In the [place] one drink equals one 12 oz bottle of beer (355 mL), one 5 oz glass of wine (148 mL), or one 1 oz glass of hard liquor (44 mL). General instructions Schedule regular health, dental, and eye exams. Stay current with your vaccines. Tell your health care provider if: You often feel depressed. You have ever been abused or do not feel safe at home. Summary Adopting a healthy lifestyle and getting preventive care are important in promoting health and wellness. Follow your health care provider's instructions about healthy diet, exercising, and  getting tested or screened for diseases. Follow your health care provider's instructions on monitoring your cholesterol and blood pressure. This information is not intended to replace advice given to you by your health care provider. Make sure you discuss any questions you have with your health care provider. Document Revised: 03/07/2020 Document Reviewed: 12/21/2017 Elsevier Patient Education  2022 [employer].

## 2020-10-10 LAB — CBC
Hematocrit: 35.9 % (ref 34.0–46.6)
Hemoglobin: 12.1 g/dL (ref 11.1–15.9)
MCH: 28.5 pg (ref 26.6–33.0)
MCHC: 33.7 g/dL (ref 31.5–35.7)
MCV: 85 fL (ref 79–97)
Platelets: 301 10*3/uL (ref 150–450)
RBC: 4.25 x10E6/uL (ref 3.77–5.28)
RDW: 13.4 % (ref 11.7–15.4)
WBC: 4 10*3/uL (ref 3.4–10.8)

## 2020-10-10 LAB — CMP14+EGFR
ALT: 19 IU/L (ref 0–32)
AST: 27 IU/L (ref 0–40)
Albumin/Globulin Ratio: 2 (ref 1.2–2.2)
Albumin: 4.7 g/dL — ABNORMAL HIGH (ref 3.6–4.6)
Alkaline Phosphatase: 53 IU/L (ref 44–121)
BUN/Creatinine Ratio: 8 — ABNORMAL LOW (ref 12–28)
BUN: 7 mg/dL — ABNORMAL LOW (ref 8–27)
Bilirubin Total: 0.3 mg/dL (ref 0.0–1.2)
CO2: 27 mmol/L (ref 20–29)
Calcium: 9.9 mg/dL (ref 8.7–10.3)
Chloride: 93 mmol/L — ABNORMAL LOW (ref 96–106)
Creatinine, Ser: 0.91 mg/dL (ref 0.57–1.00)
Globulin, Total: 2.3 g/dL (ref 1.5–4.5)
Glucose: 112 mg/dL — ABNORMAL HIGH (ref 70–99)
Potassium: 4.6 mmol/L (ref 3.5–5.2)
Sodium: 135 mmol/L (ref 134–144)
Total Protein: 7 g/dL (ref 6.0–8.5)
eGFR: 63 mL/min/{1.73_m2} (ref >59)

## 2020-10-10 LAB — MAGNESIUM: Magnesium: 1.6 mg/dL (ref 1.6–2.3)

## 2020-10-10 LAB — LIPID PANEL
Chol/HDL Ratio: 2.4 ratio (ref 0.0–4.4)
Cholesterol, Total: 150 mg/dL (ref 100–199)
HDL: 63 mg/dL (ref >39)
LDL Chol Calc (NIH): 66 mg/dL (ref 0–99)
Triglycerides: 120 mg/dL (ref 0–149)
VLDL Cholesterol Cal: 21 mg/dL (ref 5–40)

## 2020-10-10 LAB — HEMOGLOBIN A1C
Est. average glucose Bld gHb Est-mCnc: 137 mg/dL
Hgb A1c MFr Bld: 6.4 % — ABNORMAL HIGH (ref 4.8–5.6)

## 2020-10-14 ENCOUNTER — Other Ambulatory Visit: Payer: Self-pay

## 2020-10-14 ENCOUNTER — Ambulatory Visit: Payer: Medicare Other | Admitting: Internal Medicine

## 2020-10-14 ENCOUNTER — Encounter: Payer: Self-pay | Admitting: Internal Medicine

## 2020-10-14 VITALS — BP 111/58 | HR 82 | Resp 16 | Ht 60.5 in | Wt 149.0 lb

## 2020-10-14 DIAGNOSIS — N182 Chronic kidney disease, stage 2 (mild): Secondary | ICD-10-CM

## 2020-10-14 DIAGNOSIS — M159 Polyosteoarthritis, unspecified: Secondary | ICD-10-CM | POA: Diagnosis not present

## 2020-10-14 DIAGNOSIS — E559 Vitamin D deficiency, unspecified: Secondary | ICD-10-CM | POA: Diagnosis not present

## 2020-10-14 DIAGNOSIS — M810 Age-related osteoporosis without current pathological fracture: Secondary | ICD-10-CM | POA: Diagnosis not present

## 2020-10-14 NOTE — Progress Notes (Signed)
Office Visit Note  Patient: Vickie Brown             Date of Birth: Aug 22, 1940           MRN: [id number]             PCP: Dorothyann Peng, MD Referring: Dorothyann Peng, MD Visit Date: 10/14/2020  Subjective:  New Patient (Initial Visit) (Bil knee pain, bil ankle swelling)   History of Present Illness: Vickie Brown is a 80 y.o. female with a history of type 2 diabetes, hypertensive nephropathy, diverticulitis, and generalized osteoarthritis here for management of osteoporosis.  She had bone density test in May consistent with osteoporosis of the lumbar spine and in the left femoral neck.  No comparison bone density available to review.  She has no history of fragility fractures.  She has not fallen during the past year.  She does not have particular problems of dizziness or vertigo, no significant visual impairment, does not have severe peripheral neuropathy.  She already takes daily vitamin D supplementation.  No history of cancers and no history of long-term steroid treatments. She also complains of joint pain in multiple sites today with generalized osteoarthritis.  There is sometimes swelling in the right ankle sometimes extending part way to the right knee otherwise joint pain not associated with significant swelling elsewhere.   06/05/20 D[facility] AP LUMBAR SPINE L3 and L4 Bone Mineral Density (BMD):  0.931 g/cm2 Young Adult T-Score:  -2.5 LEFT FEMUR NECK Bone Mineral Density (BMD):  0.577 g/cm2 Young Adult T-Score: -2.6    Activities of Daily Living:  Patient reports morning stiffness for 1 hour.   Patient Reports nocturnal pain.  Difficulty dressing/grooming: Denies Difficulty climbing stairs: Denies Difficulty getting out of chair: Denies Difficulty using hands for taps, buttons, cutlery, and/or writing: Denies  Review of Systems  Constitutional:  Positive for fatigue.  HENT:  Positive for mouth dryness.   Eyes:  Negative for dryness.  Respiratory:   Positive for shortness of breath.   Cardiovascular:  Positive for swelling in legs/feet.  Gastrointestinal:  Negative for constipation.  Endocrine: Positive for cold intolerance.  Genitourinary:  Negative for difficulty urinating.  Musculoskeletal:  Positive for joint pain, joint pain, joint swelling and morning stiffness.  Skin:  Negative for rash.  Allergic/Immunologic: Negative for susceptible to infections.  Neurological:  Negative for numbness.  Hematological:  Negative for bruising/bleeding tendency.  Psychiatric/Behavioral:  Negative for sleep disturbance.    PMFS History:  Patient Active Problem List   Diagnosis Date Noted   Fasciculations 10/19/2020   Class 1 obesity due to excess calories with serious comorbidity and body mass index (BMI) of 30.0 to 30.9 in adult 10/19/2020   Age-related osteoporosis without current pathological fracture 10/14/2020   Vitamin D deficiency 10/14/2020   Generalized osteoarthritis 10/14/2020   Type 2 diabetes mellitus with stage 2 chronic kidney disease, without long-term current use of insulin (HCC) 12/22/2017   Chronic renal disease, stage II 12/22/2017   Hypertensive nephropathy 12/22/2017   Dermatitis 12/22/2017   Dyspnea on exertion 06/22/2010   Myalgia 06/22/2010    Past Medical History:  Diagnosis Date   Chronic headaches    Diverticulitis    Hyperlipidemia    Hypertension    Knee pain    Type 2 diabetes mellitus (HCC)     Family History  Problem Relation Age of Onset   Cervical cancer Mother    Cancer Mother    Diabetes Father  Diabetes Sister    Diabetes Brother    Other Brother        killed   Breast cancer Neg Hx    Past Surgical History:  Procedure Laterality Date   Bilateral tubal ligation  1971   BTL     PARTIAL HYSTERECTOMY  1973   TONSILECTOMY, ADENOIDECTOMY, BILATERAL MYRINGOTOMY AND TUBES  1950   VESICOVAGINAL FISTULA CLOSURE W/ TAH     Social History   Social History Narrative   Lives alone.    Immunization History  Administered Date(s) Administered   DTaP 10/28/2015   Fluad Quad(high Dose 65+) 10/19/2018, 10/19/2019, 10/09/2020   Influenza Whole 10/11/2009   Influenza, High Dose Seasonal PF 10/21/2017   Influenza-Unspecified 10/19/2018   PFIZER(Purple Top)SARS-COV-2 Vaccination 02/19/2019, 03/16/2019, 12/11/2019, 06/04/2020   Pneumococcal-Unspecified 03/28/2013, 10/28/2015   Zoster Recombinat (Shingrix) 05/13/2016, 08/18/2016     Objective: Vital Signs: BP (!) 111/58 (BP Location: Right Arm, Patient Position: Sitting, Cuff Size: Normal)   Pulse 82   Resp 16   Ht 5' 0.5" (1.537 m)   Wt 149 lb (67.6 kg)   BMI 28.62 kg/m    Physical Exam HENT:     Mouth/Throat:     Mouth: Mucous membranes are moist.     Pharynx: Oropharynx is clear.  Cardiovascular:     Rate and Rhythm: Normal rate and regular rhythm.  Pulmonary:     Effort: Pulmonary effort is normal.     Breath sounds: Normal breath sounds.  Musculoskeletal:     Right lower leg: No edema.  Skin:    General: Skin is warm and dry.     Findings: No rash.  Neurological:     Mental Status: She is alert.  Psychiatric:        Mood and Affect: Mood normal.     Musculoskeletal Exam:  Neck full ROM no tenderness Shoulders full ROM no tenderness or swelling Elbows full ROM no tenderness or swelling Osteoarthritis of the wrist and first CMC joint with mild Heberden's nodes of interphalangeal joints bilaterally Increased kyphosis in upper thoracic spine no focal tenderness to palpation Bilateral patellofemoral crepitus good knee joint range of motion no palpable effusions Mild soft tissue swelling over lateral right ankle no palpable joint effusion range of motion is symmetric   Investigation: No additional findings.  Imaging: No results found.  Recent Labs: Lab Results  Component Value Date   WBC 4.0 10/09/2020   HGB 12.1 10/09/2020   PLT 301 10/09/2020   NA 135 10/09/2020   K 4.6 10/09/2020   CL 93  (L) 10/09/2020   CO2 27 10/09/2020   GLUCOSE 112 (H) 10/09/2020   BUN 7 (L) 10/09/2020   CREATININE 0.91 10/09/2020   BILITOT 0.3 10/09/2020   ALKPHOS 53 10/09/2020   AST 27 10/09/2020   ALT 19 10/09/2020   PROT 7.2 10/14/2020   ALBUMIN 4.7 (H) 10/09/2020   CALCIUM 9.9 10/09/2020   GFRAA 64 12/24/2019    Speciality Comments: No specialty comments available.  Procedures:  No procedures performed Allergies: Patient has no known allergies.   Assessment / Plan:     Visit Diagnoses: Age-related osteoporosis without current pathological fracture - Plan: Parathyroid hormone, intact (no Ca), VITAMIN D 25 Hydroxy (Vit-D Deficiency, Fractures), Serum protein electrophoresis with reflex  Findings look consistent with age-related osteoporosis with no history of fractures.  She has never been on previous medical therapy.  Discussed recommendation for treatment with oral bisphosphonate for reduction of 10-year fracture risk.  She  is in fairly good health and activity level for chronological age so I believe there is a benefit for fracture risk reduction.  No history of GERD strictures or significant dysphagia so no contraindication to oral bisphosphonate treatment.  Discussed risks of medication including atypical femoral fracture and osteonecrosis of the jaw, she is edentulous with no tooth surgeries planned.  We will check baseline parathyroid hormone, vitamin D, and SPEP checking for secondary reversible causes.  Assuming normal we will plan to start alendronate 70 mg p.o. weekly.  Chronic renal disease, stage II  Mild chronic renal disease function appears stable on most recent metabolic panel reviewed from just a week ago.  Vitamin D deficiency  Remote history of deficiency she takes daily supplementation we will check vitamin D level today for adequacy prior to starting any medical treatment of osteoporosis.  Generalized osteoarthritis  Osteoarthritis in numerous sites no particular  inflammatory changes, none indicative for nsufficiency fracture or traumas.  Orders: Orders Placed This Encounter  Procedures   Parathyroid hormone, intact (no Ca)   VITAMIN D 25 Hydroxy (Vit-D Deficiency, Fractures)   Serum protein electrophoresis with reflex   IFE Interpretation    No orders of the defined types were placed in this encounter.    Follow-Up Instructions: Return in about 6 months (around 04/14/2021) for New pt osteoporosis alendronate start f/u 43mos.   Fuller Plan, MD  Note - This record has been created using [employer].  Chart creation errors have been sought, but may not always  have been located. Such creation errors do not reflect on  the standard of medical care.

## 2020-10-14 NOTE — Patient Instructions (Signed)
You have changes of decreased bone density called osteoporosis this raises your risk for fractures in the future.  I recommend starting medicine called alendronate for treatment we will check a few baseline blood tests first to make sure this is the best treatment.  Osteoporosis Osteoporosis happens when the bones become thin and less dense than normal. Osteoporosis makes bones more brittle and fragile and more likely to break (fracture). Over time, osteoporosis can cause your bones to become so weak that they fracture after a minor fall. Bones in the hip, wrist, and spine are most likely to fracture due to osteoporosis. What are the causes? The exact cause of this condition is not known. What increases the risk? You are more likely to develop this condition if you: Have family members with this condition. Have poor nutrition. Use the following: Steroid medicines, such as prednisone. Anti-seizure medicines. Nicotine or tobacco, such as cigarettes, e-cigarettes, and chewing tobacco. Are female. Are age 65 or older. Are not physically active (are sedentary). Are of European or Asian descent. Have a small body frame. What are the signs or symptoms? A fracture might be the first sign of osteoporosis, especially if the fracture results from a fall or injury that usually would not cause a bone to break. Other signs and symptoms include: Pain in the neck or low back. Stooped posture. Loss of height. How is this diagnosed? This condition may be diagnosed based on: Your medical history. A physical exam. A bone mineral density test, also called a DXA or DEXA test (dual-energy X-ray absorptiometry test). This test uses X-rays to measure the amount of minerals in your bones. How is this treated? This condition may be treated by: Making lifestyle changes, such as: Including foods with more calcium and vitamin D in your diet. Doing weight-bearing and muscle-strengthening exercises. Stopping  tobacco use. Limiting alcohol intake. Taking medicine to slow the process of bone loss or to increase bone density. Taking daily supplements of calcium and vitamin D. Taking hormone replacement medicines, such as estrogen for women and testosterone for men. Monitoring your levels of calcium and vitamin D. The goal of treatment is to strengthen your bones and lower your risk for a fracture. Follow these instructions at home: Eating and drinking Include calcium and vitamin D in your diet. Calcium is important for bone health, and vitamin D helps your body absorb calcium. Good sources of calcium and vitamin D include: Certain fatty fish, such as salmon and tuna. Products that have calcium and vitamin D added to them (are fortified), such as fortified cereals. Egg yolks. Cheese. Liver.  Activity Do exercises as told by your health care provider. Ask your health care provider what exercises and activities are safe for you. You should do: Exercises that make you work against gravity (weight-bearing exercises), such as tai chi, yoga, or walking. Exercises to strengthen muscles, such as lifting weights. Lifestyle Do not drink alcohol if: Your health care provider tells you not to drink. You are pregnant, may be pregnant, or are planning to become pregnant. If you drink alcohol: Limit how much you use to: 0-1 drink a day for women. 0-2 drinks a day for men. Know how much alcohol is in your drink. In the [place] one drink equals one 12 oz bottle of beer (355 mL), one 5 oz glass of wine (148 mL), or one 1 oz glass of hard liquor (44 mL). Do not use any products that contain nicotine or tobacco, such as cigarettes, e-cigarettes, and chewing  tobacco. If you need help quitting, ask your health care provider. Preventing falls Use devices to help you move around (mobility aids) as needed, such as canes, walkers, scooters, or crutches. Keep rooms well-lit and clutter-free. Remove tripping hazards from  walkways, including cords and throw rugs. Install grab bars in bathrooms and safety rails on stairs. Use rubber mats in the bathroom and other areas that are often wet or slippery. Wear closed-toe shoes that fit well and support your feet. Wear shoes that have rubber soles or low heels. Review your medicines with your health care provider. Some medicines can cause dizziness or changes in blood pressure, which can increase your risk of falling. General instructions Take over-the-counter and prescription medicines only as told by your health care provider. Keep all follow-up visits. This is important. Contact a health care provider if: You have never been screened for osteoporosis and you are: A woman who is age 32 or older. A man who is age 38 or older. Get help right away if: You fall or injure yourself. Summary Osteoporosis is thinning and loss of density in your bones. This makes bones more brittle and fragile and more likely to break (fracture),even with minor falls. The goal of treatment is to strengthen your bones and lower your risk for a fracture. Include calcium and vitamin D in your diet. Calcium is important for bone health, and vitamin D helps your body absorb calcium. Talk with your health care provider about screening for osteoporosis if you are a woman who is age 18 or older, or a man who is age 78 or older.  Alendronate Tablets What is this medication? ALENDRONATE (a LEN droe nate) prevents and treats osteoporosis. It may also be used to treat Paget disease of the bone. It works by Paramedic stronger and less likely to break (fracture). It belongs to a group of medications called bisphosphonates. This medicine may be used for other purposes; ask your health care provider or pharmacist if you have questions. COMMON BRAND NAME(S): Fosamax What should I tell my care team before I take this medication? They need to know if you have any of these conditions: Bleeding  disorder Cancer Dental disease Difficulty swallowing Infection (fever, chills, cough, sore throat, pain or trouble passing urine) Kidney disease Low levels of calcium or other minerals in the blood Low red blood cell counts Receiving steroids like dexamethasone or prednisone Stomach or intestine problems Trouble sitting or standing for 30 minutes An unusual or allergic reaction to alendronate, other medications, foods, dyes or preservatives Pregnant or trying to get pregnant Breast-feeding How should I use this medication? Take this medication by mouth with a full glass of water. Take it as directed on the prescription label at the same time every day. Take the dose right after waking up. Do not eat or drink anything before taking it. Do not take it with any other drink except water. Do not chew or crush the tablet. After taking it, do not eat breakfast, drink, or take any other medications or vitamins for at least 30 minutes. Sit or stand up for at least 30 minutes after you take it. Do not lie down. Keep taking it unless your care team tells you to stop. A special MedGuide will be given to you by the pharmacist with each prescription and refill. Be sure to read this information carefully each time. Talk to your care team about the use of this medication in children. Special care may be needed. Overdosage:  If you think you have taken too much of this medicine contact a poison control center or emergency room at once. NOTE: This medicine is only for you. Do not share this medicine with others. What if I miss a dose? If you take your medication once a day, skip it. Take your next dose at the scheduled time the next morning. Do not take two doses on the same day. If you take your medication once a week, take the missed dose on the morning after you remember. Do not take two doses on the same day. What may interact with this medication? Aluminum hydroxide Antacids Aspirin Calcium  supplements Medications for inflammation like ibuprofen, naproxen, and others Iron supplements Magnesium supplements Vitamins with minerals This list may not describe all possible interactions. Give your health care provider a list of all the medicines, herbs, non-prescription drugs, or dietary supplements you use. Also tell them if you smoke, drink alcohol, or use illegal drugs. Some items may interact with your medicine. What should I watch for while using this medication? Visit your care team for regular checks on your progress. It may be some time before you see the benefit from this medication. Some people who take this medication have severe bone, joint, or muscle pain. This medication may also increase your risk for jaw problems or a broken thigh bone. Tell your care team right away if you have severe pain in your jaw, bones, joints, or muscles. Tell you care team if you have any pain that does not go away or that gets worse. Tell your dentist and dental surgeon that you are taking this medication. You should not have major dental surgery while on this medication. See your dentist to have a dental exam and fix any dental problems before starting this medication. Take good care of your teeth while on this medication. Make sure you see your dentist for regular follow-up appointments. You should make sure you get enough calcium and vitamin D while you are taking this medication. Discuss the foods you eat and the vitamins you take with your care team. You may need blood work done while you are taking this medication. What side effects may I notice from receiving this medication? Side effects that you should report to your care team as soon as possible: Allergic reactions-skin rash, itching, hives, swelling of the face, lips, tongue, or throat Low calcium level-muscle pain or cramps, confusion, tingling, or numbness in the hands or feet Osteonecrosis of the jaw-pain, swelling, or redness in the  mouth, numbness of the jaw, poor healing after dental work, unusual discharge from the mouth, visible bones in the mouth Pain or trouble swallowing Severe bone, joint, or muscle pain Stomach bleeding-bloody or black, tar-like stools, vomiting blood or brown material that looks like coffee grounds Side effects that usually do not require medical attention (report to your care team if they continue or are bothersome): Constipation Diarrhea Nausea Stomach pain This list may not describe all possible side effects. Call your doctor for medical advice about side effects. You may report side effects to FDA at 1-800-FDA-1088. Where should I keep my medication? Keep out of the reach of children and pets. Store at room temperature between 15 and 30 degrees C (59 and 86 degrees F). Throw away any unused medication after the expiration date. NOTE: This sheet is a summary. It may not cover all possible information. If you have questions about this medicine, talk to your doctor, pharmacist, or health care provider.  2022 Elsevier/Gold Standard (2020-01-10 10:35:10)

## 2020-10-19 DIAGNOSIS — R253 Fasciculation: Secondary | ICD-10-CM | POA: Insufficient documentation

## 2020-10-19 DIAGNOSIS — E6609 Other obesity due to excess calories: Secondary | ICD-10-CM | POA: Insufficient documentation

## 2020-10-19 NOTE — Progress Notes (Signed)
Lab results look fine, her vitamin D level is already very good and doe snot need any increase. If she has no additonal questions or concerns since our visit can start alendronate 70 mg PO weekly, with a full glass of water then no food and remain upright for 30 minutes.

## 2020-10-20 ENCOUNTER — Other Ambulatory Visit: Payer: Self-pay | Admitting: *Deleted

## 2020-10-20 MED ORDER — ALENDRONATE SODIUM 70 MG PO TABS: 70.0000 mg | ORAL_TABLET | ORAL | 0 refills | Status: DC

## 2020-10-20 NOTE — Telephone Encounter (Signed)
-----   Message from Fuller Plan, MD sent at 10/19/2020  7:57 PM EDT ----- Lab results look fine, her vitamin D level is already very good and doe snot need any increase. If she has no additonal questions or concerns since our visit can start alendronate 70 mg PO weekly, with a full glass of water then no food and remain upright for 30 minutes.

## 2020-10-21 LAB — PROTEIN ELECTROPHORESIS, SERUM, WITH REFLEX
Albumin ELP: 4.2 g/dL (ref 3.8–4.8)
Alpha 1: 0.3 g/dL (ref 0.2–0.3)
Alpha 2: 1 g/dL — ABNORMAL HIGH (ref 0.5–0.9)
Beta 2: 0.3 g/dL (ref 0.2–0.5)
Beta Globulin: 0.4 g/dL (ref 0.4–0.6)
Gamma Globulin: 0.9 g/dL (ref 0.8–1.7)
Total Protein: 7.2 g/dL (ref 6.1–8.1)

## 2020-10-21 LAB — PARATHYROID HORMONE, INTACT (NO CA): PTH: 25 pg/mL (ref 16–77)

## 2020-10-21 LAB — VITAMIN D 25 HYDROXY (VIT D DEFICIENCY, FRACTURES): Vit D, 25-Hydroxy: 71 ng/mL (ref 30–100)

## 2020-10-21 LAB — IFE INTERPRETATION: Immunofix Electr Int: NOT DETECTED

## 2020-10-28 ENCOUNTER — Telehealth: Payer: Self-pay

## 2020-10-28 NOTE — Chronic Care Management (AMB) (Signed)
Chronic Care Management Pharmacy Assistant   Name: Vickie Brown  MRN: [id number] DOB: 1940-09-23   Reason for Encounter: Disease State/Diabetes   Recent office visits:  06-25-2020 Barb Merino, LPN. Medicare wellness visit.  06-15-022 Dorothyann Peng, MD. A1C= 6.7. Glucose= 107, BUN= 6, BUN/Creatinine ratio= 7.  10-09-2020 Dorothyann Peng, MD. Flu vaccine given. Abnormal UA.  Recent consult visits:  07-15-2020 Yates Decamp, MD (Cardiology). START HCTZ 12.5 mg daily. CHANGE losartan 50 mg TO 25 mg. STOP Nexium 40 mg.  08-27-2020 Yates Decamp, MD (Cardiology). Follow up.  10-14-2020 Fuller Plan, MD (Rheumatology). Alpha 2= 1.0. START alendronate 70 mg once weekly with full glass of water on an empty stomach.  Hospital visits:  None in previous 6 months  Medications: Outpatient Encounter Medications as of 10/28/2020  Medication Sig   acetaminophen (TYLENOL) 325 MG tablet Take 650 mg by mouth every 6 (six) hours as needed.   alendronate (FOSAMAX) 70 MG tablet Take 1 tablet (70 mg total) by mouth once a week. Take with a full glass of water on an empty stomach.   ascorbic acid (VITAMIN C) 500 MG tablet Take 500 mg by mouth daily.   aspirin 81 MG tablet Take 81 mg by mouth every evening.   benzonatate (TESSALON PERLES) 100 MG capsule Take 1 capsule (100 mg total) by mouth every 6 (six) hours as needed for cough.   BIOTIN PO Take by mouth daily.   Calcium Carbonate (CALCIUM 500 PO) Take by mouth.   cetirizine (ZYRTEC) 10 MG tablet Take 10 mg by mouth at bedtime.   Cholecalciferol (VITAMIN D3) 2000 UNITS TABS Take 1 tablet by mouth daily.   diclofenac sodium (VOLTAREN) 1 % GEL APPLY 2 GRAMS TOPICALLY TO KNEES FOUR TIMES DAILY AS NEEDED   fluticasone (FLONASE) 50 MCG/ACT nasal spray Place 1 spray into both nostrils at bedtime.   Homeopathic Products (OSCILLOCOCCINUM PO) Take by mouth.   hydrochlorothiazide (MICROZIDE) 12.5 MG capsule Take 1 capsule (12.5 mg total) by  mouth every morning.   ipratropium (ATROVENT) 0.03 % nasal spray Place 2 sprays into the nose 3 (three) times daily. (Patient taking differently: Place 2 sprays into the nose as needed.)   JANUVIA 100 MG tablet TAKE 1 TABLET(100 MG) BY MOUTH DAILY   losartan (COZAAR) 50 MG tablet Take 0.5 tablets (25 mg total) by mouth every morning.   Multiple Vitamins-Minerals (ALIVE ONCE DAILY WOMENS 50+ PO) Take 1 tablet by mouth daily.   Omega-3 Fatty Acids (FISH OIL PO) Take by mouth.   pravastatin (PRAVACHOL) 40 MG tablet TAKE 1 TABLET BY MOUTH DAILY   Probiotic Product (ALIGN) 4 MG CAPS Take 4 mg by mouth daily.   TURMERIC PO Take by mouth.   vitamin B-12 (CYANOCOBALAMIN) 500 MCG tablet Take 500 mcg by mouth daily.   No facility-administered encounter medications on file as of 10/28/2020.  Recent Relevant Labs: Lab Results  Component Value Date/Time   HGBA1C 6.4 (H) 10/09/2020 12:06 PM   HGBA1C 6.7 (H) 06/25/2020 11:17 AM   MICROALBUR 10 06/25/2020 12:47 PM   MICROALBUR 10 06/21/2019 12:56 PM    Kidney Function Lab Results  Component Value Date/Time   CREATININE 0.91 10/09/2020 12:06 PM   CREATININE 0.87 08/20/2020 01:10 PM   GFRNONAA 55 (L) 12/24/2019 04:22 PM   GFRAA 64 12/24/2019 04:22 PM      10-28-2020: 1st attempt left VM 10-29-2020: 2nd attempt left VM 10-31-2020: 3rd attempt left VM  Care Gaps: Yearly ophthalmology exam  overdue Covid booster overdue last completed 06-04-2020 PNA Vac overdue last completed 10-28-2015  Star Rating Drugs: Januvia 100 mg- Last filled 09-30-2020 90 DS [facility] Losartan 25 mg- Last filled 09-30-2020 90 DS [facility] Pravastatin 40 mg- Last filled 10-01-2020 90 DS [facility]   Huey Romans CMA Clinical Pharmacist Assistant [phone]

## 2020-11-27 ENCOUNTER — Ambulatory Visit: Payer: Medicare Other | Admitting: Cardiology

## 2020-12-10 ENCOUNTER — Ambulatory Visit: Payer: Medicare Other | Admitting: Cardiology

## 2020-12-22 DIAGNOSIS — E119 Type 2 diabetes mellitus without complications: Secondary | ICD-10-CM | POA: Diagnosis not present

## 2020-12-22 DIAGNOSIS — E1165 Type 2 diabetes mellitus with hyperglycemia: Secondary | ICD-10-CM | POA: Diagnosis not present

## 2020-12-29 ENCOUNTER — Other Ambulatory Visit: Payer: Self-pay | Admitting: Internal Medicine

## 2020-12-29 DIAGNOSIS — I10 Essential (primary) hypertension: Secondary | ICD-10-CM

## 2021-01-17 ENCOUNTER — Other Ambulatory Visit: Payer: Self-pay | Admitting: Cardiology

## 2021-01-17 DIAGNOSIS — R600 Localized edema: Secondary | ICD-10-CM

## 2021-01-17 DIAGNOSIS — I10 Essential (primary) hypertension: Secondary | ICD-10-CM

## 2021-01-20 ENCOUNTER — Telehealth: Payer: Self-pay

## 2021-01-20 NOTE — Progress Notes (Signed)
Chronic Care Management Pharmacy Assistant   Name: Vickie Brown  MRN: [id number] DOB: 1940-02-07  Reason for Encounter: Disease State/ Diabetes Adherence Call    Recent office visits: None  Recent consult visits: None    Hospital visits:  None in previous 6 months  Medications: Outpatient Encounter Medications as of 01/20/2021  Medication Sig   acetaminophen (TYLENOL) 325 MG tablet Take 650 mg by mouth every 6 (six) hours as needed.   alendronate (FOSAMAX) 70 MG tablet Take 1 tablet (70 mg total) by mouth once a week. Take with a full glass of water on an empty stomach.   ascorbic acid (VITAMIN C) 500 MG tablet Take 500 mg by mouth daily.   aspirin 81 MG tablet Take 81 mg by mouth every evening.   benzonatate (TESSALON PERLES) 100 MG capsule Take 1 capsule (100 mg total) by mouth every 6 (six) hours as needed for cough.   BIOTIN PO Take by mouth daily.   Calcium Carbonate (CALCIUM 500 PO) Take by mouth.   cetirizine (ZYRTEC) 10 MG tablet Take 10 mg by mouth at bedtime.   Cholecalciferol (VITAMIN D3) 2000 UNITS TABS Take 1 tablet by mouth daily.   diclofenac sodium (VOLTAREN) 1 % GEL APPLY 2 GRAMS TOPICALLY TO KNEES FOUR TIMES DAILY AS NEEDED   fluticasone (FLONASE) 50 MCG/ACT nasal spray Place 1 spray into both nostrils at bedtime.   Homeopathic Products (OSCILLOCOCCINUM PO) Take by mouth.   hydrochlorothiazide (MICROZIDE) 12.5 MG capsule TAKE 1 CAPSULE(12.5 MG) BY MOUTH EVERY MORNING   ipratropium (ATROVENT) 0.03 % nasal spray Place 2 sprays into the nose 3 (three) times daily. (Patient taking differently: Place 2 sprays into the nose as needed.)   JANUVIA 100 MG tablet TAKE 1 TABLET(100 MG) BY MOUTH DAILY   losartan (COZAAR) 50 MG tablet TAKE 1 TABLET(50 MG) BY MOUTH DAILY   Multiple Vitamins-Minerals (ALIVE ONCE DAILY WOMENS 50+ PO) Take 1 tablet by mouth daily.   Omega-3 Fatty Acids (FISH OIL PO) Take by mouth.   pravastatin (PRAVACHOL) 40 MG tablet TAKE 1 TABLET BY  MOUTH DAILY   Probiotic Product (ALIGN) 4 MG CAPS Take 4 mg by mouth daily.   TURMERIC PO Take by mouth.   vitamin B-12 (CYANOCOBALAMIN) 500 MCG tablet Take 500 mcg by mouth daily.   No facility-administered encounter medications on file as of 01/20/2021.   Recent Relevant Labs: Lab Results  Component Value Date/Time   HGBA1C 6.4 (H) 10/09/2020 12:06 PM   HGBA1C 6.7 (H) 06/25/2020 11:17 AM   MICROALBUR 10 06/25/2020 12:47 PM   MICROALBUR 10 06/21/2019 12:56 PM    Kidney Function Lab Results  Component Value Date/Time   CREATININE 0.91 10/09/2020 12:06 PM   CREATININE 0.87 08/20/2020 01:10 PM   GFRNONAA 55 (L) 12/24/2019 04:22 PM   GFRAA 64 12/24/2019 04:22 PM    01/20/2021 Called patient left message 01/21/2021 Called patient left message 01/23/2021 Called patient left message  Care Gaps: Last eye exam- overdue Last Annual Wellness Visit- 06/25/2020 Last Diabetic Foot Exam- 12/23/2020    Star Rating Drugs:  Medication:  Last Fill: Day Supply  Januvia 100 mg         9/20, 01/16/21            90 DS [facility] Losartan 25 mg           6/22, 9/20           90 DS [facility] Pravastatin 40 mg  9/21, 01/17/21     90 DS [facility] Clinical Pharmacist Assistant [phone]

## 2021-01-21 ENCOUNTER — Telehealth: Payer: Self-pay

## 2021-01-21 NOTE — Progress Notes (Signed)
A user error has taken place: encounter opened in error, closed for administrative reasons.

## 2021-01-27 ENCOUNTER — Telehealth: Payer: Self-pay

## 2021-01-27 NOTE — Telephone Encounter (Signed)
Left pt vm. Pt states she is having pain internally, pt notified on vm. Give office a call back to get an apt with RG or JM.

## 2021-02-05 ENCOUNTER — Other Ambulatory Visit: Payer: Self-pay

## 2021-02-05 ENCOUNTER — Ambulatory Visit (INDEPENDENT_AMBULATORY_CARE_PROVIDER_SITE_OTHER): Payer: Medicare Other | Admitting: Internal Medicine

## 2021-02-05 ENCOUNTER — Encounter: Payer: Self-pay | Admitting: Internal Medicine

## 2021-02-05 VITALS — BP 118/80 | HR 87 | Temp 98.2°F | Ht 58.8 in | Wt 148.2 lb

## 2021-02-05 DIAGNOSIS — E66811 Obesity, class 1: Secondary | ICD-10-CM

## 2021-02-05 DIAGNOSIS — M79641 Pain in right hand: Secondary | ICD-10-CM | POA: Diagnosis not present

## 2021-02-05 DIAGNOSIS — M79642 Pain in left hand: Secondary | ICD-10-CM

## 2021-02-05 DIAGNOSIS — E6609 Other obesity due to excess calories: Secondary | ICD-10-CM

## 2021-02-05 DIAGNOSIS — M542 Cervicalgia: Secondary | ICD-10-CM | POA: Diagnosis not present

## 2021-02-05 DIAGNOSIS — Z23 Encounter for immunization: Secondary | ICD-10-CM | POA: Diagnosis not present

## 2021-02-05 DIAGNOSIS — I700 Atherosclerosis of aorta: Secondary | ICD-10-CM | POA: Diagnosis not present

## 2021-02-05 DIAGNOSIS — Z6830 Body mass index (BMI) 30.0-30.9, adult: Secondary | ICD-10-CM

## 2021-02-05 DIAGNOSIS — N182 Chronic kidney disease, stage 2 (mild): Secondary | ICD-10-CM

## 2021-02-05 DIAGNOSIS — I129 Hypertensive chronic kidney disease with stage 1 through stage 4 chronic kidney disease, or unspecified chronic kidney disease: Secondary | ICD-10-CM | POA: Diagnosis not present

## 2021-02-05 DIAGNOSIS — E1122 Type 2 diabetes mellitus with diabetic chronic kidney disease: Secondary | ICD-10-CM | POA: Diagnosis not present

## 2021-02-05 LAB — POCT URINALYSIS DIPSTICK
Bilirubin, UA: NEGATIVE
Glucose, UA: NEGATIVE
Ketones, UA: NEGATIVE
Leukocytes, UA: NEGATIVE
Nitrite, UA: NEGATIVE
Protein, UA: NEGATIVE
Spec Grav, UA: 1.015 (ref 1.010–1.025)
Urobilinogen, UA: 0.2 E.U./dL
pH, UA: 7 (ref 5.0–8.0)

## 2021-02-05 MED ORDER — BACLOFEN 10 MG PO TABS: 10.0000 mg | ORAL_TABLET | Freq: Two times a day (BID) | ORAL | 1 refills | Status: DC

## 2021-02-05 NOTE — Progress Notes (Signed)
Rich Brave [place],acting as a Education administrator for Maximino Greenland, MD.,have documented all relevant documentation on the behalf of Maximino Greenland, MD,as directed by  Maximino Greenland, MD while in the presence of Maximino Greenland, MD.  This visit occurred during the SARS-CoV-2 public health emergency.  Safety protocols were in place, including screening questions prior to the visit, additional usage of staff PPE, and extensive cleaning of exam room while observing appropriate contact time as indicated for disinfecting solutions.  Subjective:     Patient ID: Vickie Brown , female    DOB: 1941-01-08 , 81 y.o.   MRN: [id number]   Chief Complaint  Patient presents with   Diabetes   Hypertension    HPI  She is here today for a diabetes and BP check.  She reports compliance with meds. She is upset because she called two weeks ago with neck pain and no one called her back.  She admits she only called once.    Diabetes She presents for her follow-up diabetic visit. She has type 2 diabetes mellitus. Her disease course has been stable. There are no hypoglycemic associated symptoms. Pertinent negatives for hypoglycemia include no headaches. Pertinent negatives for diabetes include no blurred vision, no chest pain, no polydipsia, no polyphagia and no polyuria. There are no hypoglycemic complications. Risk factors for coronary artery disease include diabetes mellitus, dyslipidemia, hypertension and post-menopausal. She is following a diabetic diet. Her breakfast blood glucose is taken between 8-9 am. Her breakfast blood glucose range is generally 90-110 mg/dl.  Hypertension This is a chronic problem. The current episode started more than 1 year ago. The problem has been gradually improving since onset. Associated symptoms include neck pain. Pertinent negatives include no blurred vision, chest pain, headaches, palpitations or shortness of breath.    Past Medical History:  Diagnosis Date   Chronic headaches     Diverticulitis    Hyperlipidemia    Hypertension    Knee pain    Type 2 diabetes mellitus ([place])      Family History  Problem Relation Age of Onset   Cervical cancer Mother    Cancer Mother    Diabetes Father    Diabetes Sister    Diabetes Brother    Other Brother        killed   Breast cancer Neg Hx      Current Outpatient Medications:    acetaminophen (TYLENOL) 325 MG tablet, Take 650 mg by mouth every 6 (six) hours as needed., Disp: , Rfl:    alendronate (FOSAMAX) 70 MG tablet, Take 1 tablet (70 mg total) by mouth once a week. Take with a full glass of water on an empty stomach., Disp: 12 tablet, Rfl: 0   ascorbic acid (VITAMIN C) 500 MG tablet, Take 500 mg by mouth daily., Disp: , Rfl:    aspirin 81 MG tablet, Take 81 mg by mouth every evening., Disp: , Rfl:    baclofen (LIORESAL) 10 MG tablet, Take 1 tablet (10 mg total) by mouth 2 (two) times daily., Disp: 60 tablet, Rfl: 1   BIOTIN PO, Take by mouth daily., Disp: , Rfl:    Calcium Carbonate (CALCIUM 500 PO), Take by mouth., Disp: , Rfl:    cetirizine (ZYRTEC) 10 MG tablet, Take 10 mg by mouth at bedtime., Disp: , Rfl:    Cholecalciferol (VITAMIN D3) 2000 UNITS TABS, Take 1 tablet by mouth daily., Disp: , Rfl:    diclofenac sodium (VOLTAREN) 1 % GEL, APPLY 2  GRAMS TOPICALLY TO KNEES FOUR TIMES DAILY AS NEEDED, Disp: 100 g, Rfl: 1   fluticasone (FLONASE) 50 MCG/ACT nasal spray, Place 1 spray into both nostrils at bedtime., Disp: 16 g, Rfl: 2   hydrochlorothiazide (MICROZIDE) 12.5 MG capsule, TAKE 1 CAPSULE(12.5 MG) BY MOUTH EVERY MORNING, Disp: 90 capsule, Rfl: 1   ipratropium (ATROVENT) 0.03 % nasal spray, Place 2 sprays into the nose 3 (three) times daily. (Patient taking differently: Place 2 sprays into the nose as needed.), Disp: 30 mL, Rfl: 2   JANUVIA 100 MG tablet, TAKE 1 TABLET(100 MG) BY MOUTH DAILY, Disp: 90 tablet, Rfl: 1   losartan (COZAAR) 50 MG tablet, TAKE 1 TABLET(50 MG) BY MOUTH DAILY, Disp: 90 tablet, Rfl:  1   Multiple Vitamins-Minerals (ALIVE ONCE DAILY WOMENS 50+ PO), Take 1 tablet by mouth daily., Disp: , Rfl:    Omega-3 Fatty Acids (FISH OIL PO), Take by mouth., Disp: , Rfl:    pravastatin (PRAVACHOL) 40 MG tablet, TAKE 1 TABLET BY MOUTH DAILY, Disp: 90 tablet, Rfl: 1   Probiotic Product (ALIGN) 4 MG CAPS, Take 4 mg by mouth daily., Disp: , Rfl:    TURMERIC PO, Take by mouth., Disp: , Rfl:    vitamin B-12 (CYANOCOBALAMIN) 500 MCG tablet, Take 500 mcg by mouth daily., Disp: , Rfl:    benzonatate (TESSALON PERLES) 100 MG capsule, Take 1 capsule (100 mg total) by mouth every 6 (six) hours as needed for cough. (Patient not taking: Reported on 02/05/2021), Disp: 30 capsule, Rfl: 0   Homeopathic Products (OSCILLOCOCCINUM PO), Take by mouth. (Patient not taking: Reported on 02/05/2021), Disp: , Rfl:    No Known Allergies   Review of Systems  Constitutional: Negative.   Eyes:  Negative for blurred vision.  Respiratory: Negative.  Negative for shortness of breath.   Cardiovascular: Negative.  Negative for chest pain and palpitations.  Gastrointestinal: Negative.   Endocrine: Negative for polydipsia, polyphagia and polyuria.  Musculoskeletal:  Positive for arthralgias and neck pain.       She c/o neck pain. Denies fall/trauma. Denies paresthesias and UE weakness. There is pain with movement. Not sure what triggered her sx.  Also c/o b/l hand pain. Hands are stiff upon awakening. States it sometimes makes it difficult to perform day to day tasks.   Neurological: Negative.  Negative for light-headedness and headaches.  Psychiatric/Behavioral: Negative.      Today's Vitals   02/05/21 1415  BP: 118/80  Pulse: 87  Temp: 98.2 F (36.8 C)  Weight: 148 lb 3.2 oz (67.2 kg)  Height: 4' 10.8" (1.494 m)  PainSc: 0-No pain   Body mass index is 30.14 kg/m.  Wt Readings from Last 3 Encounters:  02/05/21 148 lb 3.2 oz (67.2 kg)  10/14/20 149 lb (67.6 kg)  10/09/20 150 lb 9.6 oz (68.3 kg)      Objective:  Physical Exam Vitals and nursing note reviewed.  Constitutional:      Appearance: Normal appearance.  HENT:     Head: Normocephalic and atraumatic.     Nose:     Comments: Masked     Mouth/Throat:     Comments: Masked  Eyes:     Extraocular Movements: Extraocular movements intact.  Cardiovascular:     Rate and Rhythm: Normal rate and regular rhythm.     Heart sounds: Normal heart sounds.  Pulmonary:     Effort: Pulmonary effort is normal.     Breath sounds: Normal breath sounds.  Musculoskeletal:  Cervical back: Tenderness present.  Skin:    General: Skin is warm.  Neurological:     General: No focal deficit present.     Mental Status: She is alert.  Psychiatric:        Mood and Affect: Mood normal.        Behavior: Behavior normal.     Assessment And Plan:     1. Type 2 diabetes mellitus with stage 2 chronic kidney disease, without long-term current use of insulin (HCC) Comments: Chronic, I will check labs as below. She is encouraged to limit her intake of sweetened beverages and to avoid artificial sweeteners.  - CMP14+EGFR - Hemoglobin A1c - POCT Urinalysis Dipstick (81002) - Microalbumin / Creatinine Urine Ratio  2. Hypertensive nephropathy Comments: Chronic, well controlled. Advised to follow low sodium diet. No med changes.   3. Cervicalgia Comments: She is advised to perform stretching exercises and apply Vicks to base of neck/shoulders. We also discussed proper posture, esp while looking at phone.  - Ambulatory referral to Orthopedic Surgery - baclofen (LIORESAL) 10 MG tablet; Take 1 tablet (10 mg total) by mouth 2 (two) times daily.  Dispense: 60 tablet; Refill: 1  4. Pain in both hands Comments: Squeeze test pos. I will check an arthritis panel. Pt advised that her sx are likely due to OA. Encouraged to follow anti-inflammatory diet.  - Ambulatory referral to Orthopedic Surgery - ANA, IFA (with reflex) - CYCLIC CITRUL PEPTIDE ANTIBODY,  IGG/IGA - Rheumatoid factor - Sedimentation rate - Uric acid  5. Atherosclerosis of aorta ([place]) Comments: Chronic, encouraged to follow heart healthy lifestyle w/ clean eating, regular exercise and stress management.  Advised to c/w statin therapy.   6. Class 1 obesity due to excess calories with serious comorbidity and body mass index (BMI) of 30.0 to 30.9 in adult Comments: Her BMI is acceptable for her demographic. Encouraged to gradually increase her daily activity.   7. Immunization due - Pneumococcal conjugate vaccine 20-valent (Prevnar-20)   Patient was given opportunity to ask questions. Patient verbalized understanding of the plan and was able to repeat key elements of the plan. All questions were answered to their satisfaction.   I, Maximino Greenland, MD, have reviewed all documentation for this visit. The documentation on 02/05/21 for the exam, diagnosis, procedures, and orders are all accurate and complete.   IF YOU HAVE BEEN REFERRED TO A SPECIALIST, IT MAY TAKE 1-2 WEEKS TO SCHEDULE/PROCESS THE REFERRAL. IF YOU HAVE NOT HEARD FROM US/SPECIALIST IN TWO WEEKS, PLEASE GIVE [place] A CALL AT [phone].   THE PATIENT IS ENCOURAGED TO PRACTICE SOCIAL DISTANCING DUE TO THE COVID-19 PANDEMIC.

## 2021-02-05 NOTE — Patient Instructions (Signed)

## 2021-02-06 LAB — MICROALBUMIN / CREATININE URINE RATIO
Creatinine, Urine: 164.4 mg/dL
Microalb/Creat Ratio: 6 mg/g creat (ref 0–29)
Microalbumin, Urine: 10 ug/mL

## 2021-02-11 LAB — CMP14+EGFR
ALT: 18 IU/L (ref 0–32)
AST: 26 IU/L (ref 0–40)
Albumin/Globulin Ratio: 2 (ref 1.2–2.2)
Albumin: 4.6 g/dL (ref 3.6–4.6)
Alkaline Phosphatase: 58 IU/L (ref 44–121)
BUN/Creatinine Ratio: 9 — ABNORMAL LOW (ref 12–28)
BUN: 9 mg/dL (ref 8–27)
Bilirubin Total: 0.2 mg/dL (ref 0.0–1.2)
CO2: 29 mmol/L (ref 20–29)
Calcium: 10 mg/dL (ref 8.7–10.3)
Chloride: 96 mmol/L (ref 96–106)
Creatinine, Ser: 1.05 mg/dL — ABNORMAL HIGH (ref 0.57–1.00)
Globulin, Total: 2.3 g/dL (ref 1.5–4.5)
Glucose: 112 mg/dL — ABNORMAL HIGH (ref 70–99)
Potassium: 4.1 mmol/L (ref 3.5–5.2)
Sodium: 140 mmol/L (ref 134–144)
Total Protein: 6.9 g/dL (ref 6.0–8.5)
eGFR: 53 mL/min/{1.73_m2} — ABNORMAL LOW (ref >59)

## 2021-02-11 LAB — URIC ACID: Uric Acid: 4.7 mg/dL (ref 3.1–7.9)

## 2021-02-11 LAB — HEMOGLOBIN A1C
Est. average glucose Bld gHb Est-mCnc: 137 mg/dL
Hgb A1c MFr Bld: 6.4 % — ABNORMAL HIGH (ref 4.8–5.6)

## 2021-02-11 LAB — ANTINUCLEAR ANTIBODIES, IFA: ANA Titer 1: NEGATIVE

## 2021-02-11 LAB — SEDIMENTATION RATE: Sed Rate: 12 mm/hr (ref 0–40)

## 2021-02-11 LAB — RHEUMATOID FACTOR: Rheumatoid fact SerPl-aCnc: 17.2 IU/mL — ABNORMAL HIGH (ref <14.0)

## 2021-02-11 LAB — CYCLIC CITRUL PEPTIDE ANTIBODY, IGG/IGA: Cyclic Citrullin Peptide Ab: 3 units (ref 0–19)

## 2021-02-13 ENCOUNTER — Ambulatory Visit: Payer: Medicare Other | Admitting: Orthopaedic Surgery

## 2021-02-18 ENCOUNTER — Telehealth: Payer: Self-pay

## 2021-02-18 NOTE — Chronic Care Management (AMB) (Signed)
° ° °  Called [name], No answer, left message of appointment on 02-25-2021 at 11:00 via telephone visit with Cherylin Mylar, Pharm D. Notified to have all medications, supplements, blood pressure and/or blood sugar logs available during appointment and to return call if need to reschedule.  Care Gaps: Last eye exam- overdue Last Annual Wellness Visit- 06/25/2020 Last Diabetic Foot Exam- 12/23/2020  Covid booster overdue  Star Rating Drug: Pravastatin 40 mg- Last filled 01-17-2021 90 DS [facility] Losartan 25 mg- Last filled 09-30-2020 90 DS [facility] Januvia 100 mg- Last filled 01-16-2021 [facility]  Any gaps in medications fill history?  Huey Romans [facility] Clinical Pharmacist Assistant [phone]

## 2021-02-24 ENCOUNTER — Ambulatory Visit: Payer: Medicare Other | Admitting: Orthopaedic Surgery

## 2021-02-25 ENCOUNTER — Ambulatory Visit (INDEPENDENT_AMBULATORY_CARE_PROVIDER_SITE_OTHER): Payer: Medicare Other

## 2021-02-25 DIAGNOSIS — E1122 Type 2 diabetes mellitus with diabetic chronic kidney disease: Secondary | ICD-10-CM

## 2021-02-25 DIAGNOSIS — E559 Vitamin D deficiency, unspecified: Secondary | ICD-10-CM

## 2021-02-25 DIAGNOSIS — N182 Chronic kidney disease, stage 2 (mild): Secondary | ICD-10-CM

## 2021-02-25 NOTE — Progress Notes (Signed)
Chronic Care Management Pharmacy Note  03/10/2021 Name:  Vickie Brown MRN:  [id number] DOB:  [birth year]  Summary: Patient reports that she is taking her medication everyday   Recommendations/Changes made from today's visit: Recommend patient have vitamin D lab completed completed during next office visit. Recommend patient receive COVID-19 booster.   Plan: Patient to have lab completed during next office visit. [name] is going to get the booster shot at [facility].     Subjective: Vickie Brown is an 81 y.o. year old female who is a primary patient of Glendale Chard, MD.  The CCM team was consulted for assistance with disease management and care coordination needs.  Patient reports that she is still in bed. She does not get up early unless she has too. Once she gets up she watches TV.   Engaged with patient by telephone for follow up visit in response to provider referral for pharmacy case management and/or care coordination services.   Consent to Services:  The patient was given information about Chronic Care Management services, agreed to services, and gave verbal consent prior to initiation of services.  Please see initial visit note for detailed documentation.   Patient Care Team: Glendale Chard, MD as PCP - General (Internal Medicine) Cyril Mourning, [facility] (Inactive) (Pharmacist)  Recent office visits: 02/05/2021 PCP OV  Recent consult visits: 10/14/2020 Rheumatology [facility] visits: None in previous 6 months   Objective:  Lab Results  Component Value Date   CREATININE 1.05 (H) 02/05/2021   BUN 9 02/05/2021   EGFR 53 (L) 02/05/2021   GFRNONAA 55 (L) 12/24/2019   GFRAA 64 12/24/2019   NA 140 02/05/2021   K 4.1 02/05/2021   CALCIUM 10.0 02/05/2021   CO2 29 02/05/2021   GLUCOSE 112 (H) 02/05/2021    Lab Results  Component Value Date/Time   HGBA1C 6.4 (H) 02/05/2021 02:39 PM   HGBA1C 6.4 (H) 10/09/2020 12:06 PM   MICROALBUR 10 06/25/2020 12:47  PM   MICROALBUR 10 06/21/2019 12:56 PM    Last diabetic Eye exam:  Lab Results  Component Value Date/Time   HMDIABEYEEXA Retinopathy (A) 06/20/2019 12:00 AM    Last diabetic Foot exam: No results found for: HMDIABFOOTEX   Lab Results  Component Value Date   CHOL 150 10/09/2020   HDL 63 10/09/2020   LDLCALC 66 10/09/2020   TRIG 120 10/09/2020   CHOLHDL 2.4 10/09/2020    Hepatic Function Latest Ref Rng & Units 02/05/2021 10/14/2020 10/09/2020  Total Protein 6.0 - 8.5 g/dL 6.9 7.2 7.0  Albumin 3.6 - 4.6 g/dL 4.6 - 4.7(H)  AST 0 - 40 IU/L 26 - 27  ALT 0 - 32 IU/L 18 - 19  Alk Phosphatase 44 - 121 IU/L 58 - 53  Total Bilirubin 0.0 - 1.2 mg/dL 0.2 - 0.3    Lab Results  Component Value Date/Time   TSH 2.140 03/25/2020 12:24 PM   TSH 1.810 10/12/2018 04:26 PM    CBC Latest Ref Rng & Units 10/09/2020 10/03/2019 07/25/2016  WBC 3.4 - 10.8 x10E3/uL 4.0 4.6 5.0  Hemoglobin 11.1 - 15.9 g/dL 12.1 12.7 11.8(L)  Hematocrit 34.0 - 46.6 % 35.9 39.1 35.6(L)  Platelets 150 - 450 x10E3/uL 301 250 245    Lab Results  Component Value Date/Time   VD25OH 71 10/14/2020 02:23 PM   VD25OH 56.7 10/03/2019 02:17 PM    Clinical ASCVD: Yes  The ASCVD Risk score (Arnett DK, et al., 2019) failed to calculate  for the following reasons:   The 2019 ASCVD risk score is only valid for ages 74 to 75    Depression screen PHQ 2/9 06/25/2020 06/21/2019 09/28/2018  Decreased Interest 0 0 1  Down, Depressed, Hopeless 0 0 0  PHQ - 2 Score 0 0 1  Altered sleeping - 0 3  Tired, decreased energy - 0 3  Change in appetite - 0 0  Feeling bad or failure about yourself  - 0 0  Trouble concentrating - 0 0  Moving slowly or fidgety/restless - 0 0  Suicidal thoughts - 0 0  PHQ-9 Score - 0 7  Difficult doing work/chores - Not difficult at all Not difficult at all      Social History   Tobacco Use  Smoking Status Never  Smokeless Tobacco Never   BP Readings from Last 3 Encounters:  02/05/21 118/80  10/14/20  (!) 111/58  10/09/20 130/70   Pulse Readings from Last 3 Encounters:  02/05/21 87  10/14/20 82  08/27/20 73   Wt Readings from Last 3 Encounters:  02/05/21 148 lb 3.2 oz (67.2 kg)  10/14/20 149 lb (67.6 kg)  10/09/20 150 lb 9.6 oz (68.3 kg)   BMI Readings from Last 3 Encounters:  02/05/21 30.14 kg/m  10/14/20 28.62 kg/m  10/09/20 30.42 kg/m    Assessment/Interventions: Review of patient past medical history, allergies, medications, health status, including review of consultants reports, laboratory and other test data, was performed as part of comprehensive evaluation and provision of chronic care management services.   SDOH:  (Social Determinants of Health) assessments and interventions performed: No  SDOH Screenings   Alcohol Screen: Not on file  Depression (PHQ2-9): Low Risk    PHQ-2 Score: 0  Financial Resource Strain: Medium Risk   Difficulty of Paying Living Expenses: Somewhat hard  Food Insecurity: Food Insecurity Present   Worried About Charity fundraiser in the Last Year: Sometimes true   Arboriculturist in the Last Year: Sometimes true  Housing: Not on file  Physical Activity: Inactive   Days of Exercise per Week: 0 days   Minutes of Exercise per Session: 0 min  Social Connections: Not on file  Stress: No Stress Concern Present   Feeling of Stress : Not at all  Tobacco Use: Low Risk    Smoking Tobacco Use: Never   Smokeless Tobacco Use: Never   Passive Exposure: Not on file  Transportation Needs: No Transportation Needs   Lack of Transportation (Medical): No   Lack of Transportation (Non-Medical): No    CCM Care Plan  No Known Allergies  Medications Reviewed Today     Reviewed by Mayford Knife, RPH (Pharmacist) on 02/25/21 at 1118  Med List Status: <None>   Medication Order Taking? Sig Documenting Provider Last Dose Status Informant  acetaminophen (TYLENOL) 325 MG tablet [id number] Yes Take 650 mg by mouth every 6 (six) hours as needed.  [provider]  Active   alendronate (FOSAMAX) 70 MG tablet [id number] Yes Take 1 tablet (70 mg total) by mouth once a week. Take with a full glass of water on an empty stomach. Collier Salina, MD  Active   ascorbic acid (VITAMIN C) 500 MG tablet [id number] Yes Take 500 mg by mouth daily. [provider]  Active Self  aspirin 81 MG tablet [id number] Yes Take 81 mg by mouth every evening. [provider]  Active Self  baclofen (LIORESAL) 10 MG tablet [id number] Yes Take 1 tablet (10  mg total) by mouth 2 (two) times daily. Glendale Chard, MD  Active   benzonatate (TESSALON PERLES) 100 MG capsule [id number] Yes Take 1 capsule (100 mg total) by mouth every 6 (six) hours as needed for cough. Minette Brine, FNP  Active   BIOTIN PO [id number] Yes Take by mouth daily. [provider]  Active Self  Calcium Carbonate (CALCIUM 500 PO) [id number] Yes Take by mouth. [provider]  Active Self  cetirizine (ZYRTEC) 10 MG tablet [id number] Yes Take 10 mg by mouth at bedtime. [provider]  Active Self  Cholecalciferol (VITAMIN D3) 2000 UNITS TABS [id number] Yes Take 1 tablet by mouth daily. [provider]  Active Self    Discontinued 02/25/21 1116 (Patient has not taken in last 30 days)            Med Note>> Mayford Knife, [facility]   02/25/2021 11:16 AM Patient reports that is not working     fluticasone (FLONASE) 50 MCG/ACT nasal spray [id number] Yes Place 1 spray into both nostrils at bedtime. Angelica Ran, MD  Active   Homeopathic Products (OSCILLOCOCCINUM PO) [id number]  Take by mouth.  Patient not taking: Reported on 02/05/2021   [provider]  Active   hydrochlorothiazide (MICROZIDE) 12.5 MG capsule [id number] Yes TAKE 1 CAPSULE(12.5 MG) BY MOUTH EVERY MORNING Adrian Prows, MD  Active   ipratropium (ATROVENT) 0.03 % nasal spray [id number] Yes Place 2 sprays into the nose 3 (three) times daily.  Patient taking differently: Place 2  sprays into the nose as needed.   Minette Brine, FNP  Active   JANUVIA 100 MG tablet [id number] Yes TAKE 1 TABLET(100 MG) BY MOUTH DAILY Glendale Chard, MD  Active   losartan (COZAAR) 50 MG tablet [id number] Yes TAKE 1 TABLET(50 MG) BY MOUTH DAILY Glendale Chard, MD  Active   Multiple Vitamins-Minerals (ALIVE ONCE DAILY WOMENS 50+ PO) [id number] Yes Take 1 tablet by mouth daily. [provider]  Active Self  Omega-3 Fatty Acids (FISH OIL PO) [id number] Yes Take by mouth. [provider]  Active   pravastatin (PRAVACHOL) 40 MG tablet [id number] Yes TAKE 1 TABLET BY MOUTH DAILY Glendale Chard, MD  Active   Probiotic Product (ALIGN) 4 MG CAPS [id number] Yes Take 4 mg by mouth daily. [provider]  Active Self  TURMERIC PO [id number] Yes Take by mouth. [provider]  Active   vitamin B-12 (CYANOCOBALAMIN) 500 MCG tablet [id number] Yes Take 500 mcg by mouth daily. [provider]  Active Self            Patient Active Problem List   Diagnosis Date Noted   Atherosclerosis of aorta ([place]) 02/05/2021   Fasciculations 10/19/2020   Class 1 obesity due to excess calories with serious comorbidity and body mass index (BMI) of 30.0 to 30.9 in adult 10/19/2020   Age-related osteoporosis without current pathological fracture 10/14/2020   Vitamin D deficiency 10/14/2020   Generalized osteoarthritis 10/14/2020   Type 2 diabetes mellitus with stage 2 chronic kidney disease, without long-term current use of insulin ([place]) 12/22/2017   Chronic renal disease, stage II 12/22/2017   Hypertensive nephropathy 12/22/2017   Dermatitis 12/22/2017   Dyspnea on exertion 06/22/2010   Myalgia 06/22/2010    Immunization History  Administered Date(s) Administered   DTaP 10/28/2015   Fluad Quad(high Dose 65+) 10/19/2018, 10/19/2019, 10/09/2020   Influenza Whole 10/11/2009   Influenza, High Dose Seasonal PF 10/21/2017   Influenza-Unspecified 10/19/2018   PFIZER(Purple  Top)SARS-COV-2 Vaccination  02/19/2019, 03/16/2019, 12/11/2019, 06/04/2020   PNEUMOCOCCAL CONJUGATE-20 02/05/2021   Pneumococcal-Unspecified 03/28/2013, 10/28/2015   Zoster Recombinat (Shingrix) 05/13/2016, 08/18/2016    Conditions to be addressed/monitored:  Diabetes and Vitamin D Deficiency   Care Plan : [place]  Updates made by Mayford Knife, RPH since 03/10/2021 12:00 AM     Problem: DM II , Vitamin D Deficiency      Goal: Patient Stated   Note:     Current Barriers:  Unable to independently monitor therapeutic efficacy  Pharmacist Clinical Goal(s):  Patient will achieve adherence to monitoring guidelines and medication adherence to achieve therapeutic efficacy through collaboration with PharmD and provider.   Interventions: 1:1 collaboration with Glendale Chard, MD regarding development and update of comprehensive plan of care as evidenced by provider attestation and co-signature Inter-disciplinary care team collaboration (see longitudinal plan of care) Comprehensive medication review performed; medication list updated in electronic medical record  Diabetes (A1c goal <8%) -Controlled -Current medications: Januvia 100 mg tablet once per day Appropriate, Effective, Safe, Accessible -Current home glucose readings: she is checking her BS everyday  fasting glucose:114-115 126  -Denies hypoglycemic/hyperglycemic symptoms -Current meal patterns: she is not currently eating any special diet. She does like to go to [employer] 3-4 months, when she can afford it.  drinks: she drinks water all day and all night, she has one ginger ale a day  -Current exercise: walking around doing work -Counseled to check feet daily and get yearly eye exams, she does not walk barefoot at all  -Recommended to continue current medication  Vitamin D Deficiency (Goal: > 30 ng/mL) -Controlled -Current treatment  Vitamin D3- Taking 1 tablet by mouth daily Appropriate, Effective,  Safe, Accessible -Recommended to continue current medication  Patient Goals/Self-Care Activities Patient will:  - take medications as prescribed as evidenced by patient report and record review  Follow Up Plan: The patient has been provided with contact information for the care management team and has been advised to call with any health related questions or concerns.       Medication Assistance: None required.  Patient affirms current coverage meets needs.  Compliance/Adherence/Medication fill history: Care Gaps: Ophthalmology Exam - completed in September  COVID-19 Vaccine - patient reports that she is going to get the [place] booster when she remembers.  Foot Exam   Star-Rating Drugs: Januvia 100 mg tablet  Losartan 50 mg tablet  Pravastatin 40 mg tablet   Patient's preferred pharmacy is:  [facility] [place], [address] AT [address] [facility] [address] Phone: [phone] Fax: [phone]  Uses pill box? Yes Pt endorses 90% compliance  We discussed: Benefits of medication synchronization, packaging and delivery as well as enhanced pharmacist oversight with [employer]. Patient decided to: Continue current medication management strategy  Care Plan and Follow Up Patient Decision:  Patient agrees to Care Plan and Follow-up.  Plan: The patient has been provided with contact information for the care management team and has been advised to call with any health related questions or concerns.   Orlando Penner, CPP, PharmD Clinical Pharmacist Practitioner [facility] [phone]

## 2021-03-10 DIAGNOSIS — N182 Chronic kidney disease, stage 2 (mild): Secondary | ICD-10-CM

## 2021-03-10 DIAGNOSIS — E1122 Type 2 diabetes mellitus with diabetic chronic kidney disease: Secondary | ICD-10-CM

## 2021-03-10 NOTE — Patient Instructions (Addendum)
Visit Information It was great speaking with you today!  Please let me know if you have any questions about our visit.   Goals Addressed             This Visit's Progress    Manage My Medicine       Timeframe:  Long-Range Goal Priority:  High Start Date:                             Expected End Date:                       Follow Up Date 09/08/2021    - call for medicine refill 2 or 3 days before it runs out - call if I am sick and can't take my medicine - keep a list of all the medicines I take; vitamins and herbals too - use an alarm clock or phone to remind me to take my medicine    Why is this important?   These steps will help you keep on track with your medicines.   Notes:  Please Call if you have any questions         Patient Care Plan: CCM Pharmacy Care Plan     Problem Identified: DM II , Vitamin D Deficiency      Goal: Patient Stated   Note:     Current Barriers:  Unable to independently monitor therapeutic efficacy  Pharmacist Clinical Goal(s):  Patient will achieve adherence to monitoring guidelines and medication adherence to achieve therapeutic efficacy through collaboration with PharmD and provider.   Interventions: 1:1 collaboration with Dorothyann Peng, MD regarding development and update of comprehensive plan of care as evidenced by provider attestation and co-signature Inter-disciplinary care team collaboration (see longitudinal plan of care) Comprehensive medication review performed; medication list updated in electronic medical record  Diabetes (A1c goal <8%) -Controlled -Current medications: Januvia 100 mg tablet once per day Appropriate, Effective, Safe, Accessible -Current home glucose readings: she is checking her BS everyday  fasting glucose:114-115 126  -Denies hypoglycemic/hyperglycemic symptoms -Current meal patterns: she is not currently eating any special diet. She does like to go to [employer] 3-4 months, when she can afford  it.  drinks: she drinks water all day and all night, she has one ginger ale a day  -Current exercise: walking around doing work -Counseled to check feet daily and get yearly eye exams, she does not walk barefoot at all  -Recommended to continue current medication  Vitamin D Deficiency (Goal: > 30 ng/mL) -Controlled -Current treatment  Vitamin D3- Taking 1 tablet by mouth daily Appropriate, Effective, Safe, Accessible -Recommended to continue current medication  Patient Goals/Self-Care Activities Patient will:  - take medications as prescribed as evidenced by patient report and record review  Follow Up Plan: The patient has been provided with contact information for the care management team and has been advised to call with any health related questions or concerns.       Patient agreed to services and verbal consent obtained.   The patient verbalized understanding of instructions, educational materials, and care plan provided today and agreed to receive a mailed copy of patient instructions, educational materials, and care plan.   Cherylin Mylar, PharmD Clinical Pharmacist [facility] [phone]

## 2021-03-27 DIAGNOSIS — E119 Type 2 diabetes mellitus without complications: Secondary | ICD-10-CM | POA: Diagnosis not present

## 2021-03-27 DIAGNOSIS — E1165 Type 2 diabetes mellitus with hyperglycemia: Secondary | ICD-10-CM | POA: Diagnosis not present

## 2021-03-29 ENCOUNTER — Other Ambulatory Visit: Payer: Self-pay | Admitting: Internal Medicine

## 2021-04-14 ENCOUNTER — Ambulatory Visit: Payer: Medicare Other | Admitting: Internal Medicine

## 2021-04-24 ENCOUNTER — Other Ambulatory Visit: Payer: Self-pay | Admitting: Internal Medicine

## 2021-04-24 NOTE — Telephone Encounter (Signed)
Next Visit: 06/09/2021 ? ?Last Visit: 10/14/2020 ? ?Last Fill: 10/20/2020 ? ?DX: Age-related osteoporosis without current pathological fracture ? ?Current Dose per office note 10/14/2020: alendronate 70 mg PO weekly ? ?Labs: 02/05/2021 Glucose 112, Creat. 1.05, GFR 53 BUN/Creat. 9  ? ?Okay to refill Fosamax?  ?

## 2021-05-04 ENCOUNTER — Ambulatory Visit (INDEPENDENT_AMBULATORY_CARE_PROVIDER_SITE_OTHER): Payer: Medicare Other | Admitting: Internal Medicine

## 2021-05-04 ENCOUNTER — Encounter: Payer: Self-pay | Admitting: Internal Medicine

## 2021-05-04 VITALS — BP 120/78 | HR 90 | Temp 98.1°F | Ht 58.6 in | Wt 146.6 lb

## 2021-05-04 DIAGNOSIS — M79642 Pain in left hand: Secondary | ICD-10-CM | POA: Insufficient documentation

## 2021-05-04 DIAGNOSIS — E6609 Other obesity due to excess calories: Secondary | ICD-10-CM

## 2021-05-04 DIAGNOSIS — E1122 Type 2 diabetes mellitus with diabetic chronic kidney disease: Secondary | ICD-10-CM | POA: Diagnosis not present

## 2021-05-04 DIAGNOSIS — Z6830 Body mass index (BMI) 30.0-30.9, adult: Secondary | ICD-10-CM

## 2021-05-04 DIAGNOSIS — I129 Hypertensive chronic kidney disease with stage 1 through stage 4 chronic kidney disease, or unspecified chronic kidney disease: Secondary | ICD-10-CM | POA: Diagnosis not present

## 2021-05-04 DIAGNOSIS — N182 Chronic kidney disease, stage 2 (mild): Secondary | ICD-10-CM | POA: Diagnosis not present

## 2021-05-04 DIAGNOSIS — M79641 Pain in right hand: Secondary | ICD-10-CM

## 2021-05-04 MED ORDER — LOSARTAN POTASSIUM 50 MG PO TABS: ORAL_TABLET | ORAL | 1 refills | Status: DC

## 2021-05-04 MED ORDER — HYDROCHLOROTHIAZIDE 12.5 MG PO CAPS: ORAL_CAPSULE | ORAL | 1 refills | Status: DC

## 2021-05-04 NOTE — Patient Instructions (Signed)

## 2021-05-04 NOTE — Progress Notes (Signed)
?Rich Brave [place],acting as a Education administrator for Maximino Greenland, MD.,have documented all relevant documentation on the behalf of Maximino Greenland, MD,as directed by  Maximino Greenland, MD while in the presence of Maximino Greenland, MD.  ?This visit occurred during the SARS-CoV-2 public health emergency.  Safety protocols were in place, including screening questions prior to the visit, additional usage of staff PPE, and extensive cleaning of exam room while observing appropriate contact time as indicated for disinfecting solutions. ? ?Subjective:  ?  ? Patient ID: Vickie Brown , female    DOB: Mar 14, 1940 , 81 y.o.   MRN: [id number] ? ? ?Chief Complaint  ?Patient presents with  ? Diabetes  ? Hypertension  ? ? ?HPI ? ?She is here today for a diabetes and BP check.  She reports compliance with meds. She denies having any headaches, chest pain and shortness of breath.   ? ?Diabetes ?She presents for her follow-up diabetic visit. She has type 2 diabetes mellitus. Her disease course has been stable. There are no hypoglycemic associated symptoms. Pertinent negatives for hypoglycemia include no headaches. Pertinent negatives for diabetes include no blurred vision, no chest pain, no polydipsia, no polyphagia and no polyuria. There are no hypoglycemic complications. Risk factors for coronary artery disease include diabetes mellitus, dyslipidemia, hypertension and post-menopausal. She is following a diabetic diet. Her breakfast blood glucose is taken between 8-9 am. Her breakfast blood glucose range is generally 90-110 mg/dl.  ?Hypertension ?This is a chronic problem. The current episode started more than 1 year ago. The problem has been gradually improving since onset. Pertinent negatives include no blurred vision, chest pain, headaches, palpitations or shortness of breath.   ? ?Past Medical History:  ?Diagnosis Date  ? Chronic headaches   ? Diverticulitis   ? Hyperlipidemia   ? Hypertension   ? Knee pain   ? Type 2 diabetes  mellitus ([place])   ?  ? ?Family History  ?Problem Relation Age of Onset  ? Cervical cancer Mother   ? Cancer Mother   ? Diabetes Father   ? Diabetes Sister   ? Diabetes Brother   ? Other Brother   ?     killed  ? Breast cancer Neg Hx   ? ? ? ?Current Outpatient Medications:  ?  acetaminophen (TYLENOL) 325 MG tablet, Take 650 mg by mouth every 6 (six) hours as needed., Disp: , Rfl:  ?  alendronate (FOSAMAX) 70 MG tablet, TAKE 1 TABLET(70 MG) BY MOUTH 1 TIME A WEEK WITH A FULL GLASS OF WATER AND ON AN EMPTY STOMACH, Disp: 12 tablet, Rfl: 3 ?  ascorbic acid (VITAMIN C) 500 MG tablet, Take 500 mg by mouth daily., Disp: , Rfl:  ?  aspirin 81 MG tablet, Take 81 mg by mouth every evening., Disp: , Rfl:  ?  benzonatate (TESSALON PERLES) 100 MG capsule, Take 1 capsule (100 mg total) by mouth every 6 (six) hours as needed for cough., Disp: 30 capsule, Rfl: 0 ?  BIOTIN PO, Take by mouth daily., Disp: , Rfl:  ?  Calcium Carbonate (CALCIUM 500 PO), Take by mouth., Disp: , Rfl:  ?  cetirizine (ZYRTEC) 10 MG tablet, Take 10 mg by mouth at bedtime., Disp: , Rfl:  ?  Cholecalciferol (VITAMIN D3) 2000 UNITS TABS, Take 1 tablet by mouth daily., Disp: , Rfl:  ?  fluticasone (FLONASE) 50 MCG/ACT nasal spray, Place 1 spray into both nostrils at bedtime., Disp: 16 g, Rfl: 2 ?  ipratropium (ATROVENT)  0.03 % nasal spray, Place 2 sprays into the nose 3 (three) times daily. (Patient taking differently: Place 2 sprays into the nose as needed.), Disp: 30 mL, Rfl: 2 ?  JANUVIA 100 MG tablet, TAKE 1 TABLET(100 MG) BY MOUTH DAILY, Disp: 90 tablet, Rfl: 1 ?  Multiple Vitamins-Minerals (ALIVE ONCE DAILY WOMENS 50+ PO), Take 1 tablet by mouth daily., Disp: , Rfl:  ?  Omega-3 Fatty Acids (FISH OIL PO), Take by mouth., Disp: , Rfl:  ?  pravastatin (PRAVACHOL) 40 MG tablet, TAKE 1 TABLET BY MOUTH DAILY, Disp: 90 tablet, Rfl: 1 ?  Probiotic Product (ALIGN) 4 MG CAPS, Take 4 mg by mouth daily., Disp: , Rfl:  ?  TURMERIC PO, Take by mouth., Disp: , Rfl:  ?   UNABLE TO FIND, Med Name: Unkers therapeutic Rub, Disp: , Rfl:  ?  vitamin B-12 (CYANOCOBALAMIN) 500 MCG tablet, Take 500 mcg by mouth daily., Disp: , Rfl:  ?  Homeopathic Products (OSCILLOCOCCINUM PO), Take by mouth. (Patient not taking: Reported on 02/05/2021), Disp: , Rfl:  ?  hydrochlorothiazide (MICROZIDE) 12.5 MG capsule, TAKE 1 CAPSULE(12.5 MG) BY MOUTH EVERY MORNING, Disp: 90 capsule, Rfl: 1 ?  losartan (COZAAR) 50 MG tablet, TAKE 1 TABLET(50 MG) BY MOUTH DAILY, Disp: 90 tablet, Rfl: 1  ? ?No Known Allergies  ? ?Review of Systems  ?Constitutional: Negative.   ?Eyes:  Negative for blurred vision.  ?Respiratory: Negative.  Negative for shortness of breath.   ?Cardiovascular: Negative.  Negative for chest pain and palpitations.  ?Gastrointestinal: Negative.   ?Endocrine: Negative for polydipsia, polyphagia and polyuria.  ?Musculoskeletal:  Positive for arthralgias.  ?Neurological: Negative.  Negative for headaches.  ?Psychiatric/Behavioral: Negative.     ? ?Today's Vitals  ? 05/04/21 1412  ?BP: 120/78  ?Pulse: 90  ?Temp: 98.1 ?F (36.7 ?C)  ?Weight: 146 lb 9.6 oz (66.5 kg)  ?Height: 4' 10.6" (1.488 m)  ?PainSc: 0-No pain  ? ?Body mass index is 30.02 kg/m?.  ?Wt Readings from Last 3 Encounters:  ?05/04/21 146 lb 9.6 oz (66.5 kg)  ?02/05/21 148 lb 3.2 oz (67.2 kg)  ?10/14/20 149 lb (67.6 kg)  ?  ?Objective:  ?Physical Exam ?Vitals and nursing note reviewed.  ?Constitutional:   ?   Appearance: Normal appearance.  ?HENT:  ?   Head: Normocephalic and atraumatic.  ?Eyes:  ?   Extraocular Movements: Extraocular movements intact.  ?Cardiovascular:  ?   Rate and Rhythm: Normal rate and regular rhythm.  ?   Heart sounds: Normal heart sounds.  ?Pulmonary:  ?   Effort: Pulmonary effort is normal.  ?   Breath sounds: Normal breath sounds.  ?Musculoskeletal:  ?   Cervical back: Normal range of motion.  ?Skin: ?   General: Skin is warm.  ?Neurological:  ?   General: No focal deficit present.  ?   Mental Status: She is alert.   ?Psychiatric:     ?   Mood and Affect: Mood normal.     ?   Behavior: Behavior normal.  ?  ? ?   ?Assessment And Plan:  ?   ?1. Type 2 diabetes mellitus with stage 2 chronic kidney disease, without long-term current use of insulin ([place]) ?Comments: Chronic, I will check labs as below. She agrees to rto in 4 months for re-evaluation.  ?- Hemoglobin A1c ?- BMP8+EGFR ? ?2. Hypertensive nephropathy ?Comments: Chronic, well controlled. She is encouragedto follow low sodium diet.  ?- hydrochlorothiazide (MICROZIDE) 12.5 MG capsule; TAKE 1 CAPSULE(12.5  MG) BY MOUTH EVERY MORNING  Dispense: 90 capsule; Refill: 1 ?- losartan (COZAAR) 50 MG tablet; TAKE 1 TABLET(50 MG) BY MOUTH DAILY  Dispense: 90 tablet; Refill: 1 ? ?3. Pain in both hands ?Comments: Likely due to OA.  Advised to apply Voltaren gel to affected areas twice daily prn.  I will send rx Tylenol w/codeine to use prn. ?- UNABLE TO FIND; Med Name: Unkers therapeutic Rub ? ?4. Class 1 obesity due to excess calories with serious comorbidity and body mass index (BMI) of 30.0 to 30.9 in adult ?Comments: Her BMI is acceptable for her demographic. She is encouraged to aim for at least 150 minutes of exercise per week. ?  ?Patient was given opportunity to ask questions. Patient verbalized understanding of the plan and was able to repeat key elements of the plan. All questions were answered to their satisfaction.  ? ?I, Maximino Greenland, MD, have reviewed all documentation for this visit. The documentation on 05/04/21 for the exam, diagnosis, procedures, and orders are all accurate and complete.  ? ?IF YOU HAVE BEEN REFERRED TO A SPECIALIST, IT MAY TAKE 1-2 WEEKS TO SCHEDULE/PROCESS THE REFERRAL. IF YOU HAVE NOT HEARD FROM US/SPECIALIST IN TWO WEEKS, PLEASE GIVE [place] A CALL AT [phone].  ? ?THE PATIENT IS ENCOURAGED TO PRACTICE SOCIAL DISTANCING DUE TO THE COVID-19 PANDEMIC.   ?

## 2021-05-05 LAB — BMP8+EGFR
BUN/Creatinine Ratio: 8 — ABNORMAL LOW (ref 12–28)
BUN: 8 mg/dL (ref 8–27)
CO2: 24 mmol/L (ref 20–29)
Calcium: 10.1 mg/dL (ref 8.7–10.3)
Chloride: 91 mmol/L — ABNORMAL LOW (ref 96–106)
Creatinine, Ser: 0.99 mg/dL (ref 0.57–1.00)
Glucose: 89 mg/dL (ref 70–99)
Potassium: 4.3 mmol/L (ref 3.5–5.2)
Sodium: 136 mmol/L (ref 134–144)
eGFR: 57 mL/min/{1.73_m2} — ABNORMAL LOW (ref >59)

## 2021-05-05 LAB — HEMOGLOBIN A1C
Est. average glucose Bld gHb Est-mCnc: 137 mg/dL
Hgb A1c MFr Bld: 6.4 % — ABNORMAL HIGH (ref 4.8–5.6)

## 2021-05-18 ENCOUNTER — Telehealth: Payer: Self-pay

## 2021-05-18 NOTE — Chronic Care Management (AMB) (Signed)
    Chronic Care Management Pharmacy Assistant   Name: Vickie Brown  MRN: [id number] DOB: 1940/06/27  Reason for Encounter: Disease State/ Diabetes  Recent office visits:  05-04-2021 Glendale Chard, MD. eGFR= 57, BUN/Creatinine= 8, Chloride= 91. A1C= 6.4. STOP baclofen.  Recent consult visits:  None  Hospital visits:  None in previous 6 months  Medications: Outpatient Encounter Medications as of 05/18/2021  Medication Sig   acetaminophen (TYLENOL) 325 MG tablet Take 650 mg by mouth every 6 (six) hours as needed.   alendronate (FOSAMAX) 70 MG tablet TAKE 1 TABLET(70 MG) BY MOUTH 1 TIME A WEEK WITH A FULL GLASS OF WATER AND ON AN EMPTY STOMACH   ascorbic acid (VITAMIN C) 500 MG tablet Take 500 mg by mouth daily.   aspirin 81 MG tablet Take 81 mg by mouth every evening.   benzonatate (TESSALON PERLES) 100 MG capsule Take 1 capsule (100 mg total) by mouth every 6 (six) hours as needed for cough.   BIOTIN PO Take by mouth daily.   Calcium Carbonate (CALCIUM 500 PO) Take by mouth.   cetirizine (ZYRTEC) 10 MG tablet Take 10 mg by mouth at bedtime.   Cholecalciferol (VITAMIN D3) 2000 UNITS TABS Take 1 tablet by mouth daily.   fluticasone (FLONASE) 50 MCG/ACT nasal spray Place 1 spray into both nostrils at bedtime.   Homeopathic Products (OSCILLOCOCCINUM PO) Take by mouth. (Patient not taking: Reported on 02/05/2021)   hydrochlorothiazide (MICROZIDE) 12.5 MG capsule TAKE 1 CAPSULE(12.5 MG) BY MOUTH EVERY MORNING   ipratropium (ATROVENT) 0.03 % nasal spray Place 2 sprays into the nose 3 (three) times daily. (Patient taking differently: Place 2 sprays into the nose as needed.)   JANUVIA 100 MG tablet TAKE 1 TABLET(100 MG) BY MOUTH DAILY   losartan (COZAAR) 50 MG tablet TAKE 1 TABLET(50 MG) BY MOUTH DAILY   Multiple Vitamins-Minerals (ALIVE ONCE DAILY WOMENS 50+ PO) Take 1 tablet by mouth daily.   Omega-3 Fatty Acids (FISH OIL PO) Take by mouth.   pravastatin (PRAVACHOL) 40 MG tablet TAKE 1  TABLET BY MOUTH DAILY   Probiotic Product (ALIGN) 4 MG CAPS Take 4 mg by mouth daily.   TURMERIC PO Take by mouth.   UNABLE TO FIND Med Name: Unkers therapeutic Rub   vitamin B-12 (CYANOCOBALAMIN) 500 MCG tablet Take 500 mcg by mouth daily.   No facility-administered encounter medications on file as of 05/18/2021.   Recent Relevant Labs: Lab Results  Component Value Date/Time   HGBA1C 6.4 (H) 05/04/2021 02:51 PM   HGBA1C 6.4 (H) 02/05/2021 02:39 PM   MICROALBUR 10 06/25/2020 12:47 PM   MICROALBUR 10 06/21/2019 12:56 PM    Kidney Function Lab Results  Component Value Date/Time   CREATININE 0.99 05/04/2021 02:51 PM   CREATININE 1.05 (H) 02/05/2021 02:39 PM   GFRNONAA 55 (L) 12/24/2019 04:22 PM   GFRAA 64 12/24/2019 04:22 PM   05-18-2021: 1st attempt left VM 05-22-2021: 2nd attempt left VM 05-25-2021: 3rd attempt left VM  Care Gaps: Yearly ophthalmology exam Covid booster overdue Yearly foot exam overdue AWV 07-30-2021  Star Rating Drugs: Pravastatin 40 mg- Last filled 03-30-2021 90 DS [facility] Losartan 25 mg- Last filled 05-04-2021 90 [facility] Januvia 100 mg- Last filled 04-16-2021 90 DS [facility] Clinical Pharmacist Assistant [phone]

## 2021-05-26 NOTE — Progress Notes (Deleted)
Office Visit Note  Patient: Vickie Brown             Date of Birth: 07-Aug-1940           MRN: [id number]             PCP: Glendale Chard, MD Referring: Glendale Chard, MD Visit Date: 06/09/2021   Subjective:  No chief complaint on file.   History of Present Illness: Vickie Brown is a 81 y.o. female here for follow up osteoporosis, taking fosamax 70 mg by mouth once weekly.    Previous HPI 10/14/2020  Vickie Brown is a 81 y.o. female with a history of type 2 diabetes, hypertensive nephropathy, diverticulitis, and generalized osteoarthritis here for management of osteoporosis.  She had bone density test in May consistent with osteoporosis of the lumbar spine and in the left femoral neck.  No comparison bone density available to review.  She has no history of fragility fractures.  She has not fallen during the past year.  She does not have particular problems of dizziness or vertigo, no significant visual impairment, does not have severe peripheral neuropathy.  She already takes daily vitamin D supplementation.  No history of cancers and no history of long-term steroid treatments. She also complains of joint pain in multiple sites today with generalized osteoarthritis.  There is sometimes swelling in the right ankle sometimes extending part way to the right knee otherwise joint pain not associated with significant swelling elsewhere.     06/05/20 DEXA [facility] AP LUMBAR SPINE L3 and L4 Bone Mineral Density (BMD):  0.931 g/cm2 Young Adult T-Score:  -2.5 LEFT FEMUR NECK Bone Mineral Density (BMD):  0.577 g/cm2 Young Adult T-Score: -2.6   No Rheumatology ROS completed.   PMFS History:  Patient Active Problem List   Diagnosis Date Noted   Pain in both hands 05/04/2021   Atherosclerosis of aorta ([place]) 02/05/2021   Fasciculations 10/19/2020   Class 1 obesity due to excess calories with serious comorbidity and body mass index (BMI) of 30.0 to 30.9 in adult 10/19/2020    Age-related osteoporosis without current pathological fracture 10/14/2020   Vitamin D deficiency 10/14/2020   Generalized osteoarthritis 10/14/2020   Type 2 diabetes mellitus with stage 2 chronic kidney disease, without long-term current use of insulin ([place]) 12/22/2017   Chronic renal disease, stage II 12/22/2017   Hypertensive nephropathy 12/22/2017   Dermatitis 12/22/2017   Dyspnea on exertion 06/22/2010   Myalgia 06/22/2010    Past Medical History:  Diagnosis Date   Chronic headaches    Diverticulitis    Hyperlipidemia    Hypertension    Knee pain    Type 2 diabetes mellitus ([place])     Family History  Problem Relation Age of Onset   Cervical cancer Mother    Cancer Mother    Diabetes Father    Diabetes Sister    Diabetes Brother    Other Brother        killed   Breast cancer Neg Hx    Past Surgical History:  Procedure Laterality Date   Bilateral tubal ligation  1971   BTL     PARTIAL HYSTERECTOMY  1973   TONSILECTOMY, ADENOIDECTOMY, BILATERAL MYRINGOTOMY AND TUBES  1950   VESICOVAGINAL FISTULA CLOSURE W/ TAH     Social History   Social History Narrative   Lives alone.   Immunization History  Administered Date(s) Administered   DTaP 10/28/2015   Fluad Quad(high Dose 65+) 10/19/2018, 10/19/2019, 10/09/2020  Influenza Whole 10/11/2009   Influenza, High Dose Seasonal PF 10/21/2017   Influenza-Unspecified 10/19/2018   PFIZER(Purple Top)SARS-COV-2 Vaccination 02/19/2019, 03/16/2019, 12/11/2019, 06/04/2020   PNEUMOCOCCAL CONJUGATE-20 02/05/2021   Pneumococcal-Unspecified 03/28/2013, 10/28/2015   Zoster Recombinat (Shingrix) 05/13/2016, 08/18/2016     Objective: Vital Signs: There were no vitals taken for this visit.   Physical Exam   Musculoskeletal Exam: ***  CDAI Exam: CDAI Score: -- Patient Global: --; Provider Global: -- Swollen: --; Tender: -- Joint Exam 06/09/2021   No joint exam has been documented for this visit   There is currently no  information documented on the homunculus. Go to the Rheumatology activity and complete the homunculus joint exam.  Investigation: No additional findings.  Imaging: No results found.  Recent Labs: Lab Results  Component Value Date   WBC 4.0 10/09/2020   HGB 12.1 10/09/2020   PLT 301 10/09/2020   NA 136 05/04/2021   K 4.3 05/04/2021   CL 91 (L) 05/04/2021   CO2 24 05/04/2021   GLUCOSE 89 05/04/2021   BUN 8 05/04/2021   CREATININE 0.99 05/04/2021   BILITOT 0.2 02/05/2021   ALKPHOS 58 02/05/2021   AST 26 02/05/2021   ALT 18 02/05/2021   PROT 6.9 02/05/2021   ALBUMIN 4.6 02/05/2021   CALCIUM 10.1 05/04/2021   GFRAA 64 12/24/2019    Speciality Comments: No specialty comments available.  Procedures:  No procedures performed Allergies: Patient has no known allergies.   Assessment / Plan:     Visit Diagnoses: No diagnosis found.  ***  Orders: No orders of the defined types were placed in this encounter.  No orders of the defined types were placed in this encounter.    Follow-Up Instructions: No follow-ups on file.   Earnestine Mealing, CMA  Note - This record has been created using Editor, commissioning.  Chart creation errors have been sought, but may not always  have been located. Such creation errors do not reflect on  the standard of medical care.

## 2021-06-09 ENCOUNTER — Ambulatory Visit: Payer: Medicare Other | Admitting: Internal Medicine

## 2021-06-09 DIAGNOSIS — M810 Age-related osteoporosis without current pathological fracture: Secondary | ICD-10-CM

## 2021-06-09 DIAGNOSIS — M159 Polyosteoarthritis, unspecified: Secondary | ICD-10-CM

## 2021-06-09 DIAGNOSIS — E559 Vitamin D deficiency, unspecified: Secondary | ICD-10-CM

## 2021-06-09 DIAGNOSIS — N182 Chronic kidney disease, stage 2 (mild): Secondary | ICD-10-CM

## 2021-06-27 ENCOUNTER — Other Ambulatory Visit: Payer: Self-pay | Admitting: Internal Medicine

## 2021-07-27 ENCOUNTER — Ambulatory Visit
Admission: EM | Admit: 2021-07-27 | Discharge: 2021-07-27 | Disposition: A | Discharge status: Home / Self Care | Payer: Medicare Other

## 2021-07-27 DIAGNOSIS — M79661 Pain in right lower leg: Secondary | ICD-10-CM | POA: Diagnosis not present

## 2021-07-27 NOTE — Discharge Instructions (Signed)
Please go to emergency department for ultrasound to rule out blood clot.

## 2021-07-27 NOTE — ED Triage Notes (Signed)
Patient presents to Urgent Care with complaints of right calf pain since 4 days ago. Patient reports she has attempted tylenol extra strength .

## 2021-07-27 NOTE — ED Provider Notes (Signed)
   [facility] Provider Note  Patient Contact: 4:12 PM (approximate)   History   calf pain   HPI  Vickie Brown is a 81 y.o. female presents to the urgent care with right calf pain for the past 4 days.  Patient states that pain was so severe it kept her up last night.  She is also noticed some right leg swelling.  No shortness of breath or chest tightness.      Physical Exam   Triage Vital Signs: ED Triage Vitals  Enc Vitals Group     BP 07/27/21 1534 124/66     Pulse Rate 07/27/21 1534 84     Resp 07/27/21 1534 18     Temp 07/27/21 1534 98 F (36.7 C)     Temp Source 07/27/21 1534 Oral     SpO2 07/27/21 1534 95 %     Weight --      Height --      Head Circumference --      Peak Flow --      Pain Score 07/27/21 1552 5     Pain Loc --      Pain Edu? --      Excl. in GC? --     Most recent vital signs: Vitals:   07/27/21 1534  BP: 124/66  Pulse: 84  Resp: 18  Temp: 98 F (36.7 C)  SpO2: 95%     General: Alert and in no acute distress. Eyes:  PERRL. EOMI. Head: No acute traumatic findings ENT:      Nose: No congestion/rhinnorhea.      Mouth/Throat: Mucous membranes are moist. Neck: No stridor. No cervical spine tenderness to palpation. Cardiovascular:  Good peripheral perfusion Respiratory: Normal respiratory effort without tachypnea or retractions. Lungs CTAB. Good air entry to the bases with no decreased or absent breath sounds. Gastrointestinal: Bowel sounds 4 quadrants. Soft and nontender to palpation. No guarding or rigidity. No palpable masses. No distention. No CVA tenderness. Musculoskeletal: Full range of motion to all extremities.  Patient has 2+ pitting edema of the right calf and tenderness with palpation. Neurologic:  No gross focal neurologic deficits are appreciated.  Skin:   No rash noted Other:   ED Results / Procedures / Treatments   Labs (all labs ordered are listed, but only abnormal results are  displayed) Labs Reviewed - No data to display     PROCEDURES:  Critical Care performed: No  Procedures   MEDICATIONS ORDERED IN ED: Medications - No data to display   IMPRESSION / MDM / ASSESSMENT AND PLAN / ED COURSE  I reviewed the triage vital signs and the nursing notes.                              Assessment and plan Right calf pain 81 year old female presents to the urgent care with right calf pain for the past 4 days that has persisted despite Tylenol use.  Patient was referred to the emergency department for DVT rule out.     FINAL CLINICAL IMPRESSION(S) / ED DIAGNOSES   Final diagnoses:  Right calf pain     Rx / DC Orders   ED Discharge Orders     None        Note:  This document was prepared using Dragon voice recognition software and may include unintentional dictation errors.   Pia Mau [place], [place] 07/27/21 1614

## 2021-07-28 ENCOUNTER — Emergency Department (HOSPITAL_BASED_OUTPATIENT_CLINIC_OR_DEPARTMENT_OTHER): Payer: Medicare Other

## 2021-07-28 ENCOUNTER — Emergency Department (HOSPITAL_COMMUNITY): Payer: Medicare Other

## 2021-07-28 ENCOUNTER — Encounter (HOSPITAL_COMMUNITY): Payer: Self-pay | Admitting: Emergency Medicine

## 2021-07-28 ENCOUNTER — Telehealth: Payer: Self-pay

## 2021-07-28 ENCOUNTER — Emergency Department (HOSPITAL_COMMUNITY)
Admission: EM | Admit: 2021-07-28 | Discharge: 2021-07-28 | Disposition: A | Discharge status: Home / Self Care | Payer: Medicare Other | Attending: Emergency Medicine | Admitting: Emergency Medicine

## 2021-07-28 ENCOUNTER — Other Ambulatory Visit: Payer: Self-pay

## 2021-07-28 DIAGNOSIS — M79609 Pain in unspecified limb: Secondary | ICD-10-CM | POA: Diagnosis not present

## 2021-07-28 DIAGNOSIS — E119 Type 2 diabetes mellitus without complications: Secondary | ICD-10-CM | POA: Diagnosis not present

## 2021-07-28 DIAGNOSIS — M79604 Pain in right leg: Secondary | ICD-10-CM | POA: Diagnosis not present

## 2021-07-28 DIAGNOSIS — R600 Localized edema: Secondary | ICD-10-CM | POA: Diagnosis not present

## 2021-07-28 DIAGNOSIS — M79661 Pain in right lower leg: Secondary | ICD-10-CM | POA: Diagnosis not present

## 2021-07-28 DIAGNOSIS — Z7982 Long term (current) use of aspirin: Secondary | ICD-10-CM | POA: Diagnosis not present

## 2021-07-28 DIAGNOSIS — Z7984 Long term (current) use of oral hypoglycemic drugs: Secondary | ICD-10-CM | POA: Insufficient documentation

## 2021-07-28 NOTE — ED Triage Notes (Signed)
Pt report pain in her right leg for about 1 week now. Pt reports being sent to ED from [facility] for an ultra sound.

## 2021-07-28 NOTE — Discharge Instructions (Signed)
Your ultrasound did not show any deep venous thrombosis.  X-ray did not show any occult fracture.  We provided you an Ace wrap for your right ankle, please keep this elevated, apply ice to help with pain.  Phone number to orthopedic specialist attached to your chart if you wish to see them for further management of your right ankle pain.

## 2021-07-28 NOTE — ED Provider Notes (Signed)
[facility]-EMERGENCY DEPT Provider Note   CSN: [id number] Arrival date & time: 07/28/21  8242     History  Chief Complaint  Patient presents with   Leg Pain    Vickie Brown is a 81 y.o. female.   81 year old female with a past medical history of diabetes presents to the ED with a chief complaint of right leg pain which has been ongoing for a couple of days.  Patient reports pain along the right calf, right ankle exacerbated with any ambulation along with weightbearing.  Did see urgent care yesterday and was sent to the ED to rule out a DVT.  She has been applying diclofenac cream to the right ankle, along with taking Tylenol or extra strength, however noted the pain to be so severe Sunday night that did not let her sleep.  No alleviating factors.  No prior history of blood clots, non-smoker, no fever, no trauma.   The history is provided by the patient and medical records.  Leg Pain Location:  Ankle Time since incident:  3 days Injury: no   Ankle location:  R ankle Pain details:    Quality:  Aching and cramping Associated symptoms: no back pain and no fever        Home Medications Prior to Admission medications   Medication Sig Start Date End Date Taking? Authorizing Provider  acetaminophen (TYLENOL) 325 MG tablet Take 650 mg by mouth every 6 (six) hours as needed.    [provider]  alendronate (FOSAMAX) 70 MG tablet TAKE 1 TABLET(70 MG) BY MOUTH 1 TIME A WEEK WITH A FULL GLASS OF WATER AND ON AN EMPTY STOMACH 04/27/21   Rice, Jamesetta Orleans, MD  ascorbic acid (VITAMIN C) 500 MG tablet Take 500 mg by mouth daily.    [provider]  aspirin 81 MG tablet Take 81 mg by mouth every evening.    [provider]  benzonatate (TESSALON PERLES) 100 MG capsule Take 1 capsule (100 mg total) by mouth every 6 (six) hours as needed for cough. 08/20/20 08/20/21  Arnette Felts, FNP  BIOTIN PO Take by mouth daily.    [provider]   Calcium Carbonate (CALCIUM 500 PO) Take by mouth.    [provider]  cetirizine (ZYRTEC) 10 MG tablet Take 10 mg by mouth at bedtime.    [provider]  Cholecalciferol (VITAMIN D3) 2000 UNITS TABS Take 1 tablet by mouth daily.    [provider]  fluticasone (FLONASE) 50 MCG/ACT nasal spray Place 1 spray into both nostrils at bedtime. 07/08/13   Garnetta Buddy, MD  Homeopathic Products (OSCILLOCOCCINUM PO) Take by mouth. Patient not taking: Reported on 02/05/2021    [provider]  hydrochlorothiazide (MICROZIDE) 12.5 MG capsule TAKE 1 CAPSULE(12.5 MG) BY MOUTH EVERY MORNING 05/04/21   Dorothyann Peng, MD  ipratropium (ATROVENT) 0.03 % nasal spray Place 2 sprays into the nose 3 (three) times daily. Patient taking differently: Place 2 sprays into the nose as needed. 03/10/20 05/04/21  Arnette Felts, FNP  JANUVIA 100 MG tablet TAKE 1 TABLET(100 MG) BY MOUTH DAILY 06/29/21   Dorothyann Peng, MD  losartan (COZAAR) 50 MG tablet TAKE 1 TABLET(50 MG) BY MOUTH DAILY 05/04/21   Dorothyann Peng, MD  Multiple Vitamins-Minerals (ALIVE ONCE DAILY WOMENS 50+ PO) Take 1 tablet by mouth daily.    [provider]  Omega-3 Fatty Acids (FISH OIL PO) Take by mouth.    [provider]  pravastatin (PRAVACHOL)  40 MG tablet TAKE 1 TABLET BY MOUTH DAILY 03/30/21   Dorothyann Peng, MD  Probiotic Product (ALIGN) 4 MG CAPS Take 4 mg by mouth daily.    [provider]  TURMERIC PO Take by mouth.    [provider]  UNABLE TO FIND Med Name: Unkers therapeutic Rub    [provider]  vitamin B-12 (CYANOCOBALAMIN) 500 MCG tablet Take 500 mcg by mouth daily.    [provider]      Allergies    Patient has no known allergies.    Review of Systems   Review of Systems  Constitutional:  Negative for chills and fever.  Respiratory:  Negative for shortness of breath.   Cardiovascular:  Negative for chest pain, palpitations and leg  swelling.  Gastrointestinal:  Negative for abdominal pain, nausea and vomiting.  Genitourinary:  Negative for flank pain.  Musculoskeletal:  Negative for back pain.  Neurological:  Negative for light-headedness and headaches.  All other systems reviewed and are negative.   Physical Exam Updated Vital Signs BP (!) 97/53   Pulse 66   Temp 98 F (36.7 C) (Oral)   Resp 16   SpO2 98%  Physical Exam Nursing note reviewed.  Constitutional:      Appearance: Normal appearance.  HENT:     Head: Normocephalic and atraumatic.     Mouth/Throat:     Mouth: Mucous membranes are dry.  Eyes:     Pupils: Pupils are equal, round, and reactive to light.  Cardiovascular:     Rate and Rhythm: Normal rate.     Pulses: Normal pulses.          Dorsalis pedis pulses are 2+ on the right side and 2+ on the left side.       Posterior tibial pulses are 2+ on the right side and 2+ on the left side.  Pulmonary:     Effort: Pulmonary effort is normal.     Breath sounds: No wheezing.  Abdominal:     General: Abdomen is flat.  Musculoskeletal:     Cervical back: Normal range of motion and neck supple.     Right ankle: No swelling, deformity or ecchymosis. Tenderness present over the lateral malleolus. Normal range of motion.     Left ankle: No swelling, deformity or ecchymosis. Normal range of motion.  Feet:     Right foot:     Skin integrity: Skin integrity normal.     Toenail Condition: Right toenails are normal.     Left foot:     Skin integrity: Skin integrity normal.     Toenail Condition: Left toenails are normal.  Skin:    General: Skin is warm and dry.  Neurological:     Mental Status: She is alert and oriented to person, place, and time.     ED Results / Procedures / Treatments   Labs (all labs ordered are listed, but only abnormal results are displayed) Labs Reviewed - No data to display  EKG None  Radiology VAS [place] LOWER EXTREMITY VENOUS (DVT)  Result Date: 07/28/2021  Lower  Venous DVT Study Patient Name:  Vickie Brown  Date of Exam:   07/28/2021 Medical Rec #: [id number]        Accession #:    [id number] Date of Birth: Mar 29, 1940        Patient Gender: F Patient Age:   29 years Exam Location:  [facility] Procedure:      VAS [place] LOWER  EXTREMITY VENOUS (DVT) Referring Phys: Leonie Douglas Shastina Rua --------------------------------------------------------------------------------  Indications: Pain.  Comparison Study: No previous exams Performing Technologist: Jody Hill RVT, RDMS  Examination Guidelines: A complete evaluation includes B-mode imaging, spectral Doppler, color Doppler, and power Doppler as needed of all accessible portions of each vessel. Bilateral testing is considered an integral part of a complete examination. Limited examinations for reoccurring indications may be performed as noted. The reflux portion of the exam is performed with the patient in reverse Trendelenburg.  +---------+---------------+---------+-----------+----------+--------------+ RIGHT    CompressibilityPhasicitySpontaneityPropertiesThrombus Aging +---------+---------------+---------+-----------+----------+--------------+ CFV      Full           Yes      Yes                                 +---------+---------------+---------+-----------+----------+--------------+ SFJ      Full                                                        +---------+---------------+---------+-----------+----------+--------------+ FV Prox  Full           Yes      Yes                                 +---------+---------------+---------+-----------+----------+--------------+ FV Mid   Full           Yes      Yes                                 +---------+---------------+---------+-----------+----------+--------------+ FV DistalFull           Yes      Yes                                 +---------+---------------+---------+-----------+----------+--------------+ PFV      Full                                                         +---------+---------------+---------+-----------+----------+--------------+ POP      Full           Yes      Yes                                 +---------+---------------+---------+-----------+----------+--------------+ PTV      Full                                                        +---------+---------------+---------+-----------+----------+--------------+ PERO     Full                                                        +---------+---------------+---------+-----------+----------+--------------+   +----+---------------+---------+-----------+----------+--------------+  LEFTCompressibilityPhasicitySpontaneityPropertiesThrombus Aging +----+---------------+---------+-----------+----------+--------------+ CFV Full           Yes      Yes                                 +----+---------------+---------+-----------+----------+--------------+     Summary: RIGHT: - There is no evidence of deep vein thrombosis in the lower extremity.  - No cystic structure found in the popliteal fossa.  LEFT: - No evidence of common femoral vein obstruction.  *See table(s) above for measurements and observations.    Preliminary    DG Ankle Complete Right  Result Date: 07/28/2021 CLINICAL DATA:  Diffuse right ankle pain, no reported injury EXAM: RIGHT ANKLE - COMPLETE 3+ VIEW COMPARISON:  None Available. FINDINGS: There is no evidence of fracture, dislocation, or joint effusion. There is no evidence of arthropathy or other focal bone abnormality. Soft tissue edema about the lateral ankle. IMPRESSION: 1. No fracture or dislocation of the right ankle. Joint spaces are preserved. 2. Soft tissue edema about the lateral ankle. Electronically Signed   By: Jearld LeschAlex D Bibbey M.D.   On: 07/28/2021 11:30    Procedures Procedures    Medications Ordered in ED Medications - No data to display  ED Course/ Medical Decision Making/ A&P                           Medical  Decision Making Amount and/or Complexity of Data Reviewed Radiology: ordered.   This patient presents to the ED for concern of right leg pain, this involves a number of treatment options, and is a complaint that carries with it a high risk of complications and morbidity.  The differential diagnosis includes DVT, cellulitis, occult fracture, tendonitis.    Co morbidities: Discussed in HPI   Brief History:  Patient here with right leg pain that is been ongoing for the past 3 days.  Evaluated by urgent care, told that there was likely a suspicion for DVT.  Sent to the ED for ultrasound to rule out.  Has been taking Tylenol Extra Strength, also topical cream without improvement in symptoms.  EMR reviewed including pt PMHx, past surgical history and past visits to ER.   See HPI for more details   Lab Tests:  I ordered and independently interpreted labs.  The pertinent results include:    N/A   Imaging Studies:  Xray of the right ankle: IMPRESSION:  1. No fracture or dislocation of the right ankle. Joint spaces are  preserved.  2. Soft tissue edema about the lateral ankle.   Ultrasound negative for DVT. Cardiac Monitoring:  N/A  Medicines ordered:  N/A   Reevaluation:  After the interventions noted above I re-evaluated patient and found that they have :stayed the same   Social Determinants of Health:  The patient's social determinants of health were a factor in the care of this patient    Problem List / ED Course:  Patient here with right leg swelling along with right leg pain for the past 3 days.  No improvement despite cream, along with Tylenol Extra Strength.  During evaluation there is no pitting edema, no calf tenderness but does report tenderness along the lateral malleolus, some suspicion for occult fracture.  Ultrasound DVT study was negative.  X-ray of the right ankle did not show any occult fracture.  Some suspicion for tendinitis, I did discuss RICE  therapy along  with brace for patient.  She will be given a outpatient orthopedic referral.  She is agreeable to plan and treatment.  Also discussed plan and care with granddaughter Joslyn Devon on the phone.   Dispostion:  After consideration of the diagnostic results and the patients response to treatment, I feel that the patent would benefit from RICE therapy, outpatient follow up with orthopedics.   Portions of this note were generated with Scientist, clinical (histocompatibility and immunogenetics). Dictation errors may occur despite best attempts at proofreading.   Final Clinical Impression(s) / ED Diagnoses Final diagnoses:  Right leg pain    Rx / DC Orders ED Discharge Orders     None         Freddy Jaksch 07/28/21 1228    Linwood Dibbles, MD 07/28/21 1528

## 2021-07-28 NOTE — Progress Notes (Signed)
RLE venous duplex has been completed.  Preliminary results given to [facility], PA-C.   Results can be found under chart review under CV PROC. 07/28/2021 12:16 PM Javyn Havlin RVT, RDMS

## 2021-07-28 NOTE — Telephone Encounter (Signed)
Transition Care Management Unsuccessful Follow-up Telephone Call  Date of discharge and from where:  07/28/2021 [place]   Attempts:  1st Attempt  Reason for unsuccessful TCM follow-up call:  Left voice message

## 2021-07-28 NOTE — ED Provider Triage Note (Signed)
Emergency Medicine Provider Triage Evaluation Note  Vickie Brown , a 81 y.o. female  was evaluated in triage.  Pt complains of right leg swelling for the right leg swelling x a couple of days. Evaluated at [facility] yesterday sent for US DVT study.  Review of Systems  Positive: Leg swelling right Negative: Sob, chest pain  Physical Exam  BP 115/73   Pulse 78   Temp 98 F (36.7 C) (Oral)   Resp 16   SpO2 98%  Gen:   Awake, no distress   Resp:  Normal effort  MSK:   Moves extremities without difficulty  Other:  Ttp along the right calf, no pitting edema  Medical Decision Making  Medically screening exam initiated at 10:00 AM.  Appropriate orders placed.  Vickie Brown was informed that the remainder of the evaluation will be completed by another provider, this initial triage assessment does not replace that evaluation, and the importance of remaining in the ED until their evaluation is complete.     Vickie Manges, PA-C 07/28/21 1002

## 2021-07-30 ENCOUNTER — Ambulatory Visit (INDEPENDENT_AMBULATORY_CARE_PROVIDER_SITE_OTHER): Payer: Medicare Other

## 2021-07-30 ENCOUNTER — Telehealth: Payer: Self-pay

## 2021-07-30 ENCOUNTER — Encounter: Payer: Self-pay | Admitting: Internal Medicine

## 2021-07-30 ENCOUNTER — Ambulatory Visit (INDEPENDENT_AMBULATORY_CARE_PROVIDER_SITE_OTHER): Payer: Medicare Other | Admitting: Internal Medicine

## 2021-07-30 VITALS — BP 108/56 | HR 90 | Temp 98.1°F | Ht 58.6 in | Wt 144.2 lb

## 2021-07-30 VITALS — BP 110/64 | HR 90 | Temp 98.1°F | Ht <= 58 in | Wt 144.0 lb

## 2021-07-30 DIAGNOSIS — Z0000 Encounter for general adult medical examination without abnormal findings: Secondary | ICD-10-CM

## 2021-07-30 DIAGNOSIS — E1122 Type 2 diabetes mellitus with diabetic chronic kidney disease: Secondary | ICD-10-CM

## 2021-07-30 DIAGNOSIS — N182 Chronic kidney disease, stage 2 (mild): Secondary | ICD-10-CM

## 2021-07-30 DIAGNOSIS — I129 Hypertensive chronic kidney disease with stage 1 through stage 4 chronic kidney disease, or unspecified chronic kidney disease: Secondary | ICD-10-CM

## 2021-07-30 DIAGNOSIS — M62831 Muscle spasm of calf: Secondary | ICD-10-CM

## 2021-07-30 DIAGNOSIS — Z6829 Body mass index (BMI) 29.0-29.9, adult: Secondary | ICD-10-CM

## 2021-07-30 NOTE — Addendum Note (Signed)
Addended by: Barb Merino on: 07/30/2021 09:55 AM   Modules accepted: Orders

## 2021-07-30 NOTE — Patient Instructions (Signed)

## 2021-07-30 NOTE — Progress Notes (Signed)
Subjective:   Vickie Brown is a 81 y.o. female who presents for Medicare Annual (Subsequent) preventive examination.  Review of Systems     Cardiac Risk Factors include: advanced age (>44men, >62 women);diabetes mellitus;hypertension     Objective:    Today's Vitals   07/30/21 0913 07/30/21 0919  BP: (!) 108/56   Pulse: 90   Temp: 98.1 F (36.7 C)   TempSrc: Oral   SpO2: 97%   Weight: 144 lb 3.2 oz (65.4 kg)   Height: 4' 10.6" (1.488 m)   PainSc:  6    Body mass index is 29.52 kg/m.     07/30/2021    9:25 AM 07/28/2021    9:50 AM 06/25/2020   10:25 AM 06/21/2019   11:27 AM 09/28/2018    9:35 AM 07/25/2016    3:32 PM  Advanced Directives  Does Patient Have a Medical Advance Directive? No No No No No No  Would patient like information on creating a medical advance directive? No - Patient declined No - Patient declined No - Patient declined No - Patient declined  No - Patient declined    Current Medications (verified) Outpatient Encounter Medications as of 07/30/2021  Medication Sig   acetaminophen (TYLENOL) 325 MG tablet Take 650 mg by mouth every 6 (six) hours as needed.   ascorbic acid (VITAMIN C) 500 MG tablet Take 500 mg by mouth daily.   aspirin 81 MG tablet Take 81 mg by mouth every evening.   benzonatate (TESSALON PERLES) 100 MG capsule Take 1 capsule (100 mg total) by mouth every 6 (six) hours as needed for cough.   BIOTIN PO Take by mouth daily.   Calcium Carbonate (CALCIUM 500 PO) Take by mouth.   cetirizine (ZYRTEC) 10 MG tablet Take 10 mg by mouth at bedtime.   Cholecalciferol (VITAMIN D3) 2000 UNITS TABS Take 1 tablet by mouth daily.   fluticasone (FLONASE) 50 MCG/ACT nasal spray Place 1 spray into both nostrils at bedtime.   Homeopathic Products (OSCILLOCOCCINUM PO) Take by mouth.   hydrochlorothiazide (MICROZIDE) 12.5 MG capsule TAKE 1 CAPSULE(12.5 MG) BY MOUTH EVERY MORNING   JANUVIA 100 MG tablet TAKE 1 TABLET(100 MG) BY MOUTH DAILY   losartan  (COZAAR) 50 MG tablet TAKE 1 TABLET(50 MG) BY MOUTH DAILY   Multiple Vitamins-Minerals (ALIVE ONCE DAILY WOMENS 50+ PO) Take 1 tablet by mouth daily.   Omega-3 Fatty Acids (FISH OIL PO) Take by mouth.   pravastatin (PRAVACHOL) 40 MG tablet TAKE 1 TABLET BY MOUTH DAILY   Probiotic Product (ALIGN) 4 MG CAPS Take 4 mg by mouth daily.   TURMERIC PO Take by mouth.   UNABLE TO FIND Med Name: Unkers therapeutic Rub   vitamin B-12 (CYANOCOBALAMIN) 500 MCG tablet Take 500 mcg by mouth daily.   alendronate (FOSAMAX) 70 MG tablet TAKE 1 TABLET(70 MG) BY MOUTH 1 TIME A WEEK WITH A FULL GLASS OF WATER AND ON AN EMPTY STOMACH (Patient not taking: Reported on 07/30/2021)   ipratropium (ATROVENT) 0.03 % nasal spray Place 2 sprays into the nose 3 (three) times daily. (Patient taking differently: Place 2 sprays into the nose as needed.)   No facility-administered encounter medications on file as of 07/30/2021.    Allergies (verified) Patient has no known allergies.   History: Past Medical History:  Diagnosis Date   Chronic headaches    Diverticulitis    Hyperlipidemia    Hypertension    Knee pain    Type 2 diabetes mellitus (HCC)  Past Surgical History:  Procedure Laterality Date   Bilateral tubal ligation  1971   BTL     PARTIAL HYSTERECTOMY  1973   TONSILECTOMY, ADENOIDECTOMY, BILATERAL MYRINGOTOMY AND TUBES  1950   VESICOVAGINAL FISTULA CLOSURE W/ TAH     Family History  Problem Relation Age of Onset   Cervical cancer Mother    Cancer Mother    Diabetes Father    Diabetes Sister    Diabetes Brother    Other Brother        killed   Breast cancer Neg Hx    Social History   Socioeconomic History   Marital status: Divorced    Spouse name: Not on file   Number of children: 5   Years of education: Not on file   Highest education level: Not on file  Occupational History   Occupation: retired  Tobacco Use   Smoking status: Never   Smokeless tobacco: Never  Vaping Use   Vaping  Use: Never used  Substance and Sexual Activity   Alcohol use: Yes    Comment: occassionally   Drug use: No   Sexual activity: Not Currently    Birth control/protection: Surgical  Other Topics Concern   Not on file  Social History Narrative   Lives alone.   Social Determinants of Health   Financial Resource Strain: Medium Risk (07/30/2021)   Overall Financial Resource Strain (CARDIA)    Difficulty of Paying Living Expenses: Somewhat hard  Food Insecurity: Food Insecurity Present (07/30/2021)   Hunger Vital Sign    Worried About Running Out of Food in the Last Year: Sometimes true    Ran Out of Food in the Last Year: Sometimes true  Transportation Needs: No Transportation Needs (07/30/2021)   PRAPARE - Administrator, Civil Service (Medical): No    Lack of Transportation (Non-Medical): No  Physical Activity: Inactive (07/30/2021)   Exercise Vital Sign    Days of Exercise per Week: 0 days    Minutes of Exercise per Session: 0 min  Stress: No Stress Concern Present (07/30/2021)   [employer] of Occupational Health - Occupational Stress Questionnaire    Feeling of Stress : Not at all  Social Connections: Not on file    Tobacco Counseling Counseling given: Not Answered   Clinical Intake:  Pre-visit preparation completed: Yes  Pain : 0-10 Pain Score: 6  Pain Type: Acute pain Pain Location: Leg Pain Orientation: Right Pain Descriptors / Indicators: Aching Pain Onset: More than a month ago Pain Frequency: Constant     Nutritional Status: BMI 25 -29 Overweight Nutritional Risks: None Diabetes: Yes  How often do you need to have someone help you when you read instructions, pamphlets, or other written materials from your doctor or pharmacy?: 1 - Never  Diabetic? Yes Nutrition Risk Assessment:  Has the patient had any N/V/D within the last 2 months?  No  Does the patient have any non-healing wounds?  No  Has the patient had any unintentional weight loss  or weight gain?  No   Diabetes:  Is the patient diabetic?  Yes  If diabetic, was a CBG obtained today?  No  Did the patient bring in their glucometer from home?  No  How often do you monitor your CBG's? Does not.   Financial Strains and Diabetes Management:  Are you having any financial strains with the device, your supplies or your medication? No .  Does the patient want to be seen by Chronic Care  Management for management of their diabetes?  No  Would the patient like to be referred to a Nutritionist or for Diabetic Management?  No   Diabetic Exams:  Diabetic Eye Exam: Overdue for diabetic eye exam. Pt has been advised about the importance in completing this exam. Patient advised to call and schedule an eye exam. Diabetic Foot Exam: Overdue, Pt has been advised about the importance in completing this exam. Pt is scheduled for diabetic foot exam on next appointment.   Interpreter Needed?: No  Information entered by :: NAllen LPN   Activities of Daily Living    07/30/2021    9:27 AM  In your present state of health, do you have any difficulty performing the following activities:  Hearing? 0  Vision? 0  Difficulty concentrating or making decisions? 1  Walking or climbing stairs? 1  Dressing or bathing? 0  Doing errands, shopping? 0  Preparing Food and eating ? N  Using the Toilet? N  In the past six months, have you accidently leaked urine? Y  Do you have problems with loss of bowel control? N  Managing your Medications? N  Managing your Finances? N  Housekeeping or managing your Housekeeping? N    Patient Care Team: Dorothyann PengSanders, Robyn, MD as PCP - General (Internal Medicine) Caudill, Maryjane Hurterourtney K, [facility] (Inactive) (Pharmacist)  Indicate any recent Medical Services you may have received from other than [facility] providers in the past year (date may be approximate).     Assessment:   This is a routine wellness examination for Naleah.  Hearing/Vision screen Vision Screening  - Comments:: Regular eye exams, Dr. Hubbard RobinsonMcFarlane  Dietary issues and exercise activities discussed: Current Exercise Habits: The patient does not participate in regular exercise at present   Goals Addressed             This Visit's Progress    Patient Stated       07/30/2021, no goals       Depression Screen    07/30/2021    9:26 AM 06/25/2020   10:26 AM 06/21/2019   11:30 AM 09/28/2018    9:35 AM 04/24/2018   11:56 AM 01/26/2018   11:31 AM 12/22/2017    2:36 PM  PHQ 2/9 Scores  PHQ - 2 Score 1 0 0 1 0 0 0  PHQ- 9 Score   0 7       Fall Risk    07/30/2021    9:26 AM 06/25/2020   10:26 AM 06/21/2019   11:29 AM 10/12/2018    3:37 PM 09/28/2018    9:35 AM  Fall Risk   Falls in the past year? 0 0 0 1 0  Number falls in past yr: 0   0   Injury with Fall? 0      Risk for fall due to : Medication side effect Medication side effect Medication side effect  Medication side effect  Follow up Falls evaluation completed;Education provided;Falls prevention discussed Falls evaluation completed;Education provided;Falls prevention discussed Falls evaluation completed;Education provided;Falls prevention discussed  Falls prevention discussed;Education provided;Falls evaluation completed    FALL RISK PREVENTION PERTAINING TO THE HOME:  Any stairs in or around the home? Yes  If so, are there any without handrails? No  Home free of loose throw rugs in walkways, pet beds, electrical cords, etc? Yes  Adequate lighting in your home to reduce risk of falls? Yes   ASSISTIVE DEVICES UTILIZED TO PREVENT FALLS:  Life alert? No  Use of a cane, walker  or w/c? No  Grab bars in the bathroom? Yes  Shower chair or bench in shower? Yes  Elevated toilet seat or a handicapped toilet? Yes   TIMED UP AND GO:  Was the test performed? No .    Gait slow and steady without use of assistive device  Cognitive Function:        07/30/2021    9:28 AM 06/25/2020   10:28 AM 06/21/2019   11:34 AM 09/28/2018     9:38 AM  6CIT Screen  What Year? 0 points 0 points 0 points 0 points  What month? 0 points 0 points 0 points 0 points  What time? 0 points 0 points 3 points 0 points  Count back from 20 0 points 0 points 0 points 0 points  Months in reverse 2 points 0 points 0 points 0 points  Repeat phrase 10 points 10 points 0 points 0 points  Total Score 12 points 10 points 3 points 0 points    Immunizations Immunization History  Administered Date(s) Administered   DTaP 10/28/2015   Fluad Quad(high Dose 65+) 10/19/2018, 10/19/2019, 10/09/2020   Influenza Whole 10/11/2009   Influenza, High Dose Seasonal PF 10/21/2017   Influenza-Unspecified 10/19/2018   PFIZER(Purple Top)SARS-COV-2 Vaccination 02/19/2019, 03/16/2019, 12/11/2019, 06/04/2020   PNEUMOCOCCAL CONJUGATE-20 02/05/2021   Pfizer Covid-19 Vaccine Bivalent Booster 28yrs & up 07/08/2021   Pneumococcal-Unspecified 03/28/2013, 10/28/2015   Zoster Recombinat (Shingrix) 05/13/2016, 08/18/2016    TDAP status: Up to date  Flu Vaccine status: Up to date  Pneumococcal vaccine status: Up to date  Covid-19 vaccine status: Completed vaccines  Qualifies for Shingles Vaccine? Yes   Zostavax completed Yes   Shingrix Completed?: Yes  Screening Tests Health Maintenance  Topic Date Due   OPHTHALMOLOGY EXAM  06/19/2020   FOOT EXAM  12/23/2020   INFLUENZA VACCINE  08/11/2021   HEMOGLOBIN A1C  11/03/2021   COVID-19 Vaccine (6 - Pfizer series) 11/07/2021   TETANUS/TDAP  10/27/2025   Pneumonia Vaccine 70+ Years old  Completed   DEXA SCAN  Completed   Zoster Vaccines- Shingrix  Completed   HPV VACCINES  Aged Out    Health Maintenance  Health Maintenance Due  Topic Date Due   OPHTHALMOLOGY EXAM  06/19/2020   FOOT EXAM  12/23/2020    Colorectal cancer screening: No longer required.   Mammogram status: No longer required due to age.  Bone Density status: Completed 06/05/2020  Lung Cancer Screening: (Low Dose CT Chest recommended if Age  52-80 years, 30 pack-year currently smoking OR have quit w/in 15years.) does not qualify.   Lung Cancer Screening Referral: no  Additional Screening:  Hepatitis C Screening: does not qualify;   Vision Screening: Recommended annual ophthalmology exams for early detection of glaucoma and other disorders of the eye. Is the patient up to date with their annual eye exam?  No  Who is the provider or what is the name of the office in which the patient attends annual eye exams? Dr. Hubbard Robinson If pt is not established with a provider, would they like to be referred to a provider to establish care? No .   Dental Screening: Recommended annual dental exams for proper oral hygiene  Community Resource Referral / Chronic Care Management: CRR required this visit?  No   CCM required this visit?  No      Plan:     I have personally reviewed and noted the following in the patient's chart:   Medical and social history Use of alcohol,  tobacco or illicit drugs  Current medications and supplements including opioid prescriptions.  Functional ability and status Nutritional status Physical activity Advanced directives List of other physicians Hospitalizations, surgeries, and ER visits in previous 12 months Vitals Screenings to include cognitive, depression, and falls Referrals and appointments  In addition, I have reviewed and discussed with patient certain preventive protocols, quality metrics, and best practice recommendations. A written personalized care plan for preventive services as well as general preventive health recommendations were provided to patient.     Barb Merino, LPN   [id number]   Nurse Notes: none

## 2021-07-30 NOTE — Progress Notes (Signed)
Vickie Brown,acting as a Education administrator for Vickie Greenland, MD.,have documented all relevant documentation on the behalf of Vickie Greenland, MD,as directed by  Vickie Greenland, MD while in the presence of Vickie Greenland, MD.    Subjective:     Patient ID: Vickie Brown , female    DOB: [birth year] , 81 y.o.   MRN: [id number]   Chief Complaint  Patient presents with   Diabetes   Hypertension    HPI  Patient presents today for a diabetes follow up. She reports compliance with meds. She denies chest pain, shortness of breath and palpitations. She was recently seen in ER on 7/18 with chief complaint of right leg pain which had been ongoing for a couple of days.  Patient reported pain along the right calf, right ankle exacerbated with any ambulation along with weightbearing.  Did see urgent care the day prior and was sent to the ED to rule out a DVT.  She has been applying diclofenac cream to the right ankle, along with taking Tylenol or extra strength, however noted the pain to be so severe Sunday night that did not let her sleep.  No alleviating factors.  No prior history of blood clots, non-smoker, no fever, no trauma.  U/s was neg for DVT and ankle xray was neg for fracture.   She was also seen by [facility] Advisor for AWV today.  BP Readings from Last 3 Encounters: 07/30/21 : (!) 108/56 07/30/21 : (!) 108/56 07/28/21 : (!) 123/93    Diabetes She presents for her follow-up diabetic visit. She has type 2 diabetes mellitus. Her disease course has been stable. There are no hypoglycemic associated symptoms. Pertinent negatives for hypoglycemia include no headaches. Pertinent negatives for diabetes include no blurred vision, no chest pain, no polydipsia, no polyphagia and no polyuria. There are no hypoglycemic complications. Risk factors for coronary artery disease include diabetes mellitus, dyslipidemia, hypertension and post-menopausal. She is following a diabetic diet. Her breakfast blood glucose is  taken between 8-9 am. Her breakfast blood glucose range is generally 90-110 mg/dl.  Hypertension This is a chronic problem. The current episode started more than 1 year ago. The problem has been gradually improving since onset. Pertinent negatives include no blurred vision, chest pain, headaches, palpitations or shortness of breath.     Past Medical History:  Diagnosis Date   Chronic headaches    Diverticulitis    Hyperlipidemia    Hypertension    Knee pain    Type 2 diabetes mellitus ([place])      Family History  Problem Relation Age of Onset   Cervical cancer Mother    Cancer Mother    Diabetes Father    Diabetes Sister    Diabetes Brother    Other Brother        killed   Breast cancer Neg Hx      Current Outpatient Medications:    acetaminophen (TYLENOL) 325 MG tablet, Take 650 mg by mouth every 6 (six) hours as needed., Disp: , Rfl:    alendronate (FOSAMAX) 70 MG tablet, TAKE 1 TABLET(70 MG) BY MOUTH 1 TIME A WEEK WITH A FULL GLASS OF WATER AND ON AN EMPTY STOMACH (Patient not taking: Reported on 07/30/2021), Disp: 12 tablet, Rfl: 3   ascorbic acid (VITAMIN C) 500 MG tablet, Take 500 mg by mouth daily., Disp: , Rfl:    aspirin 81 MG tablet, Take 81 mg by mouth every evening., Disp: , Rfl:  benzonatate (TESSALON PERLES) 100 MG capsule, Take 1 capsule (100 mg total) by mouth every 6 (six) hours as needed for cough., Disp: 30 capsule, Rfl: 0   BIOTIN PO, Take by mouth daily., Disp: , Rfl:    Calcium Carbonate (CALCIUM 500 PO), Take by mouth., Disp: , Rfl:    cetirizine (ZYRTEC) 10 MG tablet, Take 10 mg by mouth at bedtime., Disp: , Rfl:    Cholecalciferol (VITAMIN D3) 2000 UNITS TABS, Take 1 tablet by mouth daily., Disp: , Rfl:    fluticasone (FLONASE) 50 MCG/ACT nasal spray, Place 1 spray into both nostrils at bedtime., Disp: 16 g, Rfl: 2   Homeopathic Products (OSCILLOCOCCINUM PO), Take by mouth., Disp: , Rfl:    hydrochlorothiazide (MICROZIDE) 12.5 MG capsule, TAKE 1  CAPSULE(12.5 MG) BY MOUTH EVERY MORNING, Disp: 90 capsule, Rfl: 1   ipratropium (ATROVENT) 0.03 % nasal spray, Place 2 sprays into the nose 3 (three) times daily. (Patient taking differently: Place 2 sprays into the nose as needed.), Disp: 30 mL, Rfl: 2   JANUVIA 100 MG tablet, TAKE 1 TABLET(100 MG) BY MOUTH DAILY, Disp: 90 tablet, Rfl: 1   losartan (COZAAR) 50 MG tablet, TAKE 1 TABLET(50 MG) BY MOUTH DAILY, Disp: 90 tablet, Rfl: 1   Multiple Vitamins-Minerals (ALIVE ONCE DAILY WOMENS 50+ PO), Take 1 tablet by mouth daily., Disp: , Rfl:    Omega-3 Fatty Acids (FISH OIL PO), Take by mouth., Disp: , Rfl:    pravastatin (PRAVACHOL) 40 MG tablet, TAKE 1 TABLET BY MOUTH DAILY, Disp: 90 tablet, Rfl: 1   Probiotic Product (ALIGN) 4 MG CAPS, Take 4 mg by mouth daily., Disp: , Rfl:    TURMERIC PO, Take by mouth., Disp: , Rfl:    UNABLE TO FIND, Med Name: Unkers therapeutic Rub, Disp: , Rfl:    vitamin B-12 (CYANOCOBALAMIN) 500 MCG tablet, Take 500 mcg by mouth daily., Disp: , Rfl:    No Known Allergies   Review of Systems  Constitutional: Negative.   Eyes:  Negative for blurred vision.  Respiratory: Negative.  Negative for shortness of breath.   Cardiovascular: Negative.  Negative for chest pain and palpitations.  Gastrointestinal: Negative.   Endocrine: Negative for polydipsia, polyphagia and polyuria.  Musculoskeletal:  Positive for myalgias.  Neurological: Negative.  Negative for headaches.  Psychiatric/Behavioral: Negative.       Today's Vitals   07/30/21 0936 07/30/21 1034  BP: (!) 108/56 110/64  Pulse: 90   Temp: 98.1 F (36.7 C)   TempSrc: Oral   Weight: 144 lb (65.3 kg)   Height: _0  (1.473 m)   PainSc: 0-No pain    Body mass index is 30.1 kg/m.  Wt Readings from Last 3 Encounters:  07/30/21 144 lb (65.3 kg)  07/30/21 144 lb 3.2 oz (65.4 kg)  05/04/21 146 lb 9.6 oz (66.5 kg)     Objective:  Physical Exam Vitals and nursing note reviewed.  Constitutional:       Appearance: Normal appearance.  HENT:     Head: Normocephalic and atraumatic.  Cardiovascular:     Rate and Rhythm: Normal rate and regular rhythm.     Heart sounds: Normal heart sounds.  Pulmonary:     Effort: Pulmonary effort is normal.     Breath sounds: Normal breath sounds.  Musculoskeletal:     Cervical back: Normal range of motion.     Comments: Left calf 13 inches, R calf 14 inches  Skin:    General: Skin is warm.  Neurological:     General: No focal deficit present.     Mental Status: She is alert.  Psychiatric:        Mood and Affect: Mood normal.        Behavior: Behavior normal.      Assessment And Plan:     1. Type 2 diabetes mellitus with stage 2 chronic kidney disease, without long-term current use of insulin (HCC) Comments: Chronic, I will request her most recent eye exam from Dr. Newman Nip. I will check labs as below. She will rto in 77month for re-evaluation.  - Hemoglobin A1c - Magnesium - CMP14+EGFR  2. Hypertensive nephropathy Comments: Chronic, well controlled. No med changes today. She will c/w losartan and hctz.   3. Muscle spasm of right calf Comments: ER records reviewed. Advised to apply pain cream to affected area tid prn. She was given several stretching exercises to perform daily. - Magnesium - CMP14+EGFR  4. BMI 29.0-29.9,adult Comments: She is encouraged to aim for at least 150 minutes per week, including chair exercises while watching TV.    Patient was given opportunity to ask questions. Patient verbalized understanding of the plan and was able to repeat key elements of the plan. All questions were answered to their satisfaction.   I, RMaximino Greenland MD, have reviewed all documentation for this visit. The documentation on 07/30/21 for the exam, diagnosis, procedures, and orders are all accurate and complete.   IF YOU HAVE BEEN REFERRED TO A SPECIALIST, IT MAY TAKE 1-2 WEEKS TO SCHEDULE/PROCESS THE REFERRAL. IF YOU HAVE NOT HEARD FROM  US/SPECIALIST IN TWO WEEKS, PLEASE GIVE [place] CALL AT [phone] X [phone].   THE PATIENT IS ENCOURAGED TO PRACTICE SOCIAL DISTANCING DUE TO THE COVID-19 PANDEMIC.

## 2021-07-30 NOTE — Patient Instructions (Signed)
Vickie Brown , Thank you for taking time to come for your Medicare Wellness Visit. I appreciate your ongoing commitment to your health goals. Please review the following plan we discussed and let me know if I can assist you in the future.   Screening recommendations/referrals: Colonoscopy: not required Mammogram: not required Bone Density: completed 06/05/2020 Recommended yearly ophthalmology/optometry visit for glaucoma screening and checkup Recommended yearly dental visit for hygiene and checkup  Vaccinations: Influenza vaccine: due 08/11/2021 Pneumococcal vaccine: completed 02/05/2021 Tdap vaccine: completed 10/28/2015, due 10/27/2025 Shingles vaccine: completed    Covid-19:07/08/2021, 06/04/2020, 12/11/2019, 03/16/2019, 02/19/2019  Advanced directives: Advance directive discussed with you today. Even though you declined this today please call our office should you change your mind and we can give you the proper paperwork for you to fill out.  Conditions/risks identified: none  Next appointment: Follow up in one year for your annual wellness visit    Preventive Care 65 Years and Older, Female Preventive care refers to lifestyle choices and visits with your health care provider that can promote health and wellness. What does preventive care include? A yearly physical exam. This is also called an annual well check. Dental exams once or twice a year. Routine eye exams. Ask your health care provider how often you should have your eyes checked. Personal lifestyle choices, including: Daily care of your teeth and gums. Regular physical activity. Eating a healthy diet. Avoiding tobacco and drug use. Limiting alcohol use. Practicing safe sex. Taking low-dose aspirin every day. Taking vitamin and mineral supplements as recommended by your health care provider. What happens during an annual well check? The services and screenings done by your health care provider during your annual well check will  depend on your age, overall health, lifestyle risk factors, and family history of disease. Counseling  Your health care provider may ask you questions about your: Alcohol use. Tobacco use. Drug use. Emotional well-being. Home and relationship well-being. Sexual activity. Eating habits. History of falls. Memory and ability to understand (cognition). Work and work Astronomer. Reproductive health. Screening  You may have the following tests or measurements: Height, weight, and BMI. Blood pressure. Lipid and cholesterol levels. These may be checked every 5 years, or more frequently if you are over 58 years old. Skin check. Lung cancer screening. You may have this screening every year starting at age 9 if you have a 30-pack-year history of smoking and currently smoke or have quit within the past 15 years. Fecal occult blood test (FOBT) of the stool. You may have this test every year starting at age 55. Flexible sigmoidoscopy or colonoscopy. You may have a sigmoidoscopy every 5 years or a colonoscopy every 10 years starting at age 57. Hepatitis C blood test. Hepatitis B blood test. Sexually transmitted disease (STD) testing. Diabetes screening. This is done by checking your blood sugar (glucose) after you have not eaten for a while (fasting). You may have this done every 1-3 years. Bone density scan. This is done to screen for osteoporosis. You may have this done starting at age 31. Mammogram. This may be done every 1-2 years. Talk to your health care provider about how often you should have regular mammograms. Talk with your health care provider about your test results, treatment options, and if necessary, the need for more tests. Vaccines  Your health care provider may recommend certain vaccines, such as: Influenza vaccine. This is recommended every year. Tetanus, diphtheria, and acellular pertussis (Tdap, Td) vaccine. You may need a Td booster every  10 years. Zoster vaccine. You may  need this after age 49. Pneumococcal 13-valent conjugate (PCV13) vaccine. One dose is recommended after age 68. Pneumococcal polysaccharide (PPSV23) vaccine. One dose is recommended after age 51. Talk to your health care provider about which screenings and vaccines you need and how often you need them. This information is not intended to replace advice given to you by your health care provider. Make sure you discuss any questions you have with your health care provider. Document Released: 01/24/2015 Document Revised: 09/17/2015 Document Reviewed: 10/29/2014 Elsevier Interactive Patient Education  2017 [employer] Prevention in the Home Falls can cause injuries. They can happen to people of all ages. There are many things you can do to make your home safe and to help prevent falls. What can I do on the outside of my home? Regularly fix the edges of walkways and driveways and fix any cracks. Remove anything that might make you trip as you walk through a door, such as a raised step or threshold. Trim any bushes or trees on the path to your home. Use bright outdoor lighting. Clear any walking paths of anything that might make someone trip, such as rocks or tools. Regularly check to see if handrails are loose or broken. Make sure that both sides of any steps have handrails. Any raised decks and porches should have guardrails on the edges. Have any leaves, snow, or ice cleared regularly. Use sand or salt on walking paths during winter. Clean up any spills in your garage right away. This includes oil or grease spills. What can I do in the bathroom? Use night lights. Install grab bars by the toilet and in the tub and shower. Do not use towel bars as grab bars. Use non-skid mats or decals in the tub or shower. If you need to sit down in the shower, use a plastic, non-slip stool. Keep the floor dry. Clean up any water that spills on the floor as soon as it happens. Remove soap buildup in  the tub or shower regularly. Attach bath mats securely with double-sided non-slip rug tape. Do not have throw rugs and other things on the floor that can make you trip. What can I do in the bedroom? Use night lights. Make sure that you have a light by your bed that is easy to reach. Do not use any sheets or blankets that are too big for your bed. They should not hang down onto the floor. Have a firm chair that has side arms. You can use this for support while you get dressed. Do not have throw rugs and other things on the floor that can make you trip. What can I do in the kitchen? Clean up any spills right away. Avoid walking on wet floors. Keep items that you use a lot in easy-to-reach places. If you need to reach something above you, use a strong step stool that has a grab bar. Keep electrical cords out of the way. Do not use floor polish or wax that makes floors slippery. If you must use wax, use non-skid floor wax. Do not have throw rugs and other things on the floor that can make you trip. What can I do with my stairs? Do not leave any items on the stairs. Make sure that there are handrails on both sides of the stairs and use them. Fix handrails that are broken or loose. Make sure that handrails are as long as the stairways. Check any carpeting to make  sure that it is firmly attached to the stairs. Fix any carpet that is loose or worn. Avoid having throw rugs at the top or bottom of the stairs. If you do have throw rugs, attach them to the floor with carpet tape. Make sure that you have a light switch at the top of the stairs and the bottom of the stairs. If you do not have them, ask someone to add them for you. What else can I do to help prevent falls? Wear shoes that: Do not have high heels. Have rubber bottoms. Are comfortable and fit you well. Are closed at the toe. Do not wear sandals. If you use a stepladder: Make sure that it is fully opened. Do not climb a closed  stepladder. Make sure that both sides of the stepladder are locked into place. Ask someone to hold it for you, if possible. Clearly mark and make sure that you can see: Any grab bars or handrails. First and last steps. Where the edge of each step is. Use tools that help you move around (mobility aids) if they are needed. These include: Canes. Walkers. Scooters. Crutches. Turn on the lights when you go into a dark area. Replace any light bulbs as soon as they burn out. Set up your furniture so you have a clear path. Avoid moving your furniture around. If any of your floors are uneven, fix them. If there are any pets around you, be aware of where they are. Review your medicines with your doctor. Some medicines can make you feel dizzy. This can increase your chance of falling. Ask your doctor what other things that you can do to help prevent falls. This information is not intended to replace advice given to you by your health care provider. Make sure you discuss any questions you have with your health care provider. Document Released: 10/24/2008 Document Revised: 06/05/2015 Document Reviewed: 02/01/2014 Elsevier Interactive Patient Education  2017 [employer].

## 2021-07-30 NOTE — Telephone Encounter (Signed)
   Telephone encounter was:  Unsuccessful.  07/30/2021 Name: ANALIZ TVEDT MRN: [id number] DOB: Sep 15, 1940  Unsuccessful outbound call made today to assist with:  Financial Difficulties related to financial strain  Outreach Attempt:  1st Attempt  A HIPAA compliant voice message was left requesting a return call.  Instructed patient to call back at  earliest convenience. Lenard Forth Care Guide, Embedded Care Coordination [facility], Care Management  [phone] [address] [place], [place], [place] [address] Phone: [phone] Email: [email or web address]

## 2021-07-31 LAB — HEMOGLOBIN A1C
Est. average glucose Bld gHb Est-mCnc: 134 mg/dL
Hgb A1c MFr Bld: 6.3 % — ABNORMAL HIGH (ref 4.8–5.6)

## 2021-07-31 LAB — CMP14+EGFR
ALT: 19 IU/L (ref 0–32)
AST: 23 IU/L (ref 0–40)
Albumin/Globulin Ratio: 1.7 (ref 1.2–2.2)
Albumin: 4.5 g/dL (ref 3.7–4.7)
Alkaline Phosphatase: 47 IU/L (ref 44–121)
BUN/Creatinine Ratio: 9 — ABNORMAL LOW (ref 12–28)
BUN: 8 mg/dL (ref 8–27)
Bilirubin Total: 0.2 mg/dL (ref 0.0–1.2)
CO2: 28 mmol/L (ref 20–29)
Calcium: 10 mg/dL (ref 8.7–10.3)
Chloride: 97 mmol/L (ref 96–106)
Creatinine, Ser: 0.93 mg/dL (ref 0.57–1.00)
Globulin, Total: 2.6 g/dL (ref 1.5–4.5)
Glucose: 98 mg/dL (ref 70–99)
Potassium: 4.9 mmol/L (ref 3.5–5.2)
Sodium: 137 mmol/L (ref 134–144)
Total Protein: 7.1 g/dL (ref 6.0–8.5)
eGFR: 61 mL/min/{1.73_m2} (ref >59)

## 2021-07-31 LAB — MAGNESIUM: Magnesium: 2.1 mg/dL (ref 1.6–2.3)

## 2021-08-03 ENCOUNTER — Telehealth: Payer: Self-pay

## 2021-08-03 NOTE — Telephone Encounter (Signed)
   Telephone encounter was:  Unsuccessful.  08/03/2021 Name: ROSIE TORREZ MRN: [id number] DOB: October 30, 1940  Unsuccessful outbound call made today to assist with:  Financial Difficulties related to FINANCIAL STRAIN  Outreach Attempt:  2nd Attempt  A HIPAA compliant voice message was left requesting a return call.  Instructed patient to call back at earliest convenience. Lenard Forth Care Guide, Embedded Care Coordination [facility], Care Management  [phone] [address] [place], [place], [place] [address] Phone: [phone] Email: [email or web address]

## 2021-08-03 NOTE — Telephone Encounter (Signed)
   Telephone encounter was:  Successful.  08/03/2021 Name: Vickie Brown MRN: [id number] DOB: 08-22-1940  Vickie Brown is a 81 y.o. year old female who is a primary care patient of Dorothyann Peng, MD . The community resource team was consulted for assistance with Food Insecurity and Financial Difficulties related to financial strain  Care guide performed the following interventions: Patient provided with information about care guide support team and interviewed to confirm resource needs.Patient stated she is having issues paying utilities and buying food. Patient has my contact information if needed Follow Up Plan:  No further follow up planned at this time. The patient has been provided with needed resources.    Lenard Forth Care Guide, Embedded Care Coordination [facility], Care Management  [phone] [address] [place], [place], [place] [address] Phone: [phone] Email: [email or web address]

## 2021-09-08 ENCOUNTER — Telehealth: Payer: Medicare Other

## 2021-09-25 ENCOUNTER — Other Ambulatory Visit: Payer: Self-pay | Admitting: Internal Medicine

## 2021-10-13 DIAGNOSIS — E119 Type 2 diabetes mellitus without complications: Secondary | ICD-10-CM | POA: Diagnosis not present

## 2021-10-13 DIAGNOSIS — E1165 Type 2 diabetes mellitus with hyperglycemia: Secondary | ICD-10-CM | POA: Diagnosis not present

## 2021-10-22 ENCOUNTER — Encounter: Payer: Medicare Other | Admitting: Internal Medicine

## 2021-10-30 ENCOUNTER — Encounter: Payer: Medicare Other | Admitting: Internal Medicine

## 2021-10-30 ENCOUNTER — Encounter: Payer: Self-pay | Admitting: Internal Medicine

## 2021-10-30 NOTE — Progress Notes (Deleted)
Subjective:     Patient ID: Vickie Brown , female    DOB: November 21, 1940 , 81 y.o.   MRN: [id number]   No chief complaint on file.   HPI  Patient presents today for a full physical exam. She reports compliance with meds. She denies headaches, chest pain, palpitations and shortness of breath.   Diabetes She presents for her follow-up diabetic visit. She has type 2 diabetes mellitus. Her disease course has been stable. Pertinent negatives for diabetes include no blurred vision and no chest pain. There are no hypoglycemic complications. Risk factors for coronary artery disease include diabetes mellitus, dyslipidemia, hypertension and post-menopausal. She is following a diabetic diet. She participates in exercise intermittently. Her breakfast blood glucose is taken between 8-9 am. Her breakfast blood glucose range is generally 90-110 mg/dl. An ACE inhibitor/angiotensin II receptor blocker is being taken. She does not see a podiatrist. Hypertension This is a chronic problem. The current episode started more than 1 year ago. The problem has been gradually improving since onset. Pertinent negatives include no blurred vision, chest pain, palpitations or shortness of breath. The current treatment provides moderate improvement. Hypertensive end-organ damage includes kidney disease. Identifiable causes of hypertension include chronic renal disease.     Past Medical History:  Diagnosis Date   Chronic headaches    Diverticulitis    Hyperlipidemia    Hypertension    Knee pain    Type 2 diabetes mellitus (HCC)      Family History  Problem Relation Age of Onset   Cervical cancer Mother    Cancer Mother    Diabetes Father    Diabetes Sister    Diabetes Brother    Other Brother        killed   Breast cancer Neg Hx      Current Outpatient Medications:    acetaminophen (TYLENOL) 325 MG tablet, Take 650 mg by mouth every 6 (six) hours as needed., Disp: , Rfl:    alendronate (FOSAMAX) 70 MG  tablet, TAKE 1 TABLET(70 MG) BY MOUTH 1 TIME A WEEK WITH A FULL GLASS OF WATER AND ON AN EMPTY STOMACH (Patient not taking: Reported on 07/30/2021), Disp: 12 tablet, Rfl: 3   ascorbic acid (VITAMIN C) 500 MG tablet, Take 500 mg by mouth daily., Disp: , Rfl:    aspirin 81 MG tablet, Take 81 mg by mouth every evening., Disp: , Rfl:    BIOTIN PO, Take by mouth daily., Disp: , Rfl:    Calcium Carbonate (CALCIUM 500 PO), Take by mouth., Disp: , Rfl:    cetirizine (ZYRTEC) 10 MG tablet, Take 10 mg by mouth at bedtime., Disp: , Rfl:    Cholecalciferol (VITAMIN D3) 2000 UNITS TABS, Take 1 tablet by mouth daily., Disp: , Rfl:    fluticasone (FLONASE) 50 MCG/ACT nasal spray, Place 1 spray into both nostrils at bedtime., Disp: 16 g, Rfl: 2   Homeopathic Products (OSCILLOCOCCINUM PO), Take by mouth., Disp: , Rfl:    hydrochlorothiazide (MICROZIDE) 12.5 MG capsule, TAKE 1 CAPSULE(12.5 MG) BY MOUTH EVERY MORNING, Disp: 90 capsule, Rfl: 1   ipratropium (ATROVENT) 0.03 % nasal spray, Place 2 sprays into the nose 3 (three) times daily. (Patient taking differently: Place 2 sprays into the nose as needed.), Disp: 30 mL, Rfl: 2   JANUVIA 100 MG tablet, TAKE 1 TABLET(100 MG) BY MOUTH DAILY, Disp: 90 tablet, Rfl: 1   losartan (COZAAR) 50 MG tablet, TAKE 1 TABLET(50 MG) BY MOUTH DAILY, Disp: 90 tablet, Rfl:  1   Multiple Vitamins-Minerals (ALIVE ONCE DAILY WOMENS 50+ PO), Take 1 tablet by mouth daily., Disp: , Rfl:    Omega-3 Fatty Acids (FISH OIL PO), Take by mouth., Disp: , Rfl:    pravastatin (PRAVACHOL) 40 MG tablet, TAKE 1 TABLET BY MOUTH DAILY, Disp: 90 tablet, Rfl: 1   Probiotic Product (ALIGN) 4 MG CAPS, Take 4 mg by mouth daily., Disp: , Rfl:    TURMERIC PO, Take by mouth., Disp: , Rfl:    UNABLE TO FIND, Med Name: Unkers therapeutic Rub, Disp: , Rfl:    vitamin B-12 (CYANOCOBALAMIN) 500 MCG tablet, Take 500 mcg by mouth daily., Disp: , Rfl:    No Known Allergies    The patient states she uses {contraceptive  methods:5051} for birth control. Last LMP was No LMP recorded. Patient is postmenopausal.. {Dysmenorrhea-menorrhagia:21918}. Negative for: breast discharge, breast lump(s), breast pain and breast self exam. Associated symptoms include abnormal vaginal bleeding. Pertinent negatives include abnormal bleeding (hematology), anxiety, decreased libido, depression, difficulty falling sleep, dyspareunia, history of infertility, nocturia, sexual dysfunction, sleep disturbances, urinary incontinence, urinary urgency, vaginal discharge and vaginal itching. Diet regular.The patient states her exercise level is    . The patient's tobacco use is:  Social History   Tobacco Use  Smoking Status Never  Smokeless Tobacco Never  . She has been exposed to passive smoke. The patient's alcohol use is:  Social History   Substance and Sexual Activity  Alcohol Use Yes   Comment: occassionally  . Additional information: Last pap ***, next one scheduled for ***.    Review of Systems  Constitutional: Negative.   HENT: Negative.    Eyes: Negative.  Negative for blurred vision.  Respiratory: Negative.  Negative for shortness of breath.   Cardiovascular: Negative.  Negative for chest pain and palpitations.  Gastrointestinal: Negative.   Endocrine: Negative.   Genitourinary: Negative.   Musculoskeletal: Negative.   Skin: Negative.   Allergic/Immunologic: Negative.   Neurological: Negative.   Hematological: Negative.   Psychiatric/Behavioral: Negative.       There were no vitals filed for this visit. There is no height or weight on file to calculate BMI.   Objective:  Physical Exam      Assessment And Plan:     There are no diagnoses linked to this encounter.    Patient was given opportunity to ask questions. Patient verbalized understanding of the plan and was able to repeat key elements of the plan. All questions were answered to their satisfaction.   Tonie Griffith, [place], Tonie Griffith, CMA,  have reviewed all documentation for this visit. The documentation on 10/30/21 for the exam, diagnosis, procedures, and orders are all accurate and complete.   THE PATIENT IS ENCOURAGED TO PRACTICE SOCIAL DISTANCING DUE TO THE COVID-19 PANDEMIC.

## 2021-10-31 NOTE — Progress Notes (Signed)
No show

## 2021-11-09 ENCOUNTER — Ambulatory Visit (INDEPENDENT_AMBULATORY_CARE_PROVIDER_SITE_OTHER): Payer: Medicare Other | Admitting: Internal Medicine

## 2021-11-09 ENCOUNTER — Encounter: Payer: Self-pay | Admitting: Internal Medicine

## 2021-11-09 VITALS — BP 120/76 | HR 92 | Temp 98.4°F | Ht <= 58 in | Wt 142.8 lb

## 2021-11-09 DIAGNOSIS — E663 Overweight: Secondary | ICD-10-CM

## 2021-11-09 DIAGNOSIS — Z0000 Encounter for general adult medical examination without abnormal findings: Secondary | ICD-10-CM | POA: Diagnosis not present

## 2021-11-09 DIAGNOSIS — I129 Hypertensive chronic kidney disease with stage 1 through stage 4 chronic kidney disease, or unspecified chronic kidney disease: Secondary | ICD-10-CM | POA: Diagnosis not present

## 2021-11-09 DIAGNOSIS — Z23 Encounter for immunization: Secondary | ICD-10-CM

## 2021-11-09 DIAGNOSIS — N182 Chronic kidney disease, stage 2 (mild): Secondary | ICD-10-CM

## 2021-11-09 DIAGNOSIS — M170 Bilateral primary osteoarthritis of knee: Secondary | ICD-10-CM | POA: Diagnosis not present

## 2021-11-09 DIAGNOSIS — E1122 Type 2 diabetes mellitus with diabetic chronic kidney disease: Secondary | ICD-10-CM | POA: Diagnosis not present

## 2021-11-09 DIAGNOSIS — W19XXXA Unspecified fall, initial encounter: Secondary | ICD-10-CM

## 2021-11-09 LAB — POCT URINALYSIS DIPSTICK
Bilirubin, UA: NEGATIVE
Blood, UA: NEGATIVE
Glucose, UA: NEGATIVE
Ketones, UA: NEGATIVE
Leukocytes, UA: NEGATIVE
Nitrite, UA: NEGATIVE
Protein, UA: POSITIVE — AB
Spec Grav, UA: 1.01 (ref 1.010–1.025)
Urobilinogen, UA: 0.2 E.U./dL
pH, UA: 5.5 (ref 5.0–8.0)

## 2021-11-09 MED ORDER — LOSARTAN POTASSIUM 50 MG PO TABS: ORAL_TABLET | ORAL | 1 refills | Status: DC

## 2021-11-09 NOTE — Patient Instructions (Signed)

## 2021-11-09 NOTE — Progress Notes (Signed)
Subjective:     Patient ID: Vickie Brown , female    DOB: 1940-02-20 , 81 y.o.   MRN: [id number]   Chief Complaint  Patient presents with   Annual Exam   Diabetes   Hypertension    HPI  Patient presents today for a full physical exam. She reports compliance with meds. She denies headaches, chest pain, palpitations and shortness of breath. Patient states her back and knees hurt, she had a fall yesterday where she fell on her knees. She states she was walking around her couch, tripped and fell to the ground. She fell onto her knees. She did not hit her head or lose consciousness.  She is not quite clear why she fell. Denies having throw rugs. She c/o knee pain, but admits her knees always hurt. She has no other concerns at this time. She states she has a busy week, she has family coming to town this week for [facility]. She plans to do a lot of cooking.   Diabetes She presents for her follow-up diabetic visit. She has type 2 diabetes mellitus. Her disease course has been stable. Pertinent negatives for diabetes include no blurred vision and no chest pain. There are no hypoglycemic complications. Risk factors for coronary artery disease include diabetes mellitus, dyslipidemia, hypertension and post-menopausal. She is following a diabetic diet. She participates in exercise intermittently. Her breakfast blood glucose is taken between 8-9 am. Her breakfast blood glucose range is generally 90-110 mg/dl. An ACE inhibitor/angiotensin II receptor blocker is being taken. She does not see a podiatrist. Hypertension This is a chronic problem. The current episode started more than 1 year ago. The problem has been gradually improving since onset. Pertinent negatives include no blurred vision, chest pain, palpitations or shortness of breath. The current treatment provides moderate improvement. Hypertensive end-organ damage includes kidney disease. Identifiable causes of hypertension include chronic renal disease.      Past Medical History:  Diagnosis Date   Chronic headaches    Diverticulitis    Hyperlipidemia    Hypertension    Knee pain    Type 2 diabetes mellitus ([place])      Family History  Problem Relation Age of Onset   Cervical cancer Mother    Cancer Mother    Diabetes Father    Diabetes Sister    Diabetes Brother    Other Brother        killed   Breast cancer Neg Hx      Current Outpatient Medications:    acetaminophen (TYLENOL) 325 MG tablet, Take 650 mg by mouth every 6 (six) hours as needed., Disp: , Rfl:    ascorbic acid (VITAMIN C) 500 MG tablet, Take 500 mg by mouth daily., Disp: , Rfl:    aspirin 81 MG tablet, Take 81 mg by mouth every evening., Disp: , Rfl:    BIOTIN PO, Take by mouth daily., Disp: , Rfl:    Calcium Carbonate (CALCIUM 500 PO), Take by mouth., Disp: , Rfl:    cetirizine (ZYRTEC) 10 MG tablet, Take 10 mg by mouth at bedtime., Disp: , Rfl:    Cholecalciferol (VITAMIN D3) 2000 UNITS TABS, Take 1 tablet by mouth daily., Disp: , Rfl:    fluticasone (FLONASE) 50 MCG/ACT nasal spray, Place 1 spray into both nostrils at bedtime., Disp: 16 g, Rfl: 2   Homeopathic Products (OSCILLOCOCCINUM PO), Take by mouth., Disp: , Rfl:    hydrochlorothiazide (MICROZIDE) 12.5 MG capsule, TAKE 1 CAPSULE(12.5 MG) BY MOUTH EVERY MORNING,  Disp: 90 capsule, Rfl: 1   JANUVIA 100 MG tablet, TAKE 1 TABLET(100 MG) BY MOUTH DAILY, Disp: 90 tablet, Rfl: 1   Multiple Vitamins-Minerals (ALIVE ONCE DAILY WOMENS 50+ PO), Take 1 tablet by mouth daily., Disp: , Rfl:    Omega-3 Fatty Acids (FISH OIL PO), Take by mouth., Disp: , Rfl:    pravastatin (PRAVACHOL) 40 MG tablet, TAKE 1 TABLET BY MOUTH DAILY, Disp: 90 tablet, Rfl: 1   Probiotic Product (ALIGN) 4 MG CAPS, Take 4 mg by mouth daily., Disp: , Rfl:    TURMERIC PO, Take by mouth., Disp: , Rfl:    UNABLE TO FIND, Med Name: Unkers therapeutic Rub, Disp: , Rfl:    vitamin B-12 (CYANOCOBALAMIN) 500 MCG tablet, Take 500 mcg by mouth daily., Disp:  , Rfl:    alendronate (FOSAMAX) 70 MG tablet, TAKE 1 TABLET(70 MG) BY MOUTH 1 TIME A WEEK WITH A FULL GLASS OF WATER AND ON AN EMPTY STOMACH (Patient not taking: Reported on 07/30/2021), Disp: 12 tablet, Rfl: 3   ipratropium (ATROVENT) 0.03 % nasal spray, Place 2 sprays into the nose 3 (three) times daily. (Patient taking differently: Place 2 sprays into the nose as needed.), Disp: 30 mL, Rfl: 2   losartan (COZAAR) 50 MG tablet, TAKE 1 TABLET(50 MG) BY MOUTH DAILY, Disp: 90 tablet, Rfl: 1   No Known Allergies    The patient states she uses post menopausal status for birth control. Last LMP was No LMP recorded. Patient is postmenopausal.. Negative for Dysmenorrhea. Negative for: breast discharge, breast lump(s), breast pain and breast self exam. Associated symptoms include abnormal vaginal bleeding. Pertinent negatives include abnormal bleeding (hematology), anxiety, decreased libido, depression, difficulty falling sleep, dyspareunia, history of infertility, nocturia, sexual dysfunction, sleep disturbances, urinary incontinence, urinary urgency, vaginal discharge and vaginal itching. Diet regular.The patient states her exercise level is    . The patient's tobacco use is:  Social History   Tobacco Use  Smoking Status Never  Smokeless Tobacco Never  . She has been exposed to passive smoke. The patient's alcohol use is:  Social History   Substance and Sexual Activity  Alcohol Use Yes   Comment: occassionally   Review of Systems  Constitutional: Negative.   HENT: Negative.    Eyes: Negative.  Negative for blurred vision.  Respiratory: Negative.  Negative for shortness of breath.   Cardiovascular: Negative.  Negative for chest pain and palpitations.  Endocrine: Negative.   Genitourinary: Negative.   Musculoskeletal:  Positive for arthralgias.  Skin: Negative.   Allergic/Immunologic: Negative.   Neurological: Negative.   Hematological: Negative.   Psychiatric/Behavioral: Negative.        Today's Vitals   11/09/21 1519  BP: 120/76  Pulse: 92  Temp: 98.4 F (36.9 C)  TempSrc: Oral  Weight: 142 lb 12.8 oz (64.8 kg)  Height: _0  (1.473 m)   Body mass index is 29.85 kg/m.   Objective:  Physical Exam Vitals and nursing note reviewed.  Constitutional:      Appearance: Normal appearance.  HENT:     Head: Normocephalic and atraumatic.     Right Ear: Tympanic membrane, ear canal and external ear normal.     Left Ear: Tympanic membrane, ear canal and external ear normal.     Nose:     Comments: Masked     Mouth/Throat:     Comments: Masked  Eyes:     Extraocular Movements: Extraocular movements intact.     Conjunctiva/sclera: Conjunctivae normal.  Pupils: Pupils are equal, round, and reactive to light.  Cardiovascular:     Rate and Rhythm: Normal rate and regular rhythm.     Pulses: Normal pulses.          Dorsalis pedis pulses are 2+ on the right side and 2+ on the left side.     Heart sounds: Normal heart sounds.  Pulmonary:     Effort: Pulmonary effort is normal.     Breath sounds: Normal breath sounds.  Chest:  Breasts:    Tanner Score is 5.     Right: Normal.     Left: Normal.  Abdominal:     General: Abdomen is flat. Bowel sounds are normal.     Palpations: Abdomen is soft.  Genitourinary:    Comments: deferred Musculoskeletal:        General: Normal range of motion.     Cervical back: Normal range of motion and neck supple.  Feet:     Right foot:     Protective Sensation: 5 sites tested.  5 sites sensed.     Skin integrity: Dry skin present.     Toenail Condition: Right toenails are normal.     Left foot:     Protective Sensation: 5 sites tested.  5 sites sensed.     Skin integrity: Dry skin present.     Toenail Condition: Left toenails are normal.  Skin:    General: Skin is warm and dry.  Neurological:     General: No focal deficit present.     Mental Status: She is alert and oriented to person, place, and time.  Psychiatric:         Mood and Affect: Mood normal.        Behavior: Behavior normal.      Assessment And Plan:     1. Annual physical exam Comments: A full exam was performed. Importance of monthly self breast exams was discussed with the patient. PATIENT IS ADVISED TO GET 30-45 MINUTES REGULAR EXERCISE NO LESS THAN FOUR TO FIVE DAYS PER WEEK - BOTH WEIGHTBEARING EXERCISES AND AEROBIC ARE RECOMMENDED.  PATIENT IS ADVISED TO FOLLOW A HEALTHY DIET WITH AT LEAST SIX FRUITS/VEGGIES PER DAY, DECREASE INTAKE OF RED MEAT, AND TO INCREASE FISH INTAKE TO TWO DAYS PER WEEK.  MEATS/FISH SHOULD NOT BE FRIED, BAKED OR BROILED IS PREFERABLE.  IT IS ALSO IMPORTANT TO CUT BACK ON YOUR SUGAR INTAKE. PLEASE AVOID ANYTHING WITH ADDED SUGAR, CORN SYRUP OR OTHER SWEETENERS. IF YOU MUST USE A SWEETENER, YOU CAN TRY STEVIA. IT IS ALSO IMPORTANT TO AVOID ARTIFICIALLY SWEETENERS AND DIET BEVERAGES. LASTLY, I SUGGEST WEARING SPF 50 SUNSCREEN ON EXPOSED PARTS AND ESPECIALLY WHEN IN THE DIRECT SUNLIGHT FOR AN EXTENDED PERIOD OF TIME.  PLEASE AVOID FAST FOOD RESTAURANTS AND INCREASE YOUR WATER INTAKE.  2. Type 2 diabetes mellitus with stage 2 chronic kidney disease, without long-term current use of insulin (HCC) Comments: Chronic, diabetic foot exam was performed. She is scheduled for eye exam in Nov 2023.  She will rto in 4 months for re-evaluation. I DISCUSSED WITH THE PATIENT AT LENGTH REGARDING THE GOALS OF GLYCEMIC CONTROL AND POSSIBLE LONG-TERM COMPLICATIONS.  I  ALSO STRESSED THE IMPORTANCE OF COMPLIANCE WITH HOME GLUCOSE MONITORING, DIETARY RESTRICTIONS INCLUDING AVOIDANCE OF SUGARY DRINKS/PROCESSED FOODS,  ALONG WITH REGULAR EXERCISE.  I  ALSO STRESSED THE IMPORTANCE OF ANNUAL EYE EXAMS, SELF FOOT CARE AND COMPLIANCE WITH OFFICE VISITS.  - EKG 12-Lead - Microalbumin / Creatinine Urine Ratio - POCT Urinalysis  Dipstick (81002) - CMP14+EGFR - Hemoglobin A1c - Lipid panel  3. Hypertensive nephropathy Comments: Chronic, well controlled.  EKG performed, NSR w/ low voltage in limb leads. She is encouraged to follow low sodium diet. She will f/u in 4 months.  - CBC no Diff - CMP14+EGFR - Lipid panel - losartan (COZAAR) 50 MG tablet; TAKE 1 TABLET(50 MG) BY MOUTH DAILY  Dispense: 90 tablet; Refill: 1  4. Fall, initial encounter Comments: Occurred yesterday, 11/08/21. She did not injure herself, advised to remove any objects from around her furniture.   5. Bilateral primary osteoarthritis of knee Comments: Advised to take Tylenol prn. She reports she can't afford to take daily, she was given samples to take twice daily prn.   6. Need for influenza vaccination - Flu Vaccine QUAD High Dose(Fluad)  7. Overweight with body mass index (BMI) 25.0-29.9 Comments: BMI 29, this is acceptable for her demographic. She is encouraged to increase her daily activity. Will consider PREP referral.   Patient was given opportunity to ask questions. Patient verbalized understanding of the plan and was able to repeat key elements of the plan. All questions were answered to their satisfaction.   I, Maximino Greenland, MD, have reviewed all documentation for this visit. The documentation on 11/09/21 for the exam, diagnosis, procedures, and orders are all accurate and complete.   THE PATIENT IS ENCOURAGED TO PRACTICE SOCIAL DISTANCING DUE TO THE COVID-19 PANDEMIC.

## 2021-11-10 LAB — CMP14+EGFR
ALT: 18 IU/L (ref 0–32)
AST: 28 IU/L (ref 0–40)
Albumin/Globulin Ratio: 1.8 (ref 1.2–2.2)
Albumin: 4.3 g/dL (ref 3.7–4.7)
Alkaline Phosphatase: 53 IU/L (ref 44–121)
BUN/Creatinine Ratio: 8 — ABNORMAL LOW (ref 12–28)
BUN: 8 mg/dL (ref 8–27)
Bilirubin Total: <0.2 mg/dL (ref 0.0–1.2)
CO2: 29 mmol/L (ref 20–29)
Calcium: 9.7 mg/dL (ref 8.7–10.3)
Chloride: 102 mmol/L (ref 96–106)
Creatinine, Ser: 0.99 mg/dL (ref 0.57–1.00)
Globulin, Total: 2.4 g/dL (ref 1.5–4.5)
Glucose: 90 mg/dL (ref 70–99)
Potassium: 3.9 mmol/L (ref 3.5–5.2)
Sodium: 142 mmol/L (ref 134–144)
Total Protein: 6.7 g/dL (ref 6.0–8.5)
eGFR: 57 mL/min/{1.73_m2} — ABNORMAL LOW (ref >59)

## 2021-11-10 LAB — LIPID PANEL
Chol/HDL Ratio: 2.4 ratio (ref 0.0–4.4)
Cholesterol, Total: 148 mg/dL (ref 100–199)
HDL: 62 mg/dL (ref >39)
LDL Chol Calc (NIH): 50 mg/dL (ref 0–99)
Triglycerides: 230 mg/dL — ABNORMAL HIGH (ref 0–149)
VLDL Cholesterol Cal: 36 mg/dL (ref 5–40)

## 2021-11-10 LAB — CBC
Hematocrit: 36.8 % (ref 34.0–46.6)
Hemoglobin: 12.1 g/dL (ref 11.1–15.9)
MCH: 28.7 pg (ref 26.6–33.0)
MCHC: 32.9 g/dL (ref 31.5–35.7)
MCV: 87 fL (ref 79–97)
Platelets: 248 10*3/uL (ref 150–450)
RBC: 4.22 x10E6/uL (ref 3.77–5.28)
RDW: 12.9 % (ref 11.7–15.4)
WBC: 5.2 10*3/uL (ref 3.4–10.8)

## 2021-11-10 LAB — HEMOGLOBIN A1C
Est. average glucose Bld gHb Est-mCnc: 128 mg/dL
Hgb A1c MFr Bld: 6.1 % — ABNORMAL HIGH (ref 4.8–5.6)

## 2021-11-10 LAB — MICROALBUMIN / CREATININE URINE RATIO
Creatinine, Urine: 24.3 mg/dL
Microalb/Creat Ratio: <12 mg/g creat (ref 0–29)
Microalbumin, Urine: <3 ug/mL

## 2021-12-24 ENCOUNTER — Other Ambulatory Visit: Payer: Self-pay | Admitting: Internal Medicine

## 2022-01-25 ENCOUNTER — Encounter (HOSPITAL_BASED_OUTPATIENT_CLINIC_OR_DEPARTMENT_OTHER): Payer: Self-pay | Admitting: *Deleted

## 2022-01-25 ENCOUNTER — Other Ambulatory Visit: Payer: Self-pay

## 2022-01-25 ENCOUNTER — Emergency Department (HOSPITAL_BASED_OUTPATIENT_CLINIC_OR_DEPARTMENT_OTHER)
Admission: EM | Admit: 2022-01-25 | Discharge: 2022-01-25 | Disposition: A | Discharge status: Home / Self Care | Payer: Medicare Other | Attending: Emergency Medicine | Admitting: Emergency Medicine

## 2022-01-25 ENCOUNTER — Emergency Department (HOSPITAL_BASED_OUTPATIENT_CLINIC_OR_DEPARTMENT_OTHER): Payer: Medicare Other

## 2022-01-25 DIAGNOSIS — S0990XA Unspecified injury of head, initial encounter: Secondary | ICD-10-CM | POA: Insufficient documentation

## 2022-01-25 DIAGNOSIS — W109XXA Fall (on) (from) unspecified stairs and steps, initial encounter: Secondary | ICD-10-CM | POA: Diagnosis not present

## 2022-01-25 DIAGNOSIS — S199XXA Unspecified injury of neck, initial encounter: Secondary | ICD-10-CM | POA: Diagnosis not present

## 2022-01-25 DIAGNOSIS — E119 Type 2 diabetes mellitus without complications: Secondary | ICD-10-CM | POA: Insufficient documentation

## 2022-01-25 DIAGNOSIS — I10 Essential (primary) hypertension: Secondary | ICD-10-CM | POA: Diagnosis not present

## 2022-01-25 DIAGNOSIS — Z7982 Long term (current) use of aspirin: Secondary | ICD-10-CM | POA: Insufficient documentation

## 2022-01-25 DIAGNOSIS — S0083XA Contusion of other part of head, initial encounter: Secondary | ICD-10-CM | POA: Diagnosis not present

## 2022-01-25 DIAGNOSIS — Z79899 Other long term (current) drug therapy: Secondary | ICD-10-CM | POA: Diagnosis not present

## 2022-01-25 DIAGNOSIS — R519 Headache, unspecified: Secondary | ICD-10-CM | POA: Diagnosis present

## 2022-01-25 NOTE — ED Triage Notes (Signed)
Pt states that she fell down her steps after tripping on Saturday night and struck her head.  Pt has bruise on left side of head.  No LOC, not on blood thinners.  Pt has hematoma to left side of her head.  No neck pain, has HA

## 2022-01-25 NOTE — Discharge Instructions (Addendum)
We evaluated you for your head injury.  Your CT scans of your head and neck were negative.  Please take Tylenol as needed for your pain.  You can take at 1000 mg of Tylenol every 6 hours as needed.  Please be careful when walking in the future.  Given your age, if you fall, you are at higher risk of having bleeding in your head or skull fracture.

## 2022-01-25 NOTE — ED Provider Notes (Signed)
[facility]GENC[facility] DEPART[facility] Provider Note  CSN: [id number] Arrival date & time: 01/25/22 1644  Chief Complaint(s) Fall  HPI Vickie Brown is a 82 y.o. female with history of chronic headache, type 2 diabetes, hypertension, hyperlipidemia presenting to the emergency department with head injury.  She reports that the head injury was 2 days ago.  She initially did not come to the hospital, reports mild headaches afterwards which is typical for her.  No nausea, vomiting, loss of consciousness.  She reports that she fell on her knees as well but has been able to ambulate without difficulty.  She primarily presented because her children and grandchildren have been passed during her even though she feels pretty well.  She reports that the fall was due to a trip while she was ambulating.   Past Medical History Past Medical History:  Diagnosis Date   Chronic headaches    Diverticulitis    Hyperlipidemia    Hypertension    Knee pain    Type 2 diabetes mellitus ([place])    Patient Active Problem List   Diagnosis Date Noted   Fall 11/09/2021   Bilateral primary osteoarthritis of knee 11/09/2021   Pain in both hands 05/04/2021   Atherosclerosis of aorta ([place]) 02/05/2021   Fasciculations 10/19/2020   Class 1 obesity due to excess calories with serious comorbidity and body mass index (BMI) of 30.0 to 30.9 in adult 10/19/2020   Age-related osteoporosis without current pathological fracture 10/14/2020   Vitamin D deficiency 10/14/2020   Generalized osteoarthritis 10/14/2020   Type 2 diabetes mellitus with stage 2 chronic kidney disease, without long-term current use of insulin ([place]) 12/22/2017   Chronic renal disease, stage II 12/22/2017   Hypertensive nephropathy 12/22/2017   Dermatitis 12/22/2017   Dyspnea on exertion 06/22/2010   Myalgia 06/22/2010   Home Medication(s) Prior to Admission medications   Medication Sig Start Date End Date Taking? Authorizing Provider   acetaminophen (TYLENOL) 325 MG tablet Take 650 mg by mouth every 6 (six) hours as needed.    [provider]  alendronate (FOSAMAX) 70 MG tablet TAKE 1 TABLET(70 MG) BY MOUTH 1 TIME A WEEK WITH A FULL GLASS OF WATER AND ON AN EMPTY STOMACH Patient not taking: Reported on 07/30/2021 04/27/21   Collier Salina, MD  ascorbic acid (VITAMIN C) 500 MG tablet Take 500 mg by mouth daily.    [provider]  aspirin 81 MG tablet Take 81 mg by mouth every evening.    [provider]  BIOTIN PO Take by mouth daily.    [provider]  Calcium Carbonate (CALCIUM 500 PO) Take by mouth.    [provider]  cetirizine (ZYRTEC) 10 MG tablet Take 10 mg by mouth at bedtime.    [provider]  Cholecalciferol (VITAMIN D3) 2000 UNITS TABS Take 1 tablet by mouth daily.    [provider]  fluticasone (FLONASE) 50 MCG/ACT nasal spray Place 1 spray into both nostrils at bedtime. 07/08/13   Angelica Ran, MD  Homeopathic Products (OSCILLOCOCCINUM PO) Take by mouth.    [provider]  hydrochlorothiazide (MICROZIDE) 12.5 MG capsule TAKE 1 CAPSULE(12.5 MG) BY MOUTH EVERY MORNING 05/04/21   Glendale Chard, MD  ipratropium (ATROVENT) 0.03 % nasal spray Place 2 sprays into the nose 3 (three) times daily. Patient taking differently: Place 2 sprays into the nose as needed. 03/10/20 05/04/21  Minette Brine, FNP  JANUVIA 100 MG tablet TAKE 1 TABLET(100 MG) BY MOUTH DAILY  12/25/21   Glendale Chard, MD  losartan (COZAAR) 50 MG tablet TAKE 1 TABLET(50 MG) BY MOUTH DAILY 11/09/21   Glendale Chard, MD  Multiple Vitamins-Minerals (ALIVE ONCE DAILY WOMENS 50+ PO) Take 1 tablet by mouth daily.    [provider]  Omega-3 Fatty Acids (FISH OIL PO) Take by mouth.    [provider]  pravastatin (PRAVACHOL) 40 MG tablet TAKE 1 TABLET BY MOUTH DAILY 09/25/21   Glendale Chard, MD  Probiotic Product (ALIGN) 4 MG CAPS Take 4 mg by mouth daily.     [provider]  TURMERIC PO Take by mouth.    [provider]  UNABLE TO FIND Med Name: Unkers therapeutic Rub    [provider]  vitamin B-12 (CYANOCOBALAMIN) 500 MCG tablet Take 500 mcg by mouth daily.    [provider]                                                                                                                                    Past Surgical History Past Surgical History:  Procedure Laterality Date   Bilateral tubal ligation  1971   BTL     PARTIAL HYSTERECTOMY  1973   TONSILECTOMY, ADENOIDECTOMY, BILATERAL MYRINGOTOMY AND TUBES  1950   VESICOVAGINAL FISTULA CLOSURE W/ TAH     Family History Family History  Problem Relation Age of Onset   Cervical cancer Mother    Cancer Mother    Diabetes Father    Diabetes Sister    Diabetes Brother    Other Brother        killed   Breast cancer Neg Hx     Social History Social History   Tobacco Use   Smoking status: Never   Smokeless tobacco: Never  Vaping Use   Vaping Use: Never used  Substance Use Topics   Alcohol use: Yes    Comment: occassionally   Drug use: No   Allergies Patient has no known allergies.  Review of Systems Review of Systems  All other systems reviewed and are negative.   Physical Exam Vital Signs  I have reviewed the triage vital signs BP 101/63 (BP Location: Left Arm)   Pulse 86   Temp 97.8 F (36.6 C)   Resp 20   Ht 5\' 2"  (1.575 m)   Wt 63.5 kg   SpO2 100%   BMI 25.61 kg/m  Physical Exam Vitals and nursing note reviewed.  Constitutional:      General: She is not in acute distress.    Appearance: She is well-developed.  HENT:     Head: Normocephalic and atraumatic.     Comments: Small contusion to the left face and scalp    Mouth/Throat:     Mouth: Mucous membranes are moist.  Eyes:     Pupils: Pupils are equal, round, and reactive to light.  Cardiovascular:     Rate and Rhythm: Normal rate  and regular rhythm.     Heart  sounds: No murmur heard. Pulmonary:     Effort: Pulmonary effort is normal. No respiratory distress.     Breath sounds: Normal breath sounds.  Abdominal:     General: Abdomen is flat.     Palpations: Abdomen is soft.     Tenderness: There is no abdominal tenderness.  Musculoskeletal:        General: No tenderness.     Right lower leg: No edema.     Left lower leg: No edema.     Comments: No midline C, T, L-spine tenderness.  No chest wall tenderness or crepitus.  Full painless range of motion at the bilateral upper extremities including the shoulders, elbows, wrists, hand and fingers, and in the bilateral lower extremities including the hips, knees, ankle, toes.  No focal bony tenderness, injury or deformity.  Skin:    General: Skin is warm and dry.  Neurological:     General: No focal deficit present.     Mental Status: She is alert. Mental status is at baseline.  Psychiatric:        Mood and Affect: Mood normal.        Behavior: Behavior normal.     ED Results and Treatments Labs (all labs ordered are listed, but only abnormal results are displayed) Labs Reviewed - No data to display                                                                                                                        Radiology CT Cervical Spine Wo Contrast  Result Date: 01/25/2022 CLINICAL DATA:  Polytrauma, blunt.  Fall EXAM: CT CERVICAL SPINE WITHOUT CONTRAST TECHNIQUE: Multidetector CT imaging of the cervical spine was performed without intravenous contrast. Multiplanar CT image reconstructions were also generated. RADIATION DOSE REDUCTION: This exam was performed according to the departmental dose-optimization program which includes automated exposure control, adjustment of the mA and/or kV according to patient size and/or use of iterative reconstruction technique. COMPARISON:  None Available. FINDINGS: Alignment: Normal Skull base and vertebrae: No acute fracture. No primary bone lesion or  focal pathologic process. Soft tissues and spinal canal: No prevertebral fluid or swelling. No visible canal hematoma. Disc levels:  Diffuse degenerative disc and facet disease. Upper chest: No acute findings Other: None IMPRESSION: No acute bony abnormality. Electronically Signed   By: Charlett Nose M.D.   On: 01/25/2022 17:54   CT Head Wo Contrast  Result Date: 01/25/2022 CLINICAL DATA:  Polytrauma, blunt.  Fall. EXAM: CT HEAD WITHOUT CONTRAST TECHNIQUE: Contiguous axial images were obtained from the base of the skull through the vertex without intravenous contrast. RADIATION DOSE REDUCTION: This exam was performed according to the departmental dose-optimization program which includes automated exposure control, adjustment of the mA and/or kV according to patient size and/or use of iterative reconstruction technique. COMPARISON:  None Available. FINDINGS: Brain: No acute intracranial abnormality. Specifically, no hemorrhage, hydrocephalus, mass lesion, acute infarction, or significant intracranial  injury. Vascular: No hyperdense vessel or unexpected calcification. Skull: No acute calvarial abnormality. Sinuses/Orbits: No acute findings Other: None IMPRESSION: No acute intracranial abnormality. Electronically Signed   By: Charlett Nose M.D.   On: 01/25/2022 17:52    Pertinent labs & imaging results that were available during my care of the patient were reviewed by me and considered in my medical decision making (see MDM for details).  Medications Ordered in ED Medications - No data to display                                                                                                                                   Procedures Procedures  (including critical care time)  Medical Decision Making / ED Course   MDM:  82 year old female with mild head injury status post trip and fall.  Patient well-appearing, injury occurred 2 days ago.  CT scan of the head and neck negative for acute fracture.   Patient declines pain control in the emergency department.  She also reports she hit her knees, no focal tenderness, able to ambulate, low concern for fracture.  She reports that the injury was not a syncopal event and she tripped.  With imaging negative, will discharge patient home.  Discussed that given her age if she does hit her head with force she should probably come to the emergency department in the future for CT scan to rule out bleeding. Will discharge patient to home. All questions answered. Patient comfortable with plan of discharge. Return precautions discussed with patient and specified on the after visit summary.       Imaging Studies ordered: I ordered imaging studies including CT head and neck On my interpretation imaging demonstrates no acute process I independently visualized and interpreted imaging. I agree with the radiologist interpretation   Medicines ordered and prescription drug management: No orders of the defined types were placed in this encounter.   -I have reviewed the patients home medicines and have made adjustments as needed  Cardiac Monitoring: The patient was maintained on a cardiac monitor.  I personally viewed and interpreted the cardiac monitored which showed an underlying rhythm of: NSR  Social Determinants of Health:  Diagnosis or treatment significantly limited by social determinants of health: lives alone   Reevaluation: After the interventions noted above, I reevaluated the patient and found that they have improved  Co morbidities that complicate the patient evaluation  Past Medical History:  Diagnosis Date   Chronic headaches    Diverticulitis    Hyperlipidemia    Hypertension    Knee pain    Type 2 diabetes mellitus (HCC)       Dispostion: Disposition decision including need for hospitalization was considered, and patient discharged from emergency department.    Final Clinical Impression(s) / ED Diagnoses Final diagnoses:   Minor head injury, initial encounter  Contusion of face, initial encounter     This chart was dictated  using voice recognition software.  Despite best efforts to proofread,  errors can occur which can change the documentation meaning.    Lonell Grandchild, MD 01/25/22 [id number]

## 2022-01-25 NOTE — ED Notes (Signed)
Patient verbalizes understanding of discharge instructions. Opportunity for questioning and answers were provided. Armband removed by staff, pt discharged from ED. Ambulated out to lobby  

## 2022-01-25 NOTE — ED Notes (Signed)
Patient transported to CT 

## 2022-01-25 NOTE — ED Provider Triage Note (Signed)
Emergency Medicine Provider Triage Evaluation Note  Vickie Brown , a 82 y.o. female  was evaluated in triage.  Pt complains of fall and head or jittery.  Patient felt 2 days ago walking up her steps when she tripped.  She hit the left side of her head.  She had a hematoma.  She denies use of blood thinners.  Patient states she came today because "I am sick of my kids and grandkids telling me what to do and I just want to get them to stop."  She denies severe headache, neurologic abnormalities.  She states the top of her head is sore and she is not sure if that is due to roller she had in her hair.  Review of Systems  Positive: Hematoma Negative: loc  Physical Exam  BP 101/63 (BP Location: Left Arm)   Pulse 86   Temp 97.8 F (36.6 C)   Resp 20   Ht 5\' 2"  (1.575 m)   Wt 63.5 kg   SpO2 100%   BMI 25.61 kg/m  Gen:   Awake, no distress   Resp:  Normal effort  MSK:   Moves extremities without difficulty  Other:    Medical Decision Making  Medically screening exam initiated at 4:58 PM.  Appropriate orders placed.  Tim Lair was informed that the remainder of the evaluation will be completed by another provider, this initial triage assessment does not replace that evaluation, and the importance of remaining in the ED until their evaluation is complete.     Margarita Mail, PA-C 01/25/22 1723

## 2022-01-27 ENCOUNTER — Telehealth: Payer: Self-pay

## 2022-01-27 NOTE — Telephone Encounter (Signed)
Transition Care Management Follow-up Telephone Call Date of discharge and from where: 01/25/2022 [facility]  How have you been since you were released from the hospital? Pt states she is not her best, she is getting there.  Any questions or concerns? No  Items Reviewed: Did the pt receive and understand the discharge instructions provided? Yes  Medications obtained and verified? Yes  Other? No  Any new allergies since your discharge? No  Dietary orders reviewed? Yes Do you have support at home? Yes   Home Care and Equipment/Supplies: Were home health services ordered? no If so, what is the name of the agency? N/a  Has the agency set up a time to come to the patient's home? no Were any new equipment or medical supplies ordered?  No What is the name of the medical supply agency? N/a Were you able to get the supplies/equipment? no Do you have any questions related to the use of the equipment or supplies? No  Functional Questionnaire: (I = Independent and D = Dependent) ADLs: i  Bathing/Dressing- i  Meal Prep- i  Eating- i  Maintaining continence- i  Transferring/Ambulation- i  Managing Meds- i  Follow up appointments reviewed:  PCP Hospital f/u appt confirmed? Yes  Scheduled to see robyn sanders on 01/28/2022  @ [facility]. [facility] f/u appt confirmed? No  Scheduled to see n/a on n/a @ n/a. Are transportation arrangements needed? No  If their condition worsens, is the pt aware to call PCP or go to the Emergency Dept.? Yes Was the patient provided with contact information for the PCP's office or ED? Yes Was to pt encouraged to call back with questions or concerns? Yes

## 2022-01-28 ENCOUNTER — Ambulatory Visit (INDEPENDENT_AMBULATORY_CARE_PROVIDER_SITE_OTHER): Payer: Medicare Other | Admitting: Internal Medicine

## 2022-01-28 ENCOUNTER — Encounter: Payer: Self-pay | Admitting: Internal Medicine

## 2022-01-28 VITALS — BP 122/84 | HR 85 | Temp 98.3°F | Wt 146.2 lb

## 2022-01-28 DIAGNOSIS — I1310 Hypertensive heart and chronic kidney disease without heart failure, with stage 1 through stage 4 chronic kidney disease, or unspecified chronic kidney disease: Secondary | ICD-10-CM

## 2022-01-28 DIAGNOSIS — Z79899 Other long term (current) drug therapy: Secondary | ICD-10-CM | POA: Diagnosis not present

## 2022-01-28 DIAGNOSIS — E559 Vitamin D deficiency, unspecified: Secondary | ICD-10-CM | POA: Diagnosis not present

## 2022-01-28 DIAGNOSIS — E1122 Type 2 diabetes mellitus with diabetic chronic kidney disease: Secondary | ICD-10-CM | POA: Diagnosis not present

## 2022-01-28 DIAGNOSIS — W19XXXD Unspecified fall, subsequent encounter: Secondary | ICD-10-CM | POA: Diagnosis not present

## 2022-01-28 DIAGNOSIS — N182 Chronic kidney disease, stage 2 (mild): Secondary | ICD-10-CM | POA: Diagnosis not present

## 2022-01-28 DIAGNOSIS — Z6826 Body mass index (BMI) 26.0-26.9, adult: Secondary | ICD-10-CM

## 2022-01-28 DIAGNOSIS — I129 Hypertensive chronic kidney disease with stage 1 through stage 4 chronic kidney disease, or unspecified chronic kidney disease: Secondary | ICD-10-CM | POA: Diagnosis not present

## 2022-01-28 DIAGNOSIS — I700 Atherosclerosis of aorta: Secondary | ICD-10-CM

## 2022-01-28 DIAGNOSIS — E663 Overweight: Secondary | ICD-10-CM

## 2022-01-28 NOTE — Progress Notes (Signed)
I,Vickie Brown,acting as a scribe for Vickie Greenland, MD.,have documented all relevant documentation on the behalf of Vickie Greenland, MD,as directed by  Vickie Greenland, MD while in the presence of Vickie Greenland, MD.   Subjective:     Patient ID: Vickie Brown , female    DOB: [birth year] , 82 y.o.   MRN: [id number]   Chief Complaint  Patient presents with   Follow-up    HPI  Pt presents today for er f/u. Pt visited ER on 01/25/2022 for fall.  She reports that the head injury was 2 days prior to presentation.  She states she fell when walking in her home. She fell to her knees and hit her head.  She had been having headaches, but this is a chronic issue she has. Denies visual disturbances. She denies passing out, she states she tripped and fell.  She denies  nausea, vomiting, loss of consciousness.  She has not had any issues with ambulation.  She went to ER because her children/grandchildren insisted she go. ER workup included CT cervical spine and CT head - no acute bony abnormalities were noted. She has not had any issues since discharge from ER.       Diabetes She presents for her follow-up diabetic visit. She has type 2 diabetes mellitus. Her disease course has been stable. There are no hypoglycemic associated symptoms. Pertinent negatives for hypoglycemia include no headaches. Pertinent negatives for diabetes include no blurred vision, no chest pain, no polydipsia, no polyphagia and no polyuria. There are no hypoglycemic complications. Risk factors for coronary artery disease include diabetes mellitus, dyslipidemia, hypertension and post-menopausal. She is following a diabetic diet. Her breakfast blood glucose is taken between 8-9 am. Her breakfast blood glucose range is generally 90-110 mg/dl.  Hypertension This is a chronic problem. The current episode started more than 1 year ago. The problem has been gradually improving since onset. Pertinent negatives include no blurred  vision, chest pain, headaches, palpitations or shortness of breath.     Past Medical History:  Diagnosis Date   Chronic headaches    Diverticulitis    Hyperlipidemia    Hypertension    Knee pain    Type 2 diabetes mellitus ([place])      Family History  Problem Relation Age of Onset   Cervical cancer Mother    Cancer Mother    Diabetes Father    Diabetes Sister    Diabetes Brother    Other Brother        killed   Breast cancer Neg Hx      Current Outpatient Medications:    acetaminophen (TYLENOL) 325 MG tablet, Take 650 mg by mouth every 6 (six) hours as needed., Disp: , Rfl:    ascorbic acid (VITAMIN C) 500 MG tablet, Take 500 mg by mouth daily., Disp: , Rfl:    aspirin 81 MG tablet, Take 81 mg by mouth every evening., Disp: , Rfl:    BIOTIN PO, Take by mouth daily., Disp: , Rfl:    Calcium Carbonate (CALCIUM 500 PO), Take by mouth., Disp: , Rfl:    cetirizine (ZYRTEC) 10 MG tablet, Take 10 mg by mouth at bedtime., Disp: , Rfl:    Cholecalciferol (VITAMIN D3) 2000 UNITS TABS, Take 1 tablet by mouth daily., Disp: , Rfl:    fluticasone (FLONASE) 50 MCG/ACT nasal spray, Place 1 spray into both nostrils at bedtime., Disp: 16 g, Rfl: 2   Homeopathic Products (OSCILLOCOCCINUM PO), Take by  mouth., Disp: , Rfl:    hydrochlorothiazide (MICROZIDE) 12.5 MG capsule, TAKE 1 CAPSULE(12.5 MG) BY MOUTH EVERY MORNING, Disp: 90 capsule, Rfl: 1   JANUVIA 100 MG tablet, TAKE 1 TABLET(100 MG) BY MOUTH DAILY, Disp: 90 tablet, Rfl: 1   losartan (COZAAR) 50 MG tablet, TAKE 1 TABLET(50 MG) BY MOUTH DAILY, Disp: 90 tablet, Rfl: 1   Multiple Vitamins-Minerals (ALIVE ONCE DAILY WOMENS 50+ PO), Take 1 tablet by mouth daily., Disp: , Rfl:    Omega-3 Fatty Acids (FISH OIL PO), Take by mouth., Disp: , Rfl:    pravastatin (PRAVACHOL) 40 MG tablet, TAKE 1 TABLET BY MOUTH DAILY, Disp: 90 tablet, Rfl: 1   Probiotic Product (ALIGN) 4 MG CAPS, Take 4 mg by mouth daily., Disp: , Rfl:    TURMERIC PO, Take by mouth.,  Disp: , Rfl:    UNABLE TO FIND, Med Name: Unkers therapeutic Rub, Disp: , Rfl:    vitamin B-12 (CYANOCOBALAMIN) 500 MCG tablet, Take 500 mcg by mouth daily., Disp: , Rfl:    alendronate (FOSAMAX) 70 MG tablet, TAKE 1 TABLET(70 MG) BY MOUTH 1 TIME A WEEK WITH A FULL GLASS OF WATER AND ON AN EMPTY STOMACH (Patient not taking: Reported on 07/30/2021), Disp: 12 tablet, Rfl: 3   ipratropium (ATROVENT) 0.03 % nasal spray, Place 2 sprays into the nose 3 (three) times daily. (Patient taking differently: Place 2 sprays into the nose as needed.), Disp: 30 mL, Rfl: 2   No Known Allergies   Review of Systems  Constitutional: Negative.   Eyes:  Negative for blurred vision.  Respiratory: Negative.  Negative for shortness of breath.   Cardiovascular: Negative.  Negative for chest pain and palpitations.  Endocrine: Negative for polydipsia, polyphagia and polyuria.  Neurological: Negative.  Negative for headaches.  Psychiatric/Behavioral: Negative.       Today's Vitals   01/28/22 1554  BP: 122/84  Pulse: 85  Temp: 98.3 F (36.8 C)  SpO2: 93%  Weight: 146 lb 3.2 oz (66.3 kg)   Body mass index is 26.74 kg/m.  Wt Readings from Last 3 Encounters:  01/28/22 146 lb 3.2 oz (66.3 kg)  01/25/22 140 lb (63.5 kg)  11/09/21 142 lb 12.8 oz (64.8 kg)    Objective:  Physical Exam Vitals and nursing note reviewed.  Constitutional:      Appearance: Normal appearance.  HENT:     Head: Normocephalic and atraumatic.     Comments: Left eye w/ periorbital ecchymoses    Nose:     Comments: Masked     Mouth/Throat:     Comments: Masked  Cardiovascular:     Rate and Rhythm: Normal rate and regular rhythm.     Heart sounds: Normal heart sounds.  Pulmonary:     Effort: Pulmonary effort is normal.     Breath sounds: Normal breath sounds.  Skin:    General: Skin is warm.  Neurological:     General: No focal deficit present.     Mental Status: She is alert.  Psychiatric:        Mood and Affect: Mood  normal.        Behavior: Behavior normal.       Assessment And Plan:     1. Fall, subsequent encounter Comments: Occurred on 01/23/22.  ER records reviewed. She is encouraged to remove all throw rugs in her home.  TCM PERFORMED. A MEMBER OF THE CLINICAL TEAM SPOKE WITH THE PATIENT WITHIN 48 HOURS OF ER DISCHARGE. Er RECORDS REVIEWED IN  FULL DETAIL. MEDS RECONCILED AND COMPARED TO DISCHARGE MEDS. MEDICATION LIST WAS UPDATED AND REVIEWED WITH THE PATIENT. GREATER THAN 50% FACE TO FACE TIME WAS SPENT IN COUNSELING AND COORDINATION OF CARE. ALL QUESTIONS WERE ANSWERED TO THE SATISFACTION OF THE PATIENT.    2. Type 2 diabetes mellitus with stage 2 chronic kidney disease, without long-term current use of insulin (HCC) Comments: Chronic, I will check labs as below. She is encouraged to limit her intake of sugary beverages and foods. F/u in 3-4 months. - CMP14+EGFR - CBC - Hemoglobin A1c - Lipid panel  3. Hypertensive heart and renal disease with renal failure, stage 1 through stage 4 or unspecified chronic kidney disease, without heart failure Comments: Chornic, well controlled. Encouraged to follow low sodium diet.  She will c/w losartan 50mg  and hctz 12.5mg  daily. - CMP14+EGFR  4. Atherosclerosis of aorta (HCC) Comments: Chronic, LDL goal <70. She will c/w pravastatin and ASA daily.  5. Vitamin D insufficiency Comments: I will check a vitamin D level and supplement as needed. - Vitamin D (25 hydroxy)  6. Overweight with body mass index (BMI) of 26 to 26.9 in adult Comments: She is encouraged to aim for at least 150 minutes of exercise per week.  7. Drug therapy   Patient was given opportunity to ask questions. Patient verbalized understanding of the plan and was able to repeat key elements of the plan. All questions were answered to their satisfaction.   I, Vickie Greenland, MD, have reviewed all documentation for this visit. The documentation on 01/28/22 for the exam, diagnosis,  procedures, and orders are all accurate and complete.   IF YOU HAVE BEEN REFERRED TO A SPECIALIST, IT MAY TAKE 1-2 WEEKS TO SCHEDULE/PROCESS THE REFERRAL. IF YOU HAVE NOT HEARD FROM US/SPECIALIST IN TWO WEEKS, PLEASE GIVE [place] A CALL AT [phone].   THE PATIENT IS ENCOURAGED TO PRACTICE SOCIAL DISTANCING DUE TO THE COVID-19 PANDEMIC.

## 2022-01-28 NOTE — Patient Instructions (Signed)

## 2022-01-29 LAB — HEMOGLOBIN A1C
Est. average glucose Bld gHb Est-mCnc: 131 mg/dL
Hgb A1c MFr Bld: 6.2 % — ABNORMAL HIGH (ref 4.8–5.6)

## 2022-01-29 LAB — CBC
Hematocrit: 39.4 % (ref 34.0–46.6)
Hemoglobin: 12.8 g/dL (ref 11.1–15.9)
MCH: 28.2 pg (ref 26.6–33.0)
MCHC: 32.5 g/dL (ref 31.5–35.7)
MCV: 87 fL (ref 79–97)
Platelets: 290 10*3/uL (ref 150–450)
RBC: 4.54 x10E6/uL (ref 3.77–5.28)
RDW: 12.9 % (ref 11.7–15.4)
WBC: 5 10*3/uL (ref 3.4–10.8)

## 2022-01-29 LAB — CMP14+EGFR
ALT: 18 IU/L (ref 0–32)
AST: 24 IU/L (ref 0–40)
Albumin/Globulin Ratio: 1.8 (ref 1.2–2.2)
Albumin: 4.6 g/dL (ref 3.7–4.7)
Alkaline Phosphatase: 56 IU/L (ref 44–121)
BUN/Creatinine Ratio: 8 — ABNORMAL LOW (ref 12–28)
BUN: 8 mg/dL (ref 8–27)
Bilirubin Total: <0.2 mg/dL (ref 0.0–1.2)
CO2: 27 mmol/L (ref 20–29)
Calcium: 10.1 mg/dL (ref 8.7–10.3)
Chloride: 98 mmol/L (ref 96–106)
Creatinine, Ser: 0.99 mg/dL (ref 0.57–1.00)
Globulin, Total: 2.6 g/dL (ref 1.5–4.5)
Glucose: 108 mg/dL — ABNORMAL HIGH (ref 70–99)
Potassium: 4.6 mmol/L (ref 3.5–5.2)
Sodium: 139 mmol/L (ref 134–144)
Total Protein: 7.2 g/dL (ref 6.0–8.5)
eGFR: 57 mL/min/{1.73_m2} — ABNORMAL LOW (ref >59)

## 2022-01-29 LAB — LIPID PANEL
Chol/HDL Ratio: 2.7 ratio (ref 0.0–4.4)
Cholesterol, Total: 165 mg/dL (ref 100–199)
HDL: 61 mg/dL (ref >39)
LDL Chol Calc (NIH): 68 mg/dL (ref 0–99)
Triglycerides: 220 mg/dL — ABNORMAL HIGH (ref 0–149)
VLDL Cholesterol Cal: 36 mg/dL (ref 5–40)

## 2022-01-29 LAB — VITAMIN D 25 HYDROXY (VIT D DEFICIENCY, FRACTURES): Vit D, 25-Hydroxy: 59.9 ng/mL (ref 30.0–100.0)

## 2022-02-08 DIAGNOSIS — E1165 Type 2 diabetes mellitus with hyperglycemia: Secondary | ICD-10-CM | POA: Diagnosis not present

## 2022-02-08 DIAGNOSIS — E1169 Type 2 diabetes mellitus with other specified complication: Secondary | ICD-10-CM | POA: Diagnosis not present

## 2022-02-08 DIAGNOSIS — E119 Type 2 diabetes mellitus without complications: Secondary | ICD-10-CM | POA: Diagnosis not present

## 2022-03-11 ENCOUNTER — Ambulatory Visit: Payer: Medicare Other | Admitting: Internal Medicine

## 2022-03-23 ENCOUNTER — Other Ambulatory Visit: Payer: Self-pay

## 2022-03-24 ENCOUNTER — Other Ambulatory Visit: Payer: Self-pay | Admitting: Internal Medicine

## 2022-04-07 ENCOUNTER — Other Ambulatory Visit: Payer: Self-pay

## 2022-04-07 ENCOUNTER — Emergency Department (HOSPITAL_BASED_OUTPATIENT_CLINIC_OR_DEPARTMENT_OTHER)
Admission: EM | Admit: 2022-04-07 | Discharge: 2022-04-07 | Disposition: A | Discharge status: Home / Self Care | Payer: Medicare Other | Attending: Emergency Medicine | Admitting: Emergency Medicine

## 2022-04-07 ENCOUNTER — Encounter (HOSPITAL_BASED_OUTPATIENT_CLINIC_OR_DEPARTMENT_OTHER): Payer: Self-pay

## 2022-04-07 ENCOUNTER — Emergency Department (HOSPITAL_BASED_OUTPATIENT_CLINIC_OR_DEPARTMENT_OTHER): Payer: Medicare Other

## 2022-04-07 DIAGNOSIS — W010XXA Fall on same level from slipping, tripping and stumbling without subsequent striking against object, initial encounter: Secondary | ICD-10-CM | POA: Diagnosis not present

## 2022-04-07 DIAGNOSIS — S82891A Other fracture of right lower leg, initial encounter for closed fracture: Secondary | ICD-10-CM | POA: Diagnosis not present

## 2022-04-07 DIAGNOSIS — S92151A Displaced avulsion fracture (chip fracture) of right talus, initial encounter for closed fracture: Secondary | ICD-10-CM | POA: Diagnosis not present

## 2022-04-07 DIAGNOSIS — Z7982 Long term (current) use of aspirin: Secondary | ICD-10-CM | POA: Insufficient documentation

## 2022-04-07 DIAGNOSIS — S92031A Displaced avulsion fracture of tuberosity of right calcaneus, initial encounter for closed fracture: Secondary | ICD-10-CM | POA: Insufficient documentation

## 2022-04-07 DIAGNOSIS — S99911A Unspecified injury of right ankle, initial encounter: Secondary | ICD-10-CM | POA: Diagnosis present

## 2022-04-07 DIAGNOSIS — S92154A Nondisplaced avulsion fracture (chip fracture) of right talus, initial encounter for closed fracture: Secondary | ICD-10-CM | POA: Diagnosis not present

## 2022-04-07 DIAGNOSIS — M7989 Other specified soft tissue disorders: Secondary | ICD-10-CM | POA: Diagnosis not present

## 2022-04-07 MED ORDER — TRAMADOL HCL 50 MG PO TABS: 50.0000 mg | ORAL_TABLET | Freq: Four times a day (QID) | ORAL | 0 refills | Status: DC | PRN

## 2022-04-07 MED ORDER — TRAMADOL HCL 50 MG PO TABS
50.0000 mg | ORAL_TABLET | Freq: Once | ORAL | Status: AC
  Administered 2022-04-07: 50 mg via ORAL
  Filled 2022-04-07: qty 1

## 2022-04-07 NOTE — Discharge Instructions (Addendum)
You were seen in the emergency department for a fall. You had considerable pain in the right ankle so xrays of the right foot and ankle were performed which showed a possible avulsion fracture of the right ankle. I would encourage you to follow up with orthopedics in the next few days to ensure that you are healing well. You can manage pain at home with over the counter pain medications as needed such as Tylenol and Ibuprofen. I have also sent in a prescription for Tramadol for more severe pain as needed.

## 2022-04-07 NOTE — ED Provider Notes (Signed)
[facility] Provider Note   CSN: [id number] Arrival date & time: 04/07/22  1648     History Chief Complaint  Patient presents with   Lytle Michaels    Vickie Brown is a 82 y.o. female.  Patient presents emergency department complaints of a fall.  She reports that she fell earlier when she was try to put pants on and tripped over herself.  She feels that she rolled her right ankle and is not having worsening ankle pain.  She denies any head strike during this0 is not currently on any blood thinners but does take aspirin.  Has not taken anything for the pain prior to arriving in the emergency department.  She is able to move her foot and ankle but there is pain in the ankle with movement.   Fall       Home Medications Prior to Admission medications   Medication Sig Start Date End Date Taking? Authorizing Provider  acetaminophen (TYLENOL) 325 MG tablet Take 650 mg by mouth every 6 (six) hours as needed.    [provider]  alendronate (FOSAMAX) 70 MG tablet TAKE 1 TABLET(70 MG) BY MOUTH 1 TIME A WEEK WITH A FULL GLASS OF WATER AND ON AN EMPTY STOMACH Patient not taking: Reported on 07/30/2021 04/27/21   Collier Salina, MD  ascorbic acid (VITAMIN C) 500 MG tablet Take 500 mg by mouth daily.    [provider]  aspirin 81 MG tablet Take 81 mg by mouth every evening.    [provider]  BIOTIN PO Take by mouth daily.    [provider]  Calcium Carbonate (CALCIUM 500 PO) Take by mouth.    [provider]  cetirizine (ZYRTEC) 10 MG tablet Take 10 mg by mouth at bedtime.    [provider]  Cholecalciferol (VITAMIN D3) 2000 UNITS TABS Take 1 tablet by mouth daily.    [provider]  fluticasone (FLONASE) 50 MCG/ACT nasal spray Place 1 spray into both nostrils at bedtime. 07/08/13   Angelica Ran, MD  Homeopathic Products (OSCILLOCOCCINUM PO) Take by mouth.    [provider]  hydrochlorothiazide (MICROZIDE) 12.5 MG capsule TAKE 1 CAPSULE(12.5 MG) BY MOUTH EVERY MORNING 05/04/21   Glendale Chard, MD  ipratropium (ATROVENT) 0.03 % nasal spray Place 2 sprays into the nose 3 (three) times daily. Patient taking differently: Place 2 sprays into the nose as needed. 03/10/20 05/04/21  Minette Brine, FNP  JANUVIA 100 MG tablet TAKE 1 TABLET(100 MG) BY MOUTH DAILY 12/25/21   Glendale Chard, MD  losartan (COZAAR) 50 MG tablet TAKE 1 TABLET(50 MG) BY MOUTH DAILY 11/09/21   Glendale Chard, MD  Multiple Vitamins-Minerals (ALIVE ONCE DAILY WOMENS 50+ PO) Take 1 tablet by mouth daily.    [provider]  Omega-3 Fatty Acids (FISH OIL PO) Take by mouth.    [provider]  pravastatin (PRAVACHOL) 40 MG tablet TAKE 1 TABLET BY MOUTH DAILY 03/24/22   Glendale Chard, MD  Probiotic Product (ALIGN) 4 MG CAPS Take 4 mg by mouth daily.    [provider]  TURMERIC PO Take by mouth.    [provider]  UNABLE TO FIND Med Name: Unkers therapeutic Rub    [provider]  vitamin B-12 (CYANOCOBALAMIN) 500 MCG tablet Take 500 mcg by mouth daily.    [provider]      Allergies    Patient has no known allergies.  Review of Systems   Review of Systems  Musculoskeletal:  Positive for joint swelling.  All other systems reviewed and are negative.   Physical Exam Updated Vital Signs BP (!) 132/58 (BP Location: Left Arm)   Pulse 95   Temp 97.7 F (36.5 C) (Oral)   Resp 18   Ht 5\' 2"  (1.575 m)   Wt 67.1 kg   SpO2 100%   BMI 27.07 kg/m  Physical Exam Vitals and nursing note reviewed.  Constitutional:      General: She is not in acute distress.    Appearance: She is well-developed.  HENT:     Head: Normocephalic and atraumatic.  Eyes:     Conjunctiva/sclera: Conjunctivae normal.  Cardiovascular:     Rate and Rhythm: Normal rate and regular rhythm.     Heart sounds: No murmur heard. Pulmonary:     Effort:  Pulmonary effort is normal. No respiratory distress.     Breath sounds: Normal breath sounds.  Abdominal:     Palpations: Abdomen is soft.     Tenderness: There is no abdominal tenderness.  Musculoskeletal:        General: Swelling, tenderness and signs of injury present.     Cervical back: Neck supple.     Comments: Swelling noted to the right lower ankle with no obvious bruising at this time.  Range of motion is limited due to pain.  Point tenderness noted to the anterior lateral aspect of the ankle.  Skin:    General: Skin is warm and dry.     Capillary Refill: Capillary refill takes less than 2 seconds.  Neurological:     General: No focal deficit present.     Mental Status: She is alert.     Motor: No weakness.  Psychiatric:        Mood and Affect: Mood normal.     ED Results / Procedures / Treatments   Labs (all labs ordered are listed, but only abnormal results are displayed) Labs Reviewed - No data to display  EKG None  Radiology DG Foot Complete Right  Result Date: 04/07/2022 CLINICAL DATA:  Pain EXAM: RIGHT FOOT COMPLETE - 3+ VIEW; RIGHT ANKLE - COMPLETE 3+ VIEW COMPARISON:  None Available. FINDINGS: There is a possible extensor digitorum brevis avulsion fracture at the anterolateral calcaneus. No evidence of additional fracture in the foot. No significant hindfoot arthritis. Plantar calcaneal spur. There is postaxial polydactyly with duplication of the fifth toe distal phalanx. There is mild soft tissue swelling of the ankle and foot. IMPRESSION: Possible extensor digitorum brevis avulsion fracture of the anterolateral calcaneus, correlate with point tenderness. Mild soft tissue swelling of the ankle and foot. Electronically Signed   By: Maurine Simmering M.D.   On: 04/07/2022 17:46    Procedures Procedures   Medications Ordered in ED Medications  traMADol (ULTRAM) tablet 50 mg (50 mg Oral Given 04/07/22 1839)    ED Course/ Medical Decision Making/ A&P                            Medical Decision Making Amount and/or Complexity of Data Reviewed Radiology: ordered.  Risk Prescription drug management.   This patient presents to the ED for concern of fall.  Differential diagnosis includes ankle fracture, ankle dislocation, ankle sprain, head trauma   Imaging Studies ordered:  I ordered imaging studies including x-rays of right ankle and right foot I independently visualized and interpreted imaging which showed suspected  avulsion fracture of the anteriolateral aspect of the calcaneus I agree with the radiologist interpretation   Medicines ordered and prescription drug management:  I ordered medication including tramadol for pain Reevaluation of the patient after these medicines showed that the patient improved I have reviewed the patients home medicines and have made adjustments as needed   Problem List / ED Course:  Patient presents emergency department following mechanical fall.  She reports that she was earlier today she tripped and stumbled and rolled her right ankle.  She reported that since then she is having some notable swelling and pain to the area has not improved for the course the day.  She able to bear weight on the ankle but it is significantly painful and tender.  Patient does take aspirin but not on any other blood thinners.  Presentation was concerning for possible ankle fracture dislocations x-rays were performed of the ankle and foot which did show a possible close avulsion fracture of the right ankle around the anterior lateral aspect of the calcaneus.  Informed patient of these findings advised patient that she should manage the symptoms at home as best she can with over-the-counter pain medication but also sent a prescription for tramadol for additional pain relief as needed.  Also encourage patient to follow-up with orthopedics for further evaluation management the ankle is healing properly.  Patient agreeable with this treatment plan  verbalized understanding all return precautions.  All questions answered prior to patient discharge.  Final Clinical Impression(s) / ED Diagnoses Final diagnoses:  Closed avulsion fracture of right ankle, initial encounter    Rx / DC Orders ED Discharge Orders     None         Luvenia Heller, PA-C 04/07/22 1847    Regan Lemming, MD 04/07/22 2119

## 2022-04-07 NOTE — ED Triage Notes (Signed)
States was putting her pants on this morning and fell. C/o right foot/ankle pain. States swelling and pain when bearing weight.

## 2022-04-14 ENCOUNTER — Emergency Department (HOSPITAL_BASED_OUTPATIENT_CLINIC_OR_DEPARTMENT_OTHER): Payer: Medicare Other

## 2022-04-14 ENCOUNTER — Other Ambulatory Visit: Payer: Self-pay

## 2022-04-14 ENCOUNTER — Emergency Department (HOSPITAL_BASED_OUTPATIENT_CLINIC_OR_DEPARTMENT_OTHER)
Admission: EM | Admit: 2022-04-14 | Discharge: 2022-04-14 | Disposition: A | Discharge status: Home / Self Care | Payer: Medicare Other | Attending: Emergency Medicine | Admitting: Emergency Medicine

## 2022-04-14 DIAGNOSIS — E119 Type 2 diabetes mellitus without complications: Secondary | ICD-10-CM | POA: Insufficient documentation

## 2022-04-14 DIAGNOSIS — M79671 Pain in right foot: Secondary | ICD-10-CM | POA: Diagnosis not present

## 2022-04-14 DIAGNOSIS — R600 Localized edema: Secondary | ICD-10-CM | POA: Diagnosis not present

## 2022-04-14 DIAGNOSIS — I10 Essential (primary) hypertension: Secondary | ICD-10-CM | POA: Insufficient documentation

## 2022-04-14 DIAGNOSIS — M7989 Other specified soft tissue disorders: Secondary | ICD-10-CM | POA: Diagnosis not present

## 2022-04-14 MED ORDER — TRAMADOL HCL 50 MG PO TABS: 50.0000 mg | ORAL_TABLET | Freq: Four times a day (QID) | ORAL | 0 refills | Status: DC | PRN

## 2022-04-14 MED ORDER — TRAMADOL HCL 50 MG PO TABS
50.0000 mg | ORAL_TABLET | Freq: Once | ORAL | Status: AC
  Administered 2022-04-14: 50 mg via ORAL
  Filled 2022-04-14: qty 1

## 2022-04-14 NOTE — ED Notes (Signed)
Transported to xray 

## 2022-04-14 NOTE — ED Provider Notes (Signed)
Emergency Department Provider Note   I have reviewed the triage vital signs and the nursing notes.   HISTORY  Chief Complaint Foot Pain   HPI Vickie Brown is a 82 y.o. female past history of diabetes, hypertension, hyperlipidemia presents to the emergency department with continued right foot pain.  She was seen in the emergency room and on 3/27 after a fall and found to have possible avulsion injury to the calcaneus.  She was placed in a CAM boot and has orthopedic follow-up in 2 days.  She had been taking tramadol as needed for pain but has since run out and is having throbbing pain mainly in the top of the right foot.  No numbness or weakness.  Swelling has reduced. No new injuries.   Past Medical History:  Diagnosis Date   Chronic headaches    Diverticulitis    Hyperlipidemia    Hypertension    Knee pain    Type 2 diabetes mellitus ([place])     Review of Systems  Constitutional: No fever/chills Cardiovascular: Denies chest pain. Respiratory: Denies shortness of breath. Gastrointestinal: No abdominal pain. Musculoskeletal: Negative for back pain. Positive right foot pain.  Skin: Negative for rash. Neurological: Negative for numbness/weakness.   ____________________________________________   PHYSICAL EXAM:  VITAL SIGNS: ED Triage Vitals [04/14/22 1249]  Enc Vitals Group     BP (!) 103/59     Pulse Rate 71     Resp 17     Temp 97.6 F (36.4 C)     Temp Source Oral     SpO2 98 %   Constitutional: Alert and oriented. Well appearing and in no acute distress. Eyes: Conjunctivae are normal.  Head: Atraumatic. Nose: No congestion/rhinnorhea. Mouth/Throat: Mucous membranes are moist.  Neck: No stridor.   Cardiovascular: Good peripheral circulation. 2+ DP pulses.  Respiratory: Normal respiratory effort.   Gastrointestinal: No distention.  Musculoskeletal: Mild edema to the right foot compared to the left.  No skin breakdown or lacerations.  Mild tenderness to  the dorsal aspect of the right foot without deformity.  No specific heel tenderness. No ankle or proximal fibular tenderness.  Neurologic:  Normal speech and language. Skin:  Skin is warm, dry and intact. No rash noted.  ____________________________________________  RADIOLOGY  DG Foot Complete Right  Result Date: 04/14/2022 CLINICAL DATA:  Right foot pain and swelling EXAM: RIGHT FOOT COMPLETE - 3+ VIEW COMPARISON:  04/07/2022 FINDINGS: Frontal, oblique, and lateral views of the right foot are obtained. The small ossific density seen along the distal lateral margin of the calcaneus on prior study appears unchanged, and could reflect sequela of avulsion fracture. There are no other acute bony abnormalities. The bones are osteopenic. Mild osteoarthritis greatest at the first metatarsophalangeal joint. Duplicated fifth middle and distal phalanges again noted. There is diffuse subcutaneous edema, which is more pronounced than prior study. IMPRESSION: 1. Stable small avulsion fracture off of the distal lateral margin of the calcaneus. 2. Progressive diffuse soft tissue edema. 3. Stable osteoarthritis. Electronically Signed   By: Randa Ngo M.D.   On: 04/14/2022 15:25    ____________________________________________   PROCEDURES  Procedure(s) performed:   Procedures  None  ____________________________________________   INITIAL IMPRESSION / ASSESSMENT AND PLAN / ED COURSE  Pertinent labs & imaging results that were available during my care of the patient were reviewed by me and considered in my medical decision making (see chart for details).   This patient is Presenting for Evaluation of foot pain, which  does require a range of treatment options, and is a complaint that involves a moderate risk of morbidity and mortality.  The Differential Diagnoses include fracture, DVT, dislocation, contusion, sprain, etc.  Critical Interventions-    Medications  traMADol (ULTRAM) tablet 50 mg (50  mg Oral Given 04/14/22 1507)    Reassessment after intervention: Pain improved.   Radiologic Tests Ordered, included foot x ray. I independently interpreted the images and agree with radiology interpretation.   Medical Decision Making: Summary:  Patient presents emergency department with pain to the top of the right foot.  No new injuries or falls.  Repeat imaging obtained with no evidence of acute bony abnormality in the patient's area of maximal tenderness.  Plan to refill tramadol prescription.  She can continue with the CAM walker boot and follow with ortho on Friday as scheduled.   Patient's presentation is most consistent with acute, uncomplicated illness.   Disposition: discharge  ____________________________________________  FINAL CLINICAL IMPRESSION(S) / ED DIAGNOSES  Final diagnoses:  Foot pain, right    Note:  This document was prepared using Dragon voice recognition software and may include unintentional dictation errors.  Nanda Quinton, MD, [facility] Emergency Medicine    Ethon Wymer, [facility], MD 04/15/22 1051

## 2022-04-14 NOTE — Discharge Instructions (Signed)
Please keep your follow up appointment with the orthopedic team on Friday. I have called in some additional pain medications to your pharmacy.

## 2022-04-14 NOTE — ED Triage Notes (Signed)
Patient presents to ED via POV from home. Here with right foot pain. Post op boot in place. Patient was seen here a week ago and same and told to follow up with someone else (patient is unable to recall who). Patient reports she has an appointment on Friday but is here due to uncontrolled pain.

## 2022-04-16 DIAGNOSIS — S93491A Sprain of other ligament of right ankle, initial encounter: Secondary | ICD-10-CM | POA: Diagnosis not present

## 2022-04-29 ENCOUNTER — Ambulatory Visit (INDEPENDENT_AMBULATORY_CARE_PROVIDER_SITE_OTHER): Payer: Medicare Other | Admitting: Internal Medicine

## 2022-04-29 ENCOUNTER — Encounter: Payer: Self-pay | Admitting: Internal Medicine

## 2022-04-29 VITALS — BP 118/72 | HR 90 | Temp 98.4°F | Ht 62.0 in | Wt 146.2 lb

## 2022-04-29 DIAGNOSIS — E1122 Type 2 diabetes mellitus with diabetic chronic kidney disease: Secondary | ICD-10-CM

## 2022-04-29 DIAGNOSIS — I1310 Hypertensive heart and chronic kidney disease without heart failure, with stage 1 through stage 4 chronic kidney disease, or unspecified chronic kidney disease: Secondary | ICD-10-CM | POA: Diagnosis not present

## 2022-04-29 DIAGNOSIS — S93401D Sprain of unspecified ligament of right ankle, subsequent encounter: Secondary | ICD-10-CM

## 2022-04-29 DIAGNOSIS — I700 Atherosclerosis of aorta: Secondary | ICD-10-CM

## 2022-04-29 DIAGNOSIS — T148XXA Other injury of unspecified body region, initial encounter: Secondary | ICD-10-CM

## 2022-04-29 DIAGNOSIS — Z6826 Body mass index (BMI) 26.0-26.9, adult: Secondary | ICD-10-CM

## 2022-04-29 DIAGNOSIS — W19XXXD Unspecified fall, subsequent encounter: Secondary | ICD-10-CM | POA: Diagnosis not present

## 2022-04-29 DIAGNOSIS — N182 Chronic kidney disease, stage 2 (mild): Secondary | ICD-10-CM | POA: Diagnosis not present

## 2022-04-29 MED ORDER — TRAMADOL HCL 50 MG PO TABS: 50.0000 mg | ORAL_TABLET | Freq: Two times a day (BID) | ORAL | 0 refills | Status: DC | PRN

## 2022-04-29 NOTE — Progress Notes (Signed)
I,[name],acting as a scribe for Gwynneth Aliment, MD.,have documented all relevant documentation on the behalf of Gwynneth Aliment, MD,as directed by  Gwynneth Aliment, MD while in the presence of Gwynneth Aliment, MD.    Subjective:     Patient ID: Vickie Brown , female    DOB: 1940-07-21 , 82 y.o.   MRN: [id number]   Chief Complaint  Patient presents with   Diabetes   Hypertension    HPI  Patient presents today for diabetes & bp follow up. She reports compliance with meds. She denies chest pain, shortness of breath and palpitations.  Patient reports she had a fall 3 weeks ago. On 3/27, she presented to ER after the fall. She states she was trying to put pants on and tripped over the pants leg. She was trying to do this while standing. She thought that she rolled her right ankle and felt that the pain worsened over time. She did not hit her head on any object nor did she lose consciousness. She did not take anything for the pain prior to arriving in the ED.     ED workup revealed  possible closed avulsion fracture of the right ankle around the anterior lateral aspect of the calcaneus. She was advised to f/u with Ortho, she was referred to [facility]. She was seen there on 4/17 and placed in a boot. She states her ankle feels better while in the boot. There is some pain with ambulation.    Diabetes She presents for her follow-up diabetic visit. She has type 2 diabetes mellitus. Her disease course has been stable. There are no hypoglycemic associated symptoms. Pertinent negatives for hypoglycemia include no headaches. Pertinent negatives for diabetes include no blurred vision, no chest pain, no polydipsia, no polyphagia and no polyuria. There are no hypoglycemic complications. Risk factors for coronary artery disease include diabetes mellitus, dyslipidemia, hypertension and post-menopausal. She is following a diabetic diet. Her breakfast blood glucose is taken between 8-9 am.  Her breakfast blood glucose range is generally 90-110 mg/dl.  Hypertension This is a chronic problem. The current episode started more than 1 year ago. The problem has been gradually improving since onset. Pertinent negatives include no blurred vision, chest pain, headaches, palpitations or shortness of breath.     Past Medical History:  Diagnosis Date   Chronic headaches    Diverticulitis    Hyperlipidemia    Hypertension    Knee pain    Type 2 diabetes mellitus      Family History  Problem Relation Age of Onset   Cervical cancer Mother    Cancer Mother    Diabetes Father    Diabetes Sister    Diabetes Brother    Other Brother        killed   Breast cancer Neg Hx      Current Outpatient Medications:    acetaminophen (TYLENOL) 325 MG tablet, Take 650 mg by mouth every 6 (six) hours as needed., Disp: , Rfl:    ascorbic acid (VITAMIN C) 500 MG tablet, Take 500 mg by mouth daily., Disp: , Rfl:    aspirin 81 MG tablet, Take 81 mg by mouth every evening., Disp: , Rfl:    BIOTIN PO, Take by mouth daily., Disp: , Rfl:    Calcium Carbonate (CALCIUM 500 PO), Take by mouth., Disp: , Rfl:    cetirizine (ZYRTEC) 10 MG tablet, Take 10 mg by mouth at bedtime., Disp: , Rfl:  Cholecalciferol (VITAMIN D3) 2000 UNITS TABS, Take 1 tablet by mouth daily., Disp: , Rfl:    fluticasone (FLONASE) 50 MCG/ACT nasal spray, Place 1 spray into both nostrils at bedtime., Disp: 16 g, Rfl: 2   Homeopathic Products (OSCILLOCOCCINUM PO), Take by mouth., Disp: , Rfl:    hydrochlorothiazide (MICROZIDE) 12.5 MG capsule, TAKE 1 CAPSULE(12.5 MG) BY MOUTH EVERY MORNING, Disp: 90 capsule, Rfl: 1   JANUVIA 100 MG tablet, TAKE 1 TABLET(100 MG) BY MOUTH DAILY, Disp: 90 tablet, Rfl: 1   losartan (COZAAR) 50 MG tablet, TAKE 1 TABLET(50 MG) BY MOUTH DAILY, Disp: 90 tablet, Rfl: 1   Multiple Vitamins-Minerals (ALIVE ONCE DAILY WOMENS 50+ PO), Take 1 tablet by mouth daily., Disp: , Rfl:    Omega-3 Fatty Acids (FISH OIL  PO), Take by mouth., Disp: , Rfl:    pravastatin (PRAVACHOL) 40 MG tablet, TAKE 1 TABLET BY MOUTH DAILY, Disp: 90 tablet, Rfl: 1   Probiotic Product (ALIGN) 4 MG CAPS, Take 4 mg by mouth daily., Disp: , Rfl:    TURMERIC PO, Take by mouth., Disp: , Rfl:    UNABLE TO FIND, Med Name: Unkers therapeutic Rub, Disp: , Rfl:    vitamin B-12 (CYANOCOBALAMIN) 500 MCG tablet, Take 500 mcg by mouth daily., Disp: , Rfl:    alendronate (FOSAMAX) 70 MG tablet, TAKE 1 TABLET(70 MG) BY MOUTH 1 TIME A WEEK WITH A FULL GLASS OF WATER AND ON AN EMPTY STOMACH (Patient not taking: Reported on 07/30/2021), Disp: 12 tablet, Rfl: 3   ipratropium (ATROVENT) 0.03 % nasal spray, Place 2 sprays into the nose 3 (three) times daily. (Patient taking differently: Place 2 sprays into the nose as needed.), Disp: 30 mL, Rfl: 2   traMADol (ULTRAM) 50 MG tablet, Take 1 tablet (50 mg total) by mouth every 12 (twelve) hours as needed for severe pain., Disp: 30 tablet, Rfl: 0   No Known Allergies   Review of Systems  Constitutional: Negative.   Eyes:  Negative for blurred vision.  Respiratory: Negative.  Negative for shortness of breath.   Cardiovascular: Negative.  Negative for chest pain and palpitations.  Gastrointestinal: Negative.   Endocrine: Negative for polydipsia, polyphagia and polyuria.  Musculoskeletal:  Positive for arthralgias.  Skin: Negative.   Neurological: Negative.  Negative for headaches.  Psychiatric/Behavioral: Negative.       Today's Vitals   04/29/22 1418  BP: 118/72  Pulse: 90  Temp: 98.4 F (36.9 C)  SpO2: 98%  Weight: 146 lb 3.2 oz (66.3 kg)  Height: 5\' 2"  (1.575 m)  PainSc: 6   PainLoc: Foot   Body mass index is 26.74 kg/m.  Wt Readings from Last 3 Encounters:  04/29/22 146 lb 3.2 oz (66.3 kg)  04/07/22 148 lb (67.1 kg)  01/28/22 146 lb 3.2 oz (66.3 kg)    Objective:  Physical Exam Vitals and nursing note reviewed.  Constitutional:      Appearance: Normal appearance.  HENT:      Head: Normocephalic and atraumatic.  Eyes:     Extraocular Movements: Extraocular movements intact.  Cardiovascular:     Rate and Rhythm: Normal rate and regular rhythm.     Heart sounds: Normal heart sounds.  Pulmonary:     Effort: Pulmonary effort is normal.     Breath sounds: Normal breath sounds.  Musculoskeletal:     Cervical back: Normal range of motion.     Comments: Boot on RLE  Skin:    General: Skin is warm.  Neurological:  General: No focal deficit present.     Mental Status: She is alert.  Psychiatric:        Mood and Affect: Mood normal.        Behavior: Behavior normal.         Assessment And Plan:     1. Type 2 diabetes mellitus with stage 2 chronic kidney disease, without long-term current use of insulin Comments: Chronic, I will check labs as below. She has been able to control DM w/ o meds. She is aware will have to start meds if a1c > 7%. F/u 4 months. - CMP14+EGFR - Hemoglobin A1c  2. Hypertensive heart and renal disease with renal failure, stage 1 through stage 4 or unspecified chronic kidney disease, without heart failure Comments: Chronic, well controlled. She will c/w losartan  daily. She is encouraged to follow low sodium diet.  3. Atherosclerosis of aorta Comments: Chronic, LDL goal < 70.  She will c/w ASA  and pravastatin daily.  4. Avulsion fracture Comments: ED records reviewed in detail. She is  to wear boot as instructed by Ortho. She does not have any unaddressed pain needs at this time. - DG Bone Density; Future  5. Fall, subsequent encounter Comments: Occurred on 3/27 while putting on pants. Advised to stay seated while pulling on pants and skirts.  6. Body mass index (BMI) of 26.0-26.9 in adult Comments: Her BMI is acceptable for her demographic.    Patient was given opportunity to ask questions. Patient verbalized understanding of the plan and was able to repeat key elements of the plan. All questions were answered to  their satisfaction.   I, Gwynneth Aliment, MD, have reviewed all documentation for this visit. The documentation on 04/29/22 for the exam, diagnosis, procedures, and orders are all accurate and complete.   IF YOU HAVE BEEN REFERRED TO A SPECIALIST, IT MAY TAKE 1-2 WEEKS TO SCHEDULE/PROCESS THE REFERRAL. IF YOU HAVE NOT HEARD FROM US/SPECIALIST IN TWO WEEKS, PLEASE GIVE [place] A CALL AT [phone].   THE PATIENT IS ENCOURAGED TO PRACTICE SOCIAL DISTANCING DUE TO THE COVID-19 PANDEMIC.

## 2022-04-29 NOTE — Patient Instructions (Signed)

## 2022-04-30 LAB — CMP14+EGFR
ALT: 14 IU/L (ref 0–32)
AST: 23 IU/L (ref 0–40)
Albumin/Globulin Ratio: 1.8 (ref 1.2–2.2)
Albumin: 4.2 g/dL (ref 3.7–4.7)
Alkaline Phosphatase: 60 IU/L (ref 44–121)
BUN/Creatinine Ratio: 7 — ABNORMAL LOW (ref 12–28)
BUN: 7 mg/dL — ABNORMAL LOW (ref 8–27)
Bilirubin Total: 0.2 mg/dL (ref 0.0–1.2)
CO2: 25 mmol/L (ref 20–29)
Calcium: 9.5 mg/dL (ref 8.7–10.3)
Chloride: 101 mmol/L (ref 96–106)
Creatinine, Ser: 1 mg/dL (ref 0.57–1.00)
Globulin, Total: 2.4 g/dL (ref 1.5–4.5)
Glucose: 107 mg/dL — ABNORMAL HIGH (ref 70–99)
Potassium: 4.5 mmol/L (ref 3.5–5.2)
Sodium: 141 mmol/L (ref 134–144)
Total Protein: 6.6 g/dL (ref 6.0–8.5)
eGFR: 56 mL/min/{1.73_m2} — ABNORMAL LOW (ref >59)

## 2022-04-30 LAB — HEMOGLOBIN A1C
Est. average glucose Bld gHb Est-mCnc: 131 mg/dL
Hgb A1c MFr Bld: 6.2 % — ABNORMAL HIGH (ref 4.8–5.6)

## 2022-05-03 DIAGNOSIS — S93491D Sprain of other ligament of right ankle, subsequent encounter: Secondary | ICD-10-CM | POA: Diagnosis not present

## 2022-05-10 ENCOUNTER — Other Ambulatory Visit: Payer: Self-pay | Admitting: Internal Medicine

## 2022-05-10 DIAGNOSIS — I129 Hypertensive chronic kidney disease with stage 1 through stage 4 chronic kidney disease, or unspecified chronic kidney disease: Secondary | ICD-10-CM

## 2022-05-31 DIAGNOSIS — S93491D Sprain of other ligament of right ankle, subsequent encounter: Secondary | ICD-10-CM | POA: Diagnosis not present

## 2022-06-24 DIAGNOSIS — E119 Type 2 diabetes mellitus without complications: Secondary | ICD-10-CM | POA: Diagnosis not present

## 2022-06-24 DIAGNOSIS — E1169 Type 2 diabetes mellitus with other specified complication: Secondary | ICD-10-CM | POA: Diagnosis not present

## 2022-06-24 DIAGNOSIS — E1165 Type 2 diabetes mellitus with hyperglycemia: Secondary | ICD-10-CM | POA: Diagnosis not present

## 2022-07-15 ENCOUNTER — Other Ambulatory Visit: Payer: Self-pay

## 2022-07-15 ENCOUNTER — Emergency Department (HOSPITAL_BASED_OUTPATIENT_CLINIC_OR_DEPARTMENT_OTHER)
Admission: EM | Admit: 2022-07-15 | Discharge: 2022-07-15 | Disposition: A | Discharge status: Home / Self Care | Payer: Medicare Other | Attending: Emergency Medicine | Admitting: Emergency Medicine

## 2022-07-15 ENCOUNTER — Encounter (HOSPITAL_BASED_OUTPATIENT_CLINIC_OR_DEPARTMENT_OTHER): Payer: Self-pay | Admitting: *Deleted

## 2022-07-15 DIAGNOSIS — L03113 Cellulitis of right upper limb: Secondary | ICD-10-CM | POA: Diagnosis not present

## 2022-07-15 DIAGNOSIS — W57XXXA Bitten or stung by nonvenomous insect and other nonvenomous arthropods, initial encounter: Secondary | ICD-10-CM | POA: Insufficient documentation

## 2022-07-15 DIAGNOSIS — L03119 Cellulitis of unspecified part of limb: Secondary | ICD-10-CM

## 2022-07-15 DIAGNOSIS — Z7982 Long term (current) use of aspirin: Secondary | ICD-10-CM | POA: Insufficient documentation

## 2022-07-15 DIAGNOSIS — S60561A Insect bite (nonvenomous) of right hand, initial encounter: Secondary | ICD-10-CM | POA: Diagnosis not present

## 2022-07-15 DIAGNOSIS — E119 Type 2 diabetes mellitus without complications: Secondary | ICD-10-CM | POA: Insufficient documentation

## 2022-07-15 DIAGNOSIS — Z79899 Other long term (current) drug therapy: Secondary | ICD-10-CM | POA: Diagnosis not present

## 2022-07-15 DIAGNOSIS — I10 Essential (primary) hypertension: Secondary | ICD-10-CM | POA: Insufficient documentation

## 2022-07-15 DIAGNOSIS — Z7984 Long term (current) use of oral hypoglycemic drugs: Secondary | ICD-10-CM | POA: Diagnosis not present

## 2022-07-15 DIAGNOSIS — S61451A Open bite of right hand, initial encounter: Secondary | ICD-10-CM | POA: Diagnosis not present

## 2022-07-15 MED ORDER — CEPHALEXIN 500 MG PO CAPS: 500.0000 mg | ORAL_CAPSULE | Freq: Four times a day (QID) | ORAL | 0 refills | Status: DC

## 2022-07-15 MED ORDER — IBUPROFEN 400 MG PO TABS
600.0000 mg | ORAL_TABLET | Freq: Once | ORAL | Status: AC
  Administered 2022-07-15: 600 mg via ORAL
  Filled 2022-07-15: qty 1

## 2022-07-15 MED ORDER — IBUPROFEN 600 MG PO TABS: 600.0000 mg | ORAL_TABLET | Freq: Four times a day (QID) | ORAL | 0 refills | Status: DC | PRN

## 2022-07-15 NOTE — Discharge Instructions (Addendum)
Please take your antibiotics as prescribed. Take tylenol/ibuprofen for pain. Continue to use hydrocortisone cream for itching. I recommend close follow-up with PCP for reevaluation.  Please do not hesitate to return to emergency department if worrisome signs symptoms we discussed become apparent.

## 2022-07-15 NOTE — ED Provider Notes (Signed)
EMERGENCY DEPARTMENT AT [facility] Provider Note   CSN: [id number] Arrival date & time: 07/15/22  1336     History  Chief Complaint  Patient presents with   Insect Bite    Vickie Brown is a 82 y.o. female with a past medical history of hyperlipidemia, hypertension, type 2 diabetes presents today for evaluation of insect bite.  Patient states Vickie Brown was in the garden yesterday when Vickie Brown was bitten by an insect x 2 on the right hand.  Patient states that the swelling quickly spread over her hand.  States her hand is painful to touch.  Vickie Brown denies any fever, nausea, vomiting.  Denies any pus or fluid drainage from the bite wound. Denies any loss of sensation to her hand.  HPI    Past Medical History:  Diagnosis Date   Chronic headaches    Diverticulitis    Hyperlipidemia    Hypertension    Knee pain    Type 2 diabetes mellitus ([facility])    Past Surgical History:  Procedure Laterality Date   Bilateral tubal ligation  1971   BTL     PARTIAL HYSTERECTOMY  1973   TONSILECTOMY, ADENOIDECTOMY, BILATERAL MYRINGOTOMY AND TUBES  1950   VESICOVAGINAL FISTULA CLOSURE W/ TAH       Home Medications Prior to Admission medications   Medication Sig Start Date End Date Taking? Authorizing Provider  ascorbic acid (VITAMIN C) 500 MG tablet Take 500 mg by mouth daily.   Yes [provider]  aspirin 81 MG tablet Take 81 mg by mouth every evening.   Yes [provider]  BIOTIN PO Take by mouth daily.   Yes [provider]  Calcium Carbonate (CALCIUM 500 PO) Take by mouth.   Yes [provider]  Cholecalciferol (VITAMIN D3) 2000 UNITS TABS Take 1 tablet by mouth daily.   Yes [provider]  hydrochlorothiazide (MICROZIDE) 12.5 MG capsule TAKE 1 CAPSULE(12.5 MG) BY MOUTH EVERY MORNING 05/04/21  Yes Dorothyann Peng, MD  JANUVIA 100 MG tablet TAKE 1 TABLET(100 MG) BY MOUTH DAILY 12/25/21  Yes Dorothyann Peng, MD  losartan (COZAAR) 50 MG  tablet TAKE 1 TABLET(50 MG) BY MOUTH DAILY 05/10/22  Yes Dorothyann Peng, MD  Multiple Vitamins-Minerals (ALIVE ONCE DAILY WOMENS 50+ PO) Take 1 tablet by mouth daily.   Yes [provider]  pravastatin (PRAVACHOL) 40 MG tablet TAKE 1 TABLET BY MOUTH DAILY 03/24/22  Yes Dorothyann Peng, MD  Probiotic Product (ALIGN) 4 MG CAPS Take 4 mg by mouth daily.   Yes [provider]  TURMERIC PO Take by mouth.   Yes [provider]  vitamin B-12 (CYANOCOBALAMIN) 500 MCG tablet Take 500 mcg by mouth daily.   Yes [provider]  acetaminophen (TYLENOL) 325 MG tablet Take 650 mg by mouth every 6 (six) hours as needed.    [provider]  alendronate (FOSAMAX) 70 MG tablet TAKE 1 TABLET(70 MG) BY MOUTH 1 TIME A WEEK WITH A FULL GLASS OF WATER AND ON AN EMPTY STOMACH Patient not taking: Reported on 07/30/2021 04/27/21   Fuller Plan, MD  cetirizine (ZYRTEC) 10 MG tablet Take 10 mg by mouth at bedtime.    [provider]  fluticasone (FLONASE) 50 MCG/ACT nasal spray Place 1 spray into both nostrils at bedtime. 07/08/13   Garnetta Buddy, MD  Homeopathic Products (OSCILLOCOCCINUM PO) Take by mouth.    [provider]  ipratropium (ATROVENT) 0.03 % nasal spray Place 2  sprays into the nose 3 (three) times daily. Patient taking differently: Place 2 sprays into the nose as needed. 03/10/20 05/04/21  Arnette Felts, FNP  Omega-3 Fatty Acids (FISH OIL PO) Take by mouth.    [provider]  traMADol (ULTRAM) 50 MG tablet Take 1 tablet (50 mg total) by mouth every 12 (twelve) hours as needed for severe pain. 04/29/22   Dorothyann Peng, MD  UNABLE TO FIND Med Name: Daisy Blossom therapeutic Rub    [provider]      Allergies    Patient has no known allergies.    Review of Systems   Review of Systems Negative except as per HPI.  Physical Exam Updated Vital Signs BP (!) 124/58 (BP Location: Left Arm)   Pulse 94   Temp 98.4 F (36.9 C)  (Oral)   Resp 20   Ht 5\' 3"  (1.6 m)   Wt 63.5 kg   SpO2 96%   BMI 24.80 kg/m  Physical Exam Vitals and nursing note reviewed.  Constitutional:      Appearance: Normal appearance.  HENT:     Head: Normocephalic and atraumatic.     Mouth/Throat:     Mouth: Mucous membranes are moist.  Eyes:     General: No scleral icterus. Cardiovascular:     Rate and Rhythm: Normal rate and regular rhythm.     Pulses: Normal pulses.     Heart sounds: Normal heart sounds.  Pulmonary:     Effort: Pulmonary effort is normal.     Breath sounds: Normal breath sounds.  Abdominal:     General: Abdomen is flat.     Palpations: Abdomen is soft.     Tenderness: There is no abdominal tenderness.  Musculoskeletal:        General: No deformity.  Skin:    General: Skin is warm.     Findings: No rash.     Comments: Erythema and swelling to R hand as below with tenderness to palpation. Neurovascular function intact. Normal capillary refill.  Neurological:     General: No focal deficit present.     Mental Status: Vickie Brown is alert.  Psychiatric:        Mood and Affect: Mood normal.     ED Results / Procedures / Treatments   Labs (all labs ordered are listed, but only abnormal results are displayed) Labs Reviewed - No data to display  EKG None  Radiology No results found.  Procedures Procedures    Medications Ordered in ED Medications - No data to display  ED Course/ Medical Decision Making/ A&P                             Medical Decision Making  This patient presents to the ED for insect bite, this involves an extensive number of treatment options, and is a complaint that carries with a high risk of complications and morbidity.  The differential diagnosis includes cellulitis.  This is not an exhaustive list.  Problem list/ ED course/ Critical interventions/ Medical management: HPI: See above Vital signs within normal range and stable throughout visit. Laboratory/imaging studies  significant for: See above. On physical examination, patient is afebrile and appears in no acute distress. This patient presents with initial presentation of local erythema, warmth, swelling concerning for cellulitis. Sensitivity/pain to light touch around the erythematous area. No lymphangitic spread visible and no fluid pockets or fluctuance concerning for abscess noted. Low concern for osteomyelitis. No immune  compromise, bullae, pain out of proportion, or rapid progression concerning for necrotizing fasciitis. Patient to be discharged home with keflex with follow up with their PCP. I have reviewed the patient home medicines and have made adjustments as needed.  Cardiac monitoring/EKG: The patient was maintained on a cardiac monitor.  I personally reviewed and interpreted the cardiac monitor which showed an underlying rhythm of: sinus rhythm.  Additional history obtained: External records from outside source obtained and reviewed including: Chart review including previous notes, labs, imaging.  Consultations obtained:  Disposition Continued outpatient therapy. Follow-up with PCP recommended for reevaluation of symptoms. Treatment plan discussed with patient.  Pt acknowledged understanding was agreeable to the plan. Worrisome signs and symptoms were discussed with patient, and patient acknowledged understanding to return to the ED if they noticed these signs and symptoms. Patient was stable upon discharge.   This chart was dictated using voice recognition software.  Despite best efforts to proofread,  errors can occur which can change the documentation meaning.          Final Clinical Impression(s) / ED Diagnoses Final diagnoses:  Insect bite of right hand, initial encounter  Cellulitis of hand    Rx / DC Orders ED Discharge Orders          Ordered    cephALEXin (KEFLEX) 500 MG capsule  4 times daily,   Status:  Discontinued        07/15/22 1423    ibuprofen (ADVIL) 600 MG  tablet  Every 6 hours PRN,   Status:  Discontinued        07/15/22 1423    cephALEXin (KEFLEX) 500 MG capsule  4 times daily        07/15/22 1423    ibuprofen (ADVIL) 600 MG tablet  Every 6 hours PRN        07/15/22 1423              Jeanelle Malling, [place] 07/15/22 1435    Alvira Monday, MD 07/16/22 [phone]

## 2022-07-15 NOTE — ED Triage Notes (Signed)
Pt. Was outside watering plants and was bitten on the R hand x 2 by a flying insect.  Pt. Has noted edema in the R hand with pink color tinged skin.  Skin on the R hand is warm to touch.

## 2022-07-23 ENCOUNTER — Encounter: Payer: Self-pay | Admitting: Internal Medicine

## 2022-07-23 ENCOUNTER — Ambulatory Visit (INDEPENDENT_AMBULATORY_CARE_PROVIDER_SITE_OTHER): Payer: Medicare Other | Admitting: Internal Medicine

## 2022-07-23 VITALS — BP 110/70 | HR 84 | Temp 98.8°F | Ht 63.0 in | Wt 145.2 lb

## 2022-07-23 DIAGNOSIS — I129 Hypertensive chronic kidney disease with stage 1 through stage 4 chronic kidney disease, or unspecified chronic kidney disease: Secondary | ICD-10-CM

## 2022-07-23 DIAGNOSIS — Z79899 Other long term (current) drug therapy: Secondary | ICD-10-CM | POA: Diagnosis not present

## 2022-07-23 DIAGNOSIS — F5104 Psychophysiologic insomnia: Secondary | ICD-10-CM

## 2022-07-23 DIAGNOSIS — R5383 Other fatigue: Secondary | ICD-10-CM | POA: Diagnosis not present

## 2022-07-23 DIAGNOSIS — I700 Atherosclerosis of aorta: Secondary | ICD-10-CM

## 2022-07-23 DIAGNOSIS — Z7984 Long term (current) use of oral hypoglycemic drugs: Secondary | ICD-10-CM

## 2022-07-23 DIAGNOSIS — N182 Chronic kidney disease, stage 2 (mild): Secondary | ICD-10-CM

## 2022-07-23 DIAGNOSIS — I1310 Hypertensive heart and chronic kidney disease without heart failure, with stage 1 through stage 4 chronic kidney disease, or unspecified chronic kidney disease: Secondary | ICD-10-CM | POA: Diagnosis not present

## 2022-07-23 DIAGNOSIS — E1122 Type 2 diabetes mellitus with diabetic chronic kidney disease: Secondary | ICD-10-CM | POA: Diagnosis not present

## 2022-07-23 LAB — POCT URINALYSIS DIPSTICK
Bilirubin, UA: NEGATIVE
Blood, UA: NEGATIVE
Glucose, UA: NEGATIVE
Ketones, UA: NEGATIVE
Leukocytes, UA: NEGATIVE
Nitrite, UA: NEGATIVE
Protein, UA: NEGATIVE
Spec Grav, UA: 1.02 (ref 1.010–1.025)
Urobilinogen, UA: 0.2 E.U./dL
pH, UA: 6 (ref 5.0–8.0)

## 2022-07-23 MED ORDER — LOSARTAN POTASSIUM 50 MG PO TABS
ORAL_TABLET | ORAL | 1 refills | Status: DC
Start: 2022-07-23 — End: 2023-05-09

## 2022-07-23 MED ORDER — PRAVASTATIN SODIUM 40 MG PO TABS: 40.0000 mg | ORAL_TABLET | Freq: Every day | ORAL | 1 refills | Status: DC

## 2022-07-23 MED ORDER — CYANOCOBALAMIN 1000 MCG/ML IJ SOLN
1000.0000 ug | Freq: Once | INTRAMUSCULAR | Status: AC
Start: 2022-07-23 — End: 2022-07-23
  Administered 2022-07-23: 1000 ug via INTRAMUSCULAR
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
## 2022-07-23 NOTE — Patient Instructions (Signed)

## 2022-07-24 LAB — MICROALBUMIN / CREATININE URINE RATIO
Creatinine, Urine: 47.5 mg/dL
Microalb/Creat Ratio: 7 mg/g creat (ref 0–29)
Microalbumin, Urine: 3.2 ug/mL

## 2022-07-24 LAB — BMP8+EGFR
BUN/Creatinine Ratio: 6 — ABNORMAL LOW (ref 12–28)
BUN: 6 mg/dL — ABNORMAL LOW (ref 8–27)
CO2: 27 mmol/L (ref 20–29)
Calcium: 9.8 mg/dL (ref 8.7–10.3)
Chloride: 98 mmol/L (ref 96–106)
Creatinine, Ser: 0.98 mg/dL (ref 0.57–1.00)
Glucose: 86 mg/dL (ref 70–99)
Potassium: 4.8 mmol/L (ref 3.5–5.2)
Sodium: 137 mmol/L (ref 134–144)
eGFR: 57 mL/min/{1.73_m2} — ABNORMAL LOW (ref >59)

## 2022-07-24 LAB — HEMOGLOBIN A1C
Est. average glucose Bld gHb Est-mCnc: 140 mg/dL
Hgb A1c MFr Bld: 6.5 % — ABNORMAL HIGH (ref 4.8–5.6)

## 2022-07-24 LAB — VITAMIN B12: Vitamin B-12: >2000 pg/mL — ABNORMAL HIGH (ref 232–1245)

## 2022-07-24 LAB — TSH: TSH: 1.98 u[IU]/mL (ref 0.450–4.500)

## 2022-08-02 DIAGNOSIS — F5104 Psychophysiologic insomnia: Secondary | ICD-10-CM | POA: Insufficient documentation

## 2022-08-02 DIAGNOSIS — R5383 Other fatigue: Secondary | ICD-10-CM | POA: Insufficient documentation

## 2022-08-05 ENCOUNTER — Ambulatory Visit: Payer: Medicare Other | Admitting: Internal Medicine

## 2022-08-05 ENCOUNTER — Ambulatory Visit (INDEPENDENT_AMBULATORY_CARE_PROVIDER_SITE_OTHER): Payer: Medicare Other

## 2022-08-05 VITALS — Ht 63.0 in | Wt 145.0 lb

## 2022-08-05 DIAGNOSIS — Z0000 Encounter for general adult medical examination without abnormal findings: Secondary | ICD-10-CM

## 2022-08-05 NOTE — Progress Notes (Signed)
Subjective:   Vickie Brown is a 82 y.o. female who presents for Medicare Annual (Subsequent) preventive examination.  Visit Complete: Virtual  I connected with  Vickie Brown on 08/05/22 by a audio enabled telemedicine application and verified that I am speaking with the correct person using two identifiers.  Patient Location: Home  Provider Location: Office/Clinic  I discussed the limitations of evaluation and management by telemedicine. The patient expressed understanding and agreed to proceed.  Per patient no change in vitals since last visit; unable to obtain new vitals due to this being a telehealth visit.  Patient was unable to self-report vital signs via telehealth due to a lack of equipment at home.  Review of Systems     Cardiac Risk Factors include: advanced age (>20men, >15 women);diabetes mellitus;hypertension     Objective:    Today's Vitals   08/05/22 1136  Weight: 145 lb (65.8 kg)  Height: 5\' 3"  (1.6 m)   Body mass index is 25.69 kg/m.     08/05/2022   11:45 AM 07/15/2022    1:58 PM 04/07/2022    4:57 PM 01/25/2022    4:55 PM 07/30/2021    9:25 AM 07/28/2021    9:50 AM 06/25/2020   10:25 AM  Advanced Directives  Does Patient Have a Medical Advance Directive? No No No No No No No  Would patient like information on creating a medical advance directive?   No - Patient declined  No - Patient declined No - Patient declined No - Patient declined    Current Medications (verified) Outpatient Encounter Medications as of 08/05/2022  Medication Sig   acetaminophen (TYLENOL) 325 MG tablet Take 650 mg by mouth every 6 (six) hours as needed.   ascorbic acid (VITAMIN C) 500 MG tablet Take 500 mg by mouth daily.   aspirin 81 MG tablet Take 81 mg by mouth every evening.   BIOTIN PO Take by mouth daily.   Calcium Carbonate (CALCIUM 500 PO) Take by mouth.   Cholecalciferol (VITAMIN D3) 2000 UNITS TABS Take 1 tablet by mouth daily.   fluticasone (FLONASE) 50 MCG/ACT  nasal spray Place 1 spray into both nostrils at bedtime.   Homeopathic Products (OSCILLOCOCCINUM PO) Take by mouth.   ibuprofen (ADVIL) 600 MG tablet Take 1 tablet (600 mg total) by mouth every 6 (six) hours as needed.   JANUVIA 100 MG tablet TAKE 1 TABLET(100 MG) BY MOUTH DAILY   losartan (COZAAR) 50 MG tablet TAKE 1 TABLET(50 MG) BY MOUTH DAILY   Multiple Vitamins-Minerals (ALIVE ONCE DAILY WOMENS 50+ PO) Take 1 tablet by mouth daily.   pravastatin (PRAVACHOL) 40 MG tablet Take 1 tablet (40 mg total) by mouth daily.   Probiotic Product (ALIGN) 4 MG CAPS Take 4 mg by mouth daily.   traMADol (ULTRAM) 50 MG tablet Take 1 tablet (50 mg total) by mouth every 12 (twelve) hours as needed for severe pain.   TURMERIC PO Take by mouth.   UNABLE TO FIND Med Name: Unkers therapeutic Rub   vitamin B-12 (CYANOCOBALAMIN) 500 MCG tablet Take 500 mcg by mouth daily.   alendronate (FOSAMAX) 70 MG tablet TAKE 1 TABLET(70 MG) BY MOUTH 1 TIME A WEEK WITH A FULL GLASS OF WATER AND ON AN EMPTY STOMACH (Patient not taking: Reported on 07/30/2021)   cephALEXin (KEFLEX) 500 MG capsule Take 1 capsule (500 mg total) by mouth 4 (four) times daily. (Patient not taking: Reported on 08/05/2022)   cetirizine (ZYRTEC) 10 MG tablet Take 10  mg by mouth at bedtime. (Patient not taking: Reported on 08/05/2022)   ipratropium (ATROVENT) 0.03 % nasal spray Place 2 sprays into the nose 3 (three) times daily. (Patient taking differently: Place 2 sprays into the nose as needed.)   Omega-3 Fatty Acids (FISH OIL PO) Take by mouth. (Patient not taking: Reported on 07/23/2022)   No facility-administered encounter medications on file as of 08/05/2022.    Allergies (verified) Patient has no known allergies.   History: Past Medical History:  Diagnosis Date   Chronic headaches    Diverticulitis    Hyperlipidemia    Hypertension    Knee pain    Type 2 diabetes mellitus (HCC)    Past Surgical History:  Procedure Laterality Date    Bilateral tubal ligation  1971   BTL     PARTIAL HYSTERECTOMY  1973   TONSILECTOMY, ADENOIDECTOMY, BILATERAL MYRINGOTOMY AND TUBES  1950   VESICOVAGINAL FISTULA CLOSURE W/ TAH     Family History  Problem Relation Age of Onset   Cervical cancer Mother    Cancer Mother    Diabetes Father    Diabetes Sister    Diabetes Brother    Other Brother        killed   Breast cancer Neg Hx    Social History   Socioeconomic History   Marital status: Divorced    Spouse name: Not on file   Number of children: 5   Years of education: Not on file   Highest education level: Not on file  Occupational History   Occupation: retired  Tobacco Use   Smoking status: Never   Smokeless tobacco: Never  Vaping Use   Vaping status: Never Used  Substance and Sexual Activity   Alcohol use: Yes    Comment: occassionally   Drug use: No   Sexual activity: Not Currently    Birth control/protection: Surgical  Other Topics Concern   Not on file  Social History Narrative   Lives alone.   Social Determinants of Health   Financial Resource Strain: Low Risk  (08/05/2022)   Overall Financial Resource Strain (CARDIA)    Difficulty of Paying Living Expenses: Not hard at all  Food Insecurity: No Food Insecurity (08/05/2022)   Hunger Vital Sign    Worried About Running Out of Food in the Last Year: Never true    Ran Out of Food in the Last Year: Never true  Transportation Needs: No Transportation Needs (08/05/2022)   PRAPARE - Administrator, Civil Service (Medical): No    Lack of Transportation (Non-Medical): No  Physical Activity: Inactive (08/05/2022)   Exercise Vital Sign    Days of Exercise per Week: 0 days    Minutes of Exercise per Session: 0 min  Stress: No Stress Concern Present (08/05/2022)   [employer] of Occupational Health - Occupational Stress Questionnaire    Feeling of Stress : Only a little  Social Connections: Moderately Isolated (08/05/2022)   Social Connection and  Isolation Panel [NHANES]    Frequency of Communication with Friends and Family: More than three times a week    Frequency of Social Gatherings with Friends and Family: Once a week    Attends Religious Services: More than 4 times per year    Active Member of [employer] or Organizations: No    Attends Banker Meetings: Never    Marital Status: Divorced    Tobacco Counseling Counseling given: Not Answered   Clinical Intake:  Pre-visit preparation completed: Yes  Pain : No/denies pain     Nutritional Status: BMI 25 -29 Overweight Nutritional Risks: None Diabetes: Yes CBG done?: No Did pt. bring in CBG monitor from home?: No  How often do you need to have someone help you when you read instructions, pamphlets, or other written materials from your doctor or pharmacy?: 1 - Never  Interpreter Needed?: No  Information entered by :: NAllen LPN   Activities of Daily Living    08/05/2022   11:37 AM  In your present state of health, do you have any difficulty performing the following activities:  Hearing? 0  Vision? 1  Comment sometimes  Difficulty concentrating or making decisions? 0  Walking or climbing stairs? 1  Comment depends on how many  Dressing or bathing? 0  Doing errands, shopping? 0  Preparing Food and eating ? N  Using the Toilet? N  In the past six months, have you accidently leaked urine? Y  Comment got to go immediately, wears a pad  Do you have problems with loss of bowel control? N  Managing your Medications? N  Managing your Finances? N  Housekeeping or managing your Housekeeping? N    Patient Care Team: Dorothyann Peng, MD as PCP - General (Internal Medicine) Caudill, Maryjane Hurter, [facility] (Inactive) (Pharmacist)  Indicate any recent Medical Services you may have received from other than [facility] providers in the past year (date may be approximate).     Assessment:   This is a routine wellness examination for Anjulie.  Hearing/Vision  screen Hearing Screening - Comments:: Denies hearing issues Vision Screening - Comments:: Regular eye exams, [place]  Dietary issues and exercise activities discussed:     Goals Addressed             This Visit's Progress    Patient Stated       08/05/2022, denies goals at this time       Depression Screen    08/05/2022   11:48 AM 04/29/2022    2:19 PM 01/28/2022    3:54 PM 11/09/2021    3:14 PM 07/30/2021    9:26 AM 06/25/2020   10:26 AM 06/21/2019   11:30 AM  PHQ 2/9 Scores  PHQ - 2 Score 0 0 0 0 1 0 0  PHQ- 9 Score 1 0     0    Fall Risk    08/05/2022   11:46 AM 07/23/2022    3:21 PM 04/29/2022    2:19 PM 01/28/2022    3:54 PM 11/09/2021    3:13 PM  Fall Risk   Falls in the past year? 1 0 1 1 1   Comment fell down steps, trying to put pants on      Number falls in past yr: 1 0 0 0 0  Injury with Fall? 0 0 1 1 0  Risk for fall due to : History of fall(s);Medication side effect No Fall Risks History of fall(s) History of fall(s) No Fall Risks  Follow up Falls prevention discussed;Falls evaluation completed Falls evaluation completed Falls evaluation completed Falls evaluation completed Falls evaluation completed    MEDICARE RISK AT HOME:  Medicare Risk at Home - 08/05/22 1147     Any stairs in or around the home? Yes    If so, are there any without handrails? No    Home free of loose throw rugs in walkways, pet beds, electrical cords, etc? Yes    Adequate lighting in your home to reduce risk of falls? Yes  Life alert? No    Use of a cane, walker or w/c? No    Grab bars in the bathroom? Yes    Shower chair or bench in shower? No    Elevated toilet seat or a handicapped toilet? No             TIMED UP AND GO:  Was the test performed?  No    Cognitive Function:        08/05/2022   11:49 AM 07/30/2021    9:28 AM 06/25/2020   10:28 AM 06/21/2019   11:34 AM 09/28/2018    9:38 AM  6CIT Screen  What Year? 0 points 0 points 0 points 0 points 0 points   What month? 0 points 0 points 0 points 0 points 0 points  What time? 0 points 0 points 0 points 3 points 0 points  Count back from 20 0 points 0 points 0 points 0 points 0 points  Months in reverse 4 points 2 points 0 points 0 points 0 points  Repeat phrase 6 points 10 points 10 points 0 points 0 points  Total Score 10 points 12 points 10 points 3 points 0 points    Immunizations Immunization History  Administered Date(s) Administered   DTaP 10/28/2015   Fluad Quad(high Dose 65+) 10/19/2018, 10/19/2019, 10/09/2020, 11/09/2021   Influenza Whole 10/11/2009   Influenza, High Dose Seasonal PF 10/21/2017   Influenza-Unspecified 10/19/2018   PFIZER(Purple Top)SARS-COV-2 Vaccination 02/19/2019, 03/16/2019, 12/11/2019, 06/04/2020   PNEUMOCOCCAL CONJUGATE-20 02/05/2021   Pfizer Covid-19 Vaccine Bivalent Booster 10yrs & up 07/08/2021   Pneumococcal-Unspecified 03/28/2013, 10/28/2015   Zoster Recombinant(Shingrix) 05/13/2016, 08/18/2016    TDAP status: Up to date  Flu Vaccine status: Up to date  Pneumococcal vaccine status: Up to date  Covid-19 vaccine status: Completed vaccines  Qualifies for Shingles Vaccine? Yes   Zostavax completed Yes   Shingrix Completed?: Yes  Screening Tests Health Maintenance  Topic Date Due   OPHTHALMOLOGY EXAM  06/19/2020   COVID-19 Vaccine (6 - 2023-24 season) 08/21/2022 (Originally 09/11/2021)   INFLUENZA VACCINE  08/12/2022   FOOT EXAM  11/10/2022   HEMOGLOBIN A1C  01/23/2023   Diabetic kidney evaluation - eGFR measurement  07/23/2023   Diabetic kidney evaluation - Urine ACR  07/23/2023   Medicare Annual Wellness (AWV)  08/05/2023   DTaP/Tdap/Td (2 - Tdap) 10/27/2025   Pneumonia Vaccine 73+ Years old  Completed   DEXA SCAN  Completed   Zoster Vaccines- Shingrix  Completed   HPV VACCINES  Aged Out    Health Maintenance  Health Maintenance Due  Topic Date Due   OPHTHALMOLOGY EXAM  06/19/2020    Colorectal cancer screening: No longer  required.   Mammogram status: No longer required due to age.  Bone Density status: Completed 06/05/2020.   Lung Cancer Screening: (Low Dose CT Chest recommended if Age 45-80 years, 20 pack-year currently smoking OR have quit w/in 15years.) does not qualify.   Lung Cancer Screening Referral: no  Additional Screening:  Hepatitis C Screening: does not qualify;   Vision Screening: Recommended annual ophthalmology exams for early detection of glaucoma and other disorders of the eye. Is the patient up to date with their annual eye exam?  No  Who is the provider or what is the name of the office in which the patient attends annual eye exams? Dr. Harriette Bouillon If pt is not established with a provider, would they like to be referred to a provider to establish care? No .  Dental Screening: Recommended annual dental exams for proper oral hygiene  Diabetic Foot Exam: Diabetic Foot Exam: Completed 11/09/2021  Community Resource Referral / Chronic Care Management: CRR required this visit?  No   CCM required this visit?  No     Plan:     I have personally reviewed and noted the following in the patient's chart:   Medical and social history Use of alcohol, tobacco or illicit drugs  Current medications and supplements including opioid prescriptions. Patient is not currently taking opioid prescriptions. Functional ability and status Nutritional status Physical activity Advanced directives List of other physicians Hospitalizations, surgeries, and ER visits in previous 12 months Vitals Screenings to include cognitive, depression, and falls Referrals and appointments  In addition, I have reviewed and discussed with patient certain preventive protocols, quality metrics, and best practice recommendations. A written personalized care plan for preventive services as well as general preventive health recommendations were provided to patient.     Barb Merino, LPN   1/[id number]/[id number]   After Visit  Summary: (Pick Up) Due to this being a telephonic visit, with patients personalized plan was offered to patient and patient has requested to Pick up at office.  Nurse Notes: none

## 2022-08-05 NOTE — Patient Instructions (Signed)
Vickie Brown , Thank you for taking time to come for your Medicare Wellness Visit. I appreciate your ongoing commitment to your health goals. Please review the following plan we discussed and let me know if I can assist you in the future.   Referrals/Orders/Follow-Ups/Clinician Recommendations:  none  This is a list of the screening recommended for you and due dates:  Health Maintenance  Topic Date Due   Eye exam for diabetics  06/19/2020   COVID-19 Vaccine (6 - 2023-24 season) 08/21/2022*   Flu Shot  08/12/2022   Complete foot exam   11/10/2022   Hemoglobin A1C  01/23/2023   Yearly kidney function blood test for diabetes  07/23/2023   Yearly kidney health urinalysis for diabetes  07/23/2023   Medicare Annual Wellness Visit  08/05/2023   DTaP/Tdap/Td vaccine (2 - Tdap) 10/27/2025   Pneumonia Vaccine  Completed   DEXA scan (bone density measurement)  Completed   Zoster (Shingles) Vaccine  Completed   HPV Vaccine  Aged Out  *Topic was postponed. The date shown is not the original due date.    Advanced directives: (Declined) Advance directive discussed with you today.   Next Medicare Annual Wellness Visit scheduled for next year: No, will be made by office  Preventive Care 65 Years and Older, Female Preventive care refers to lifestyle choices and visits with your health care provider that can promote health and wellness. What does preventive care include? A yearly physical exam. This is also called an annual well check. Dental exams once or twice a year. Routine eye exams. Ask your health care provider how often you should have your eyes checked. Personal lifestyle choices, including: Daily care of your teeth and gums. Regular physical activity. Eating a healthy diet. Avoiding tobacco and drug use. Limiting alcohol use. Practicing safe sex. Taking low-dose aspirin every day. Taking vitamin and mineral supplements as recommended by your health care provider. What happens during an  annual well check? The services and screenings done by your health care provider during your annual well check will depend on your age, overall health, lifestyle risk factors, and family history of disease. Counseling  Your health care provider may ask you questions about your: Alcohol use. Tobacco use. Drug use. Emotional well-being. Home and relationship well-being. Sexual activity. Eating habits. History of falls. Memory and ability to understand (cognition). Work and work Astronomer. Reproductive health. Screening  You may have the following tests or measurements: Height, weight, and BMI. Blood pressure. Lipid and cholesterol levels. These may be checked every 5 years, or more frequently if you are over 70 years old. Skin check. Lung cancer screening. You may have this screening every year starting at age 15 if you have a 30-pack-year history of smoking and currently smoke or have quit within the past 15 years. Fecal occult blood test (FOBT) of the stool. You may have this test every year starting at age 47. Flexible sigmoidoscopy or colonoscopy. You may have a sigmoidoscopy every 5 years or a colonoscopy every 10 years starting at age 76. Hepatitis C blood test. Hepatitis B blood test. Sexually transmitted disease (STD) testing. Diabetes screening. This is done by checking your blood sugar (glucose) after you have not eaten for a while (fasting). You may have this done every 1-3 years. Bone density scan. This is done to screen for osteoporosis. You may have this done starting at age 18. Mammogram. This may be done every 1-2 years. Talk to your health care provider about how often you  should have regular mammograms. Talk with your health care provider about your test results, treatment options, and if necessary, the need for more tests. Vaccines  Your health care provider may recommend certain vaccines, such as: Influenza vaccine. This is recommended every year. Tetanus,  diphtheria, and acellular pertussis (Tdap, Td) vaccine. You may need a Td booster every 10 years. Zoster vaccine. You may need this after age 24. Pneumococcal 13-valent conjugate (PCV13) vaccine. One dose is recommended after age 66. Pneumococcal polysaccharide (PPSV23) vaccine. One dose is recommended after age 30. Talk to your health care provider about which screenings and vaccines you need and how often you need them. This information is not intended to replace advice given to you by your health care provider. Make sure you discuss any questions you have with your health care provider. Document Released: 01/24/2015 Document Revised: 09/17/2015 Document Reviewed: 10/29/2014 Elsevier Interactive Patient Education  2017 [employer].  Fall Prevention in the Home Falls can cause injuries. They can happen to people of all ages. There are many things you can do to make your home safe and to help prevent falls. What can I do on the outside of my home? Regularly fix the edges of walkways and driveways and fix any cracks. Remove anything that might make you trip as you walk through a door, such as a raised step or threshold. Trim any bushes or trees on the path to your home. Use bright outdoor lighting. Clear any walking paths of anything that might make someone trip, such as rocks or tools. Regularly check to see if handrails are loose or broken. Make sure that both sides of any steps have handrails. Any raised decks and porches should have guardrails on the edges. Have any leaves, snow, or ice cleared regularly. Use sand or salt on walking paths during winter. Clean up any spills in your garage right away. This includes oil or grease spills. What can I do in the bathroom? Use night lights. Install grab bars by the toilet and in the tub and shower. Do not use towel bars as grab bars. Use non-skid mats or decals in the tub or shower. If you need to sit down in the shower, use a plastic,  non-slip stool. Keep the floor dry. Clean up any water that spills on the floor as soon as it happens. Remove soap buildup in the tub or shower regularly. Attach bath mats securely with double-sided non-slip rug tape. Do not have throw rugs and other things on the floor that can make you trip. What can I do in the bedroom? Use night lights. Make sure that you have a light by your bed that is easy to reach. Do not use any sheets or blankets that are too big for your bed. They should not hang down onto the floor. Have a firm chair that has side arms. You can use this for support while you get dressed. Do not have throw rugs and other things on the floor that can make you trip. What can I do in the kitchen? Clean up any spills right away. Avoid walking on wet floors. Keep items that you use a lot in easy-to-reach places. If you need to reach something above you, use a strong step stool that has a grab bar. Keep electrical cords out of the way. Do not use floor polish or wax that makes floors slippery. If you must use wax, use non-skid floor wax. Do not have throw rugs and other things on the  floor that can make you trip. What can I do with my stairs? Do not leave any items on the stairs. Make sure that there are handrails on both sides of the stairs and use them. Fix handrails that are broken or loose. Make sure that handrails are as long as the stairways. Check any carpeting to make sure that it is firmly attached to the stairs. Fix any carpet that is loose or worn. Avoid having throw rugs at the top or bottom of the stairs. If you do have throw rugs, attach them to the floor with carpet tape. Make sure that you have a light switch at the top of the stairs and the bottom of the stairs. If you do not have them, ask someone to add them for you. What else can I do to help prevent falls? Wear shoes that: Do not have high heels. Have rubber bottoms. Are comfortable and fit you well. Are closed  at the toe. Do not wear sandals. If you use a stepladder: Make sure that it is fully opened. Do not climb a closed stepladder. Make sure that both sides of the stepladder are locked into place. Ask someone to hold it for you, if possible. Clearly mark and make sure that you can see: Any grab bars or handrails. First and last steps. Where the edge of each step is. Use tools that help you move around (mobility aids) if they are needed. These include: Canes. Walkers. Scooters. Crutches. Turn on the lights when you go into a dark area. Replace any light bulbs as soon as they burn out. Set up your furniture so you have a clear path. Avoid moving your furniture around. If any of your floors are uneven, fix them. If there are any pets around you, be aware of where they are. Review your medicines with your doctor. Some medicines can make you feel dizzy. This can increase your chance of falling. Ask your doctor what other things that you can do to help prevent falls. This information is not intended to replace advice given to you by your health care provider. Make sure you discuss any questions you have with your health care provider. Document Released: 10/24/2008 Document Revised: 06/05/2015 Document Reviewed: 02/01/2014 Elsevier Interactive Patient Education  2017 [employer].

## 2022-08-07 NOTE — Assessment & Plan Note (Signed)
Chronic, currently on DPP4i, Januvia 100mg  daily. I would like to consider Farxiga or other SGTL2i for cardiac/renal protection. Will discuss further at next visit. I will check labs as below, she will rto in 4 months for re-evaluation.

## 2022-08-07 NOTE — Assessment & Plan Note (Signed)
Likely exacerbated by poor sleep habits. She was given vitamin B12 IM x 1. She will let me know if she has increased energy with this.

## 2022-08-07 NOTE — Assessment & Plan Note (Signed)
Chronic, well controlled. She will c/w losartan 50mg  daily. She is encouraged to follow low sodium diet.

## 2022-08-07 NOTE — Assessment & Plan Note (Signed)
Chronic. She is encouraged to stay well hydrated. I will check a GFR, Cr today.

## 2022-08-07 NOTE — Assessment & Plan Note (Signed)
Chronic, she does wish to try pharmaceutical options. May benefit from magnesium glycinate nightly +/- melatonin. If she changes her mind, I will consider trazodone nightly.

## 2022-08-07 NOTE — Assessment & Plan Note (Signed)
Chronic, LDL goal < 70.  She will c/w pravastatin 40mg  daily. She is encouraged to follow heart healthy lifestyle.

## 2022-09-17 DIAGNOSIS — E1165 Type 2 diabetes mellitus with hyperglycemia: Secondary | ICD-10-CM | POA: Diagnosis not present

## 2022-09-17 DIAGNOSIS — E1169 Type 2 diabetes mellitus with other specified complication: Secondary | ICD-10-CM | POA: Diagnosis not present

## 2022-09-17 DIAGNOSIS — E119 Type 2 diabetes mellitus without complications: Secondary | ICD-10-CM | POA: Diagnosis not present

## 2022-09-27 ENCOUNTER — Other Ambulatory Visit: Payer: Self-pay | Admitting: Internal Medicine

## 2022-11-02 LAB — HM DIABETES EYE EXAM

## 2022-11-16 ENCOUNTER — Other Ambulatory Visit: Payer: Self-pay | Admitting: Internal Medicine

## 2022-11-16 ENCOUNTER — Encounter: Payer: Self-pay | Admitting: Internal Medicine

## 2022-11-16 ENCOUNTER — Ambulatory Visit (INDEPENDENT_AMBULATORY_CARE_PROVIDER_SITE_OTHER): Payer: Medicare Other | Admitting: Internal Medicine

## 2022-11-16 VITALS — BP 110/68 | HR 85 | Temp 98.0°F | Ht 63.0 in | Wt 140.2 lb

## 2022-11-16 DIAGNOSIS — E6609 Other obesity due to excess calories: Secondary | ICD-10-CM

## 2022-11-16 DIAGNOSIS — E1122 Type 2 diabetes mellitus with diabetic chronic kidney disease: Secondary | ICD-10-CM | POA: Diagnosis not present

## 2022-11-16 DIAGNOSIS — N182 Chronic kidney disease, stage 2 (mild): Secondary | ICD-10-CM

## 2022-11-16 DIAGNOSIS — I1310 Hypertensive heart and chronic kidney disease without heart failure, with stage 1 through stage 4 chronic kidney disease, or unspecified chronic kidney disease: Secondary | ICD-10-CM | POA: Diagnosis not present

## 2022-11-16 DIAGNOSIS — M810 Age-related osteoporosis without current pathological fracture: Secondary | ICD-10-CM

## 2022-11-16 DIAGNOSIS — I700 Atherosclerosis of aorta: Secondary | ICD-10-CM | POA: Diagnosis not present

## 2022-11-16 DIAGNOSIS — E66811 Obesity, class 1: Secondary | ICD-10-CM

## 2022-11-16 DIAGNOSIS — Z6830 Body mass index (BMI) 30.0-30.9, adult: Secondary | ICD-10-CM

## 2022-11-16 DIAGNOSIS — K5909 Other constipation: Secondary | ICD-10-CM | POA: Insufficient documentation

## 2022-11-16 DIAGNOSIS — E2839 Other primary ovarian failure: Secondary | ICD-10-CM

## 2022-11-16 NOTE — Assessment & Plan Note (Signed)
Chronic, LDL goal < 70.  She will c/w pravastatin 40mg  daily. She is encouraged to follow heart healthy lifestyle.

## 2022-11-16 NOTE — Progress Notes (Signed)
I,Victoria T Deloria Lair, CMA,acting as a Neurosurgeon for Gwynneth Aliment, MD.,have documented all relevant documentation on the behalf of Gwynneth Aliment, MD,as directed by  Gwynneth Aliment, MD while in the presence of Gwynneth Aliment, MD.  Subjective:    Patient ID: Vickie Brown , female    DOB: [birth year] , 82 y.o.   MRN: [id number]  Chief Complaint  Patient presents with   Diabetes   Hypertension    HPI  Patient presents today for diabetes & bp follow up. She arrived 15 minutes late to her originally scheduled physical appointment today. Changed to office visit for routine follow up.   She reports compliance with meds. She denies chest pain, shortness of breath and palpitations.        Diabetes She presents for her follow-up diabetic visit. She has type 2 diabetes mellitus. Her disease course has been stable. There are no hypoglycemic associated symptoms. Pertinent negatives for hypoglycemia include no headaches. Pertinent negatives for diabetes include no blurred vision, no chest pain, no polydipsia, no polyphagia and no polyuria. There are no hypoglycemic complications. Risk factors for coronary artery disease include diabetes mellitus, dyslipidemia, hypertension and post-menopausal. She is following a diabetic diet. She participates in exercise intermittently. Her breakfast blood glucose is taken between 8-9 am. Her breakfast blood glucose range is generally 90-110 mg/dl. An ACE inhibitor/angiotensin II receptor blocker is being taken. She does not see a podiatrist. Hypertension This is a chronic problem. The current episode started more than 1 year ago. The problem has been gradually improving since onset. Pertinent negatives include no blurred vision, chest pain, headaches, palpitations or shortness of breath. The current treatment provides moderate improvement. Hypertensive end-organ damage includes kidney disease. Identifiable causes of hypertension include chronic renal disease.      Past Medical History:  Diagnosis Date   Chronic headaches    Diverticulitis    Hyperlipidemia    Hypertension    Knee pain    Type 2 diabetes mellitus (HCC)      Family History  Problem Relation Age of Onset   Cervical cancer Mother    Cancer Mother    Diabetes Father    Diabetes Sister    Diabetes Brother    Other Brother        killed   Breast cancer Neg Hx      Current Outpatient Medications:    acetaminophen (TYLENOL) 325 MG tablet, Take 650 mg by mouth every 6 (six) hours as needed., Disp: , Rfl:    ascorbic acid (VITAMIN C) 500 MG tablet, Take 500 mg by mouth daily., Disp: , Rfl:    aspirin 81 MG tablet, Take 81 mg by mouth every evening., Disp: , Rfl:    BIOTIN PO, Take by mouth daily., Disp: , Rfl:    Calcium Carbonate (CALCIUM 500 PO), Take by mouth., Disp: , Rfl:    Cholecalciferol (VITAMIN D3) 2000 UNITS TABS, Take 1 tablet by mouth daily., Disp: , Rfl:    fluticasone (FLONASE) 50 MCG/ACT nasal spray, Place 1 spray into both nostrils at bedtime., Disp: 16 g, Rfl: 2   Homeopathic Products (OSCILLOCOCCINUM PO), Take by mouth., Disp: , Rfl:    JANUVIA 100 MG tablet, TAKE 1 TABLET(100 MG) BY MOUTH DAILY, Disp: 90 tablet, Rfl: 1   losartan (COZAAR) 50 MG tablet, TAKE 1 TABLET(50 MG) BY MOUTH DAILY, Disp: 90 tablet, Rfl: 1   Multiple Vitamins-Minerals (ALIVE ONCE DAILY WOMENS 50+ PO), Take 1 tablet by mouth daily., Disp: ,  Rfl:    pravastatin (PRAVACHOL) 40 MG tablet, Take 1 tablet (40 mg total) by mouth daily., Disp: 90 tablet, Rfl: 1   TURMERIC PO, Take by mouth., Disp: , Rfl:    UNABLE TO FIND, Med Name: Unkers therapeutic Rub, Disp: , Rfl:    vitamin B-12 (CYANOCOBALAMIN) 500 MCG tablet, Take 500 mcg by mouth daily., Disp: , Rfl:    alendronate (FOSAMAX) 70 MG tablet, TAKE 1 TABLET(70 MG) BY MOUTH 1 TIME A WEEK WITH A FULL GLASS OF WATER AND ON AN EMPTY STOMACH (Patient not taking: Reported on 07/30/2021), Disp: 12 tablet, Rfl: 3   cephALEXin (KEFLEX) 500 MG  capsule, Take 1 capsule (500 mg total) by mouth 4 (four) times daily. (Patient not taking: Reported on 08/05/2022), Disp: 28 capsule, Rfl: 0   cetirizine (ZYRTEC) 10 MG tablet, Take 10 mg by mouth at bedtime. (Patient not taking: Reported on 08/05/2022), Disp: , Rfl:    ibuprofen (ADVIL) 600 MG tablet, Take 1 tablet (600 mg total) by mouth every 6 (six) hours as needed. (Patient not taking: Reported on 11/16/2022), Disp: 30 tablet, Rfl: 0   ipratropium (ATROVENT) 0.03 % nasal spray, Place 2 sprays into the nose 3 (three) times daily. (Patient taking differently: Place 2 sprays into the nose as needed.), Disp: 30 mL, Rfl: 2   Omega-3 Fatty Acids (FISH OIL PO), Take by mouth. (Patient not taking: Reported on 07/23/2022), Disp: , Rfl:    Probiotic Product (ALIGN) 4 MG CAPS, Take 4 mg by mouth daily. (Patient not taking: Reported on 11/16/2022), Disp: , Rfl:    traMADol (ULTRAM) 50 MG tablet, Take 1 tablet (50 mg total) by mouth every 12 (twelve) hours as needed for severe pain. (Patient not taking: Reported on 11/16/2022), Disp: 30 tablet, Rfl: 0   No Known Allergies    Review of Systems  Constitutional: Negative.   HENT: Negative.    Eyes: Negative.  Negative for blurred vision.  Respiratory: Negative.  Negative for shortness of breath.   Cardiovascular: Negative.  Negative for chest pain and palpitations.  Gastrointestinal:  Positive for constipation.  Endocrine: Negative.  Negative for polydipsia, polyphagia and polyuria.  Genitourinary: Negative.   Musculoskeletal: Negative.   Skin: Negative.   Allergic/Immunologic: Negative.   Neurological: Negative.  Negative for headaches.  Hematological: Negative.   Psychiatric/Behavioral: Negative.       Today's Vitals   11/16/22 1421  BP: 110/68  Pulse: 85  Temp: 98 F (36.7 C)  SpO2: 98%  Weight: 140 lb 3.2 oz (63.6 kg)  Height: 5\' 3"  (1.6 m)   Body mass index is 24.84 kg/m.  Wt Readings from Last 3 Encounters:  11/16/22 140 lb 3.2 oz (63.6  kg)  08/05/22 145 lb (65.8 kg)  07/23/22 145 lb 3.2 oz (65.9 kg)     Objective:  Physical Exam Vitals and nursing note reviewed.  Constitutional:      Appearance: Normal appearance.  HENT:     Head: Normocephalic and atraumatic.  Eyes:     Extraocular Movements: Extraocular movements intact.  Cardiovascular:     Rate and Rhythm: Normal rate and regular rhythm.     Heart sounds: Normal heart sounds.  Pulmonary:     Effort: Pulmonary effort is normal.     Breath sounds: Normal breath sounds.  Abdominal:     General: Bowel sounds are normal.     Palpations: Abdomen is soft.  Musculoskeletal:     Cervical back: Normal range of motion.  Skin:  General: Skin is warm.  Neurological:     General: No focal deficit present.     Mental Status: She is alert.  Psychiatric:        Mood and Affect: Mood normal.        Behavior: Behavior normal.         Assessment And Plan:     Type 2 diabetes mellitus with stage 2 chronic kidney disease, without long-term current use of insulin (HCC) Assessment & Plan: Chronic, currently on DPP4i, Januvia 100mg  daily. I would like to consider Farxiga or other SGTL2i for cardiac/renal protection. She does not wish to switch since she has full rx. She agrees to consider in the near future.  I will check labs as below, she will rto in 4 months for physical exam.   Orders: -     CMP14+EGFR -     Hemoglobin A1c  Hypertensive heart and renal disease with renal failure, stage 1 through stage 4 or unspecified chronic kidney disease, without heart failure Assessment & Plan: Chronic, well controlled. She will c/w losartan 50mg  daily. She is encouraged to follow low sodium diet.   Orders: -     CMP14+EGFR  Atherosclerosis of aorta (HCC) Assessment & Plan: Chronic, LDL goal < 70.  She will c/w pravastatin 40mg  daily. She is encouraged to follow heart healthy lifestyle.    Age-related osteoporosis without current pathological fracture Assessment &  Plan: Chronic, she admits to non-compliance with alendronate. I will check labs as below. Bone density ordered earlier this year. Will have referral coordinator to [facility]. If labs are acceptable, I will resume alendronate. If not tolerated, she agrees to Rheumatology evaluation to discuss other options.   Orders: -     CMP14+EGFR -     VITAMIN D 25 Hydroxy (Vit-D Deficiency, Fractures) -     PTH, intact and calcium  Other constipation Assessment & Plan: She admits to decreased activity and decreased fiber intake. Encouraged to increase daily activity and to increase her intake of fruits/veggies. She plans to try what her sister brought her as well, ??Miralax?    Class 1 obesity due to excess calories with serious comorbidity and body mass index (BMI) of 30.0 to 30.9 in adult Assessment & Plan: Her BMI is acceptable for her demographic. Advised to aim for at least 150 minutes of exercise per week.        Return in 4 months (on 03/16/2023), or physical. Patient was given opportunity to ask questions. Patient verbalized understanding of the plan and was able to repeat key elements of the plan. All questions were answered to their satisfaction.   I, Gwynneth Aliment, MD, have reviewed all documentation for this visit. The documentation on 11/16/22 for the exam, diagnosis, procedures, and orders are all accurate and complete.

## 2022-11-16 NOTE — Assessment & Plan Note (Signed)
Chronic, well controlled. She will c/w losartan 50mg  daily. She is encouraged to follow low sodium diet.

## 2022-11-16 NOTE — Patient Instructions (Addendum)

## 2022-11-16 NOTE — Assessment & Plan Note (Addendum)
Chronic, she admits to non-compliance with alendronate. I will check labs as below. Bone density ordered earlier this year. Will have referral coordinator to call and [facility]. If labs are acceptable, I will resume alendronate. If not tolerated, she agrees to Rheumatology evaluation to discuss other options.

## 2022-11-16 NOTE — Assessment & Plan Note (Signed)
Her BMI is acceptable for her demographic. Advised to aim for at least 150 minutes of exercise per week.

## 2022-11-16 NOTE — Assessment & Plan Note (Signed)
She admits to decreased activity and decreased fiber intake. Encouraged to increase daily activity and to increase her intake of fruits/veggies. She plans to try what her sister brought her as well, ??Miralax?

## 2022-11-16 NOTE — Assessment & Plan Note (Signed)
Chronic, currently on DPP4i, Januvia 100mg  daily. I would like to consider Farxiga or other SGTL2i for cardiac/renal protection. She does not wish to switch since she has full rx. She agrees to consider in the near future.  I will check labs as below, she will rto in 4 months for physical exam.

## 2022-11-17 LAB — CMP14+EGFR
ALT: 15 [IU]/L (ref 0–32)
AST: 25 [IU]/L (ref 0–40)
Albumin: 4.2 g/dL (ref 3.7–4.7)
Alkaline Phosphatase: 70 [IU]/L (ref 44–121)
BUN/Creatinine Ratio: 8 — ABNORMAL LOW (ref 12–28)
BUN: 8 mg/dL (ref 8–27)
Bilirubin Total: 0.2 mg/dL (ref 0.0–1.2)
CO2: 28 mmol/L (ref 20–29)
Calcium: 9.4 mg/dL (ref 8.7–10.3)
Chloride: 103 mmol/L (ref 96–106)
Creatinine, Ser: 0.98 mg/dL (ref 0.57–1.00)
Globulin, Total: 2.4 g/dL (ref 1.5–4.5)
Glucose: 104 mg/dL — ABNORMAL HIGH (ref 70–99)
Potassium: 4.4 mmol/L (ref 3.5–5.2)
Sodium: 143 mmol/L (ref 134–144)
Total Protein: 6.6 g/dL (ref 6.0–8.5)
eGFR: 57 mL/min/{1.73_m2} — ABNORMAL LOW (ref >59)

## 2022-11-17 LAB — VITAMIN D 25 HYDROXY (VIT D DEFICIENCY, FRACTURES): Vit D, 25-Hydroxy: 63 ng/mL (ref 30.0–100.0)

## 2022-11-17 LAB — PTH, INTACT AND CALCIUM: PTH: 39 pg/mL (ref 15–65)

## 2022-11-17 LAB — HEMOGLOBIN A1C
Est. average glucose Bld gHb Est-mCnc: 134 mg/dL
Hgb A1c MFr Bld: 6.3 % — ABNORMAL HIGH (ref 4.8–5.6)

## 2023-01-26 ENCOUNTER — Emergency Department (HOSPITAL_BASED_OUTPATIENT_CLINIC_OR_DEPARTMENT_OTHER)
Admission: EM | Admit: 2023-01-26 | Discharge: 2023-01-26 | Disposition: A | Discharge status: Home / Self Care | Payer: Medicare Other | Attending: Emergency Medicine | Admitting: Emergency Medicine

## 2023-01-26 ENCOUNTER — Encounter (HOSPITAL_BASED_OUTPATIENT_CLINIC_OR_DEPARTMENT_OTHER): Payer: Self-pay | Admitting: Emergency Medicine

## 2023-01-26 ENCOUNTER — Emergency Department (HOSPITAL_BASED_OUTPATIENT_CLINIC_OR_DEPARTMENT_OTHER): Payer: Medicare Other

## 2023-01-26 ENCOUNTER — Other Ambulatory Visit: Payer: Self-pay

## 2023-01-26 DIAGNOSIS — Z79899 Other long term (current) drug therapy: Secondary | ICD-10-CM | POA: Insufficient documentation

## 2023-01-26 DIAGNOSIS — R519 Headache, unspecified: Secondary | ICD-10-CM | POA: Insufficient documentation

## 2023-01-26 DIAGNOSIS — I10 Essential (primary) hypertension: Secondary | ICD-10-CM | POA: Insufficient documentation

## 2023-01-26 DIAGNOSIS — E119 Type 2 diabetes mellitus without complications: Secondary | ICD-10-CM | POA: Insufficient documentation

## 2023-01-26 DIAGNOSIS — Z7982 Long term (current) use of aspirin: Secondary | ICD-10-CM | POA: Insufficient documentation

## 2023-01-26 MED ORDER — KETOROLAC TROMETHAMINE 15 MG/ML IJ SOLN: 15.0000 mg | Freq: Once | INTRAMUSCULAR | Status: DC

## 2023-01-26 MED ORDER — METOCLOPRAMIDE HCL 5 MG/ML IJ SOLN
10.0000 mg | Freq: Once | INTRAMUSCULAR | Status: AC
  Administered 2023-01-26: 10 mg via INTRAVENOUS
  Filled 2023-01-26: qty 2

## 2023-01-26 MED ORDER — DIPHENHYDRAMINE HCL 50 MG/ML IJ SOLN
12.5000 mg | Freq: Once | INTRAMUSCULAR | Status: AC
  Administered 2023-01-26: 12.5 mg via INTRAVENOUS
  Filled 2023-01-26: qty 1

## 2023-01-26 MED ORDER — SODIUM CHLORIDE 0.9 % IV BOLUS
1000.0000 mL | Freq: Once | INTRAVENOUS | Status: AC
  Administered 2023-01-26: 1000 mL via INTRAVENOUS

## 2023-01-26 MED ORDER — DEXAMETHASONE SODIUM PHOSPHATE 4 MG/ML IJ SOLN
4.0000 mg | Freq: Once | INTRAMUSCULAR | Status: AC
  Administered 2023-01-26: 4 mg via INTRAVENOUS
  Filled 2023-01-26: qty 1

## 2023-01-26 NOTE — ED Notes (Signed)
 Pt alert and oriented X 4 at the time of discharge. RR even and unlabored. No acute distress noted. Pt verbalized understanding of discharge instructions as discussed. Pt ambulatory to lobby at time of discharge.

## 2023-01-26 NOTE — ED Provider Notes (Signed)
[place] [facility] Provider Note   CSN: [id number] Arrival date & time: 01/26/23  1230     History  Chief Complaint  Patient presents with   Headache    Vickie Brown is a 83 y.o. female with a history of chronic headaches, type 2 diabetes mellitus, and hypertension who presents the ED today for headache.  Patient reports she has a headache on the right side of her head that began yesterday.  She states that symptoms have improved but she feels like it might be coming back.  She took Tylenol yesterday with some improvement of her symptoms.  Denies associated weakness, vision changes, or slurred speech.  She states that when she has had headaches in the past that usually across her forehead and not at the right side of her head.  Additionally, patient reports that she hit her head while opening up her medicine cabinet in the bathroom 2 weeks ago and does not know if her headache is associated with that since she hit her head at the same side where her headache is today.  No associated photophobia, phonophobia, nausea, or vomiting.  No additional complaints or concerns at this time.    Home Medications Prior to Admission medications   Medication Sig Start Date End Date Taking? Authorizing Provider  acetaminophen (TYLENOL) 325 MG tablet Take 650 mg by mouth every 6 (six) hours as needed.    [provider]  alendronate (FOSAMAX) 70 MG tablet TAKE 1 TABLET(70 MG) BY MOUTH 1 TIME A WEEK WITH A FULL GLASS OF WATER AND ON AN EMPTY STOMACH Patient not taking: Reported on 07/30/2021 04/27/21   Fuller Plan, MD  ascorbic acid (VITAMIN C) 500 MG tablet Take 500 mg by mouth daily.    [provider]  aspirin 81 MG tablet Take 81 mg by mouth every evening.    [provider]  BIOTIN PO Take by mouth daily.    [provider]  Calcium Carbonate (CALCIUM 500 PO) Take by mouth.    [provider]  cephALEXin  (KEFLEX) 500 MG capsule Take 1 capsule (500 mg total) by mouth 4 (four) times daily. Patient not taking: Reported on 08/05/2022 07/15/22   Jeanelle Malling, PA  cetirizine (ZYRTEC) 10 MG tablet Take 10 mg by mouth at bedtime. Patient not taking: Reported on 08/05/2022    [provider]  Cholecalciferol (VITAMIN D3) 2000 UNITS TABS Take 1 tablet by mouth daily.    [provider]  fluticasone (FLONASE) 50 MCG/ACT nasal spray Place 1 spray into both nostrils at bedtime. 07/08/13   Garnetta Buddy, MD  Homeopathic Products (OSCILLOCOCCINUM PO) Take by mouth.    [provider]  ibuprofen (ADVIL) 600 MG tablet Take 1 tablet (600 mg total) by mouth every 6 (six) hours as needed. Patient not taking: Reported on 11/16/2022 07/15/22   Jeanelle Malling, PA  ipratropium (ATROVENT) 0.03 % nasal spray Place 2 sprays into the nose 3 (three) times daily. Patient taking differently: Place 2 sprays into the nose as needed. 03/10/20 05/04/21  Arnette Felts, FNP  JANUVIA 100 MG tablet TAKE 1 TABLET(100 MG) BY MOUTH DAILY 09/28/22   Dorothyann Peng, MD  losartan (COZAAR) 50 MG tablet TAKE 1 TABLET(50 MG) BY MOUTH DAILY 07/23/22   Dorothyann Peng, MD  Multiple Vitamins-Minerals (ALIVE ONCE DAILY WOMENS 50+ PO) Take 1 tablet by mouth daily.    [provider]  Omega-3 Fatty Acids (FISH OIL PO) Take  by mouth. Patient not taking: Reported on 07/23/2022    [provider]  pravastatin (PRAVACHOL) 40 MG tablet Take 1 tablet (40 mg total) by mouth daily. 07/23/22   Dorothyann Peng, MD  Probiotic Product (ALIGN) 4 MG CAPS Take 4 mg by mouth daily. Patient not taking: Reported on 11/16/2022    [provider]  traMADol (ULTRAM) 50 MG tablet Take 1 tablet (50 mg total) by mouth every 12 (twelve) hours as needed for severe pain. Patient not taking: Reported on 11/16/2022 04/29/22   Dorothyann Peng, MD  TURMERIC PO Take by mouth.    [provider]  UNABLE TO FIND Med Name: Unkers therapeutic  Rub    [provider]  vitamin B-12 (CYANOCOBALAMIN) 500 MCG tablet Take 500 mcg by mouth daily.    [provider]      Allergies    Patient has no known allergies.    Review of Systems   Review of Systems  Neurological:  Positive for headaches.  All other systems reviewed and are negative.   Physical Exam Updated Vital Signs BP 113/61   Pulse 65   Temp 97.8 F (36.6 C) (Oral)   Resp 17   Ht 5\' 3"  (1.6 m)   Wt 63.6 kg   SpO2 98%   BMI 24.84 kg/m  Physical Exam Vitals and nursing note reviewed.  Constitutional:      General: She is not in acute distress.    Appearance: Normal appearance.  HENT:     Head: Normocephalic and atraumatic.     Mouth/Throat:     Mouth: Mucous membranes are moist.  Eyes:     Extraocular Movements: Extraocular movements intact.     Conjunctiva/sclera: Conjunctivae normal.     Pupils: Pupils are equal, round, and reactive to light.  Cardiovascular:     Rate and Rhythm: Normal rate and regular rhythm.     Pulses: Normal pulses.     Heart sounds: Normal heart sounds.  Pulmonary:     Effort: Pulmonary effort is normal.     Breath sounds: Normal breath sounds.  Abdominal:     Palpations: Abdomen is soft.     Tenderness: There is no abdominal tenderness.  Musculoskeletal:        General: Normal range of motion.     Cervical back: Normal range of motion. No tenderness.  Skin:    General: Skin is warm and dry.     Findings: No rash.  Neurological:     General: No focal deficit present.     Mental Status: She is alert.     Sensory: No sensory deficit.     Motor: No weakness.     Gait: Gait normal.  Psychiatric:        Mood and Affect: Mood normal.        Behavior: Behavior normal.     ED Results / Procedures / Treatments   Labs (all labs ordered are listed, but only abnormal results are displayed) Labs Reviewed - No data to display  EKG None  Radiology CT Head Wo Contrast Result Date: 01/26/2023 CLINICAL  DATA:  Right-sided headache that started yesterday morning EXAM: CT HEAD WITHOUT CONTRAST TECHNIQUE: Contiguous axial images were obtained from the base of the skull through the vertex without intravenous contrast. RADIATION DOSE REDUCTION: This exam was performed according to the departmental dose-optimization program which includes automated exposure control, adjustment of the mA and/or kV according to patient size and/or use of iterative reconstruction technique.  COMPARISON:  01/25/2022 FINDINGS: Brain: No evidence of acute infarction, hemorrhage, mass, mass effect, or midline shift. No hydrocephalus or extra-axial fluid collection. Vascular: No hyperdense vessel. Skull: Negative for fracture or focal lesion. Hyperostosis frontalis. Sinuses/Orbits: No acute finding. Other: The mastoid air cells are well aerated. IMPRESSION: No acute intracranial process. Electronically Signed   By: Wiliam Ke M.D.   On: 01/26/2023 13:36    Procedures Procedures: not indicated.   Medications Ordered in ED Medications  sodium chloride 0.9 % bolus 1,000 mL (0 mLs Intravenous Stopped 01/26/23 1502)  metoCLOPramide (REGLAN) injection 10 mg (10 mg Intravenous Given 01/26/23 1354)  diphenhydrAMINE (BENADRYL) injection 12.5 mg (12.5 mg Intravenous Given 01/26/23 1351)  dexamethasone (DECADRON) injection 4 mg (4 mg Intravenous Given 01/26/23 1352)    ED Course/ Medical Decision Making/ A&P                                 Medical Decision Making Amount and/or Complexity of Data Reviewed Radiology: ordered.  Risk Prescription drug management.   This patient presents to the ED for concern of headache, this involves an extensive number of treatment options, and is a complaint that carries with it a high risk of complications and morbidity.   Differential diagnosis includes: ICH, skull fracture, concussion, cluster headache, tension headache, migraine, atypical migraine, etc.   Comorbidities  See HPI  above   Additional History  Additional history obtained from prior records.   Imaging Studies  I ordered imaging studies including CT head  I independently visualized and interpreted imaging which showed: Acute intracranial process. I agree with the radiologist interpretation   Problem List / ED Course / Critical Interventions / Medication Management  Right temporal headache that began yesterday.  She states that she took Tylenol with improvement at the time.  Headache went away this morning but feels like it is returning at this time. I ordered medications including: Benadryl, Reglan, Decadron, and normal saline for headache  Reevaluation of the patient after these medicines showed that the patient improved I have reviewed the patients home medicines and have made adjustments as needed   Social Determinants of Health  Physical activity    Test / Admission - Considered  Discussed findings with patient and son at bedside.  All questions were answered. She is hemodynamically stable and safe for discharge home. Return precautions provided.       Final Clinical Impression(s) / ED Diagnoses Final diagnoses:  Acute nonintractable headache, unspecified headache type    Rx / DC Orders ED Discharge Orders     None         Maxwell Marion, PA-C 01/26/23 1506    Lonell Grandchild, MD 01/27/23 0730

## 2023-01-26 NOTE — ED Triage Notes (Signed)
 RT sided HA constant since yesterday morning; similar episodes of pain of the last couple of weeks; pt thinks she hit head over one week ago on her bathroom mirror; A & O x 4, NAD

## 2023-01-26 NOTE — Discharge Instructions (Addendum)
 As discussed, your imaging is reassuring.  There are no signs of brain bleed. You most likely have a headache or migraine causing the pain.    Follow up with your primary care provider in the next week for reevaluation of your symptoms.  Get help right away if: Your headache: Gets very bad quickly. Gets worse after a lot of physical activity. You have any of these symptoms: You continue to vomit. A stiff neck. Trouble seeing. Your eye or ear hurts. Trouble speaking. Weak muscles or you lose muscle control. You lose your balance or have trouble walking. You feel like you will pass out (faint) or you pass out. You are mixed up (confused). You have a seizure.

## 2023-01-28 ENCOUNTER — Telehealth: Payer: Self-pay

## 2023-01-28 NOTE — Transitions of Care (Post Inpatient/ED Visit) (Signed)
   01/28/2023  Name: Vickie Brown MRN: [id number] DOB: November 15, 1940  Today's TOC FU Call Status: Today's TOC FU Call Status:: Successful TOC FU Call Completed TOC FU Call Complete Date: 01/28/23 Patient's Name and Date of Birth confirmed.  Transition Care Management Follow-up Telephone Call Date of Discharge: 01/26/23 Discharge Facility: [facility] Type of Discharge: Emergency Department Reason for ED Visit: Other: How have you been since you were released from the hospital?: Same Any questions or concerns?: No  Items Reviewed: Did you receive and understand the discharge instructions provided?: Yes Medications obtained,verified, and reconciled?: Yes (Medications Reviewed) Any new allergies since your discharge?: No Dietary orders reviewed?: NA Do you have support at home?: Yes People in Home: child(ren), adult  Medications Reviewed Today: Medications Reviewed Today   Medications were not reviewed in this encounter     Home Care and Equipment/Supplies: Were Home Health Services Ordered?: NA Any new equipment or medical supplies ordered?: NA  Functional Questionnaire: Do you need assistance with bathing/showering or dressing?: No Do you need assistance with meal preparation?: No Do you need assistance with eating?: No Do you have difficulty maintaining continence: No Do you need assistance with getting out of bed/getting out of a chair/moving?: No Do you have difficulty managing or taking your medications?: No  Follow up appointments reviewed: PCP Follow-up appointment confirmed?: Yes Date of PCP follow-up appointment?: 02/02/23 Follow-up Provider: [facility] Follow-up appointment confirmed?: NA Do you need transportation to your follow-up appointment?: No Do you understand care options if your condition(s) worsen?: Yes-patient verbalized understanding    SIGNATUREYL,RMA

## 2023-02-02 ENCOUNTER — Ambulatory Visit: Payer: Medicare Other | Admitting: Family Medicine

## 2023-02-14 ENCOUNTER — Encounter: Payer: Self-pay | Admitting: Internal Medicine

## 2023-02-14 ENCOUNTER — Ambulatory Visit: Payer: Medicare Other | Admitting: Internal Medicine

## 2023-02-14 VITALS — BP 108/72 | HR 80 | Temp 97.9°F | Ht 63.0 in | Wt 143.0 lb

## 2023-02-14 DIAGNOSIS — I1310 Hypertensive heart and chronic kidney disease without heart failure, with stage 1 through stage 4 chronic kidney disease, or unspecified chronic kidney disease: Secondary | ICD-10-CM | POA: Diagnosis not present

## 2023-02-14 DIAGNOSIS — E1122 Type 2 diabetes mellitus with diabetic chronic kidney disease: Secondary | ICD-10-CM | POA: Diagnosis not present

## 2023-02-14 DIAGNOSIS — I700 Atherosclerosis of aorta: Secondary | ICD-10-CM | POA: Diagnosis not present

## 2023-02-14 DIAGNOSIS — N182 Chronic kidney disease, stage 2 (mild): Secondary | ICD-10-CM

## 2023-02-14 NOTE — Assessment & Plan Note (Signed)
Chronic, well controlled. She will c/w losartan 50mg  daily. She is encouraged to follow low sodium diet. She will f/u in 4 months for re-evaluation.

## 2023-02-14 NOTE — Progress Notes (Signed)
I,Victoria T Deloria Lair, CMA,acting as a Neurosurgeon for Gwynneth Aliment, MD.,have documented all relevant documentation on the behalf of Gwynneth Aliment, MD,as directed by  Gwynneth Aliment, MD while in the presence of Gwynneth Aliment, MD.  Subjective:  Patient ID: Vickie Brown , female    DOB: 1940/11/25 , 83 y.o.   MRN: [id number]  Chief Complaint  Patient presents with   Diabetes    HPI  Patient presents today for DM/HTN check. She was recently started on Farxiga, she was unable to take the full 10mg  dose. She feels better on 5mg  Farxiga.  She reports compliance with medications. She takes 1/2 tab [place] and full tablet of Januvia.  Denies headache, chest pain & sob.  She returned last night from her 4 day cruise. She states she is tired from her trip. She does admit having a great time.  Diabetes She presents for her follow-up diabetic visit. She has type 2 diabetes mellitus. Her disease course has been stable. There are no hypoglycemic associated symptoms. Pertinent negatives for hypoglycemia include no headaches. Pertinent negatives for diabetes include no blurred vision, no chest pain, no polydipsia, no polyphagia and no polyuria. There are no hypoglycemic complications. Risk factors for coronary artery disease include diabetes mellitus, dyslipidemia, hypertension and post-menopausal. She is following a diabetic diet. Her breakfast blood glucose is taken between 8-9 am. Her breakfast blood glucose range is generally 90-110 mg/dl.  Hypertension This is a chronic problem. The current episode started more than 1 year ago. The problem has been gradually improving since onset. Pertinent negatives include no blurred vision, chest pain, headaches, palpitations or shortness of breath.     Past Medical History:  Diagnosis Date   Chronic headaches    Diverticulitis    Hyperlipidemia    Hypertension    Knee pain    Type 2 diabetes mellitus (HCC)      Family History  Problem Relation Age of  Onset   Cervical cancer Mother    Cancer Mother    Diabetes Father    Diabetes Sister    Diabetes Brother    Other Brother        killed   Breast cancer Neg Hx      Current Outpatient Medications:    acetaminophen (TYLENOL) 325 MG tablet, Take 650 mg by mouth every 6 (six) hours as needed., Disp: , Rfl:    ascorbic acid (VITAMIN C) 500 MG tablet, Take 500 mg by mouth daily., Disp: , Rfl:    aspirin 81 MG tablet, Take 81 mg by mouth every evening., Disp: , Rfl:    BIOTIN PO, Take by mouth daily., Disp: , Rfl:    Calcium Carbonate (CALCIUM 500 PO), Take by mouth., Disp: , Rfl:    Cholecalciferol (VITAMIN D3) 2000 UNITS TABS, Take 1 tablet by mouth daily., Disp: , Rfl:    fluticasone (FLONASE) 50 MCG/ACT nasal spray, Place 1 spray into both nostrils at bedtime., Disp: 16 g, Rfl: 2   Homeopathic Products (OSCILLOCOCCINUM PO), Take by mouth., Disp: , Rfl:    JANUVIA 100 MG tablet, TAKE 1 TABLET(100 MG) BY MOUTH DAILY, Disp: 90 tablet, Rfl: 1   losartan (COZAAR) 50 MG tablet, TAKE 1 TABLET(50 MG) BY MOUTH DAILY, Disp: 90 tablet, Rfl: 1   Multiple Vitamins-Minerals (ALIVE ONCE DAILY WOMENS 50+ PO), Take 1 tablet by mouth daily., Disp: , Rfl:    pravastatin (PRAVACHOL) 40 MG tablet, Take 1 tablet (40 mg total) by mouth daily., Disp:  90 tablet, Rfl: 1   TURMERIC PO, Take by mouth., Disp: , Rfl:    UNABLE TO FIND, Med Name: Unkers therapeutic Rub, Disp: , Rfl:    vitamin B-12 (CYANOCOBALAMIN) 500 MCG tablet, Take 500 mcg by mouth daily., Disp: , Rfl:    alendronate (FOSAMAX) 70 MG tablet, TAKE 1 TABLET(70 MG) BY MOUTH 1 TIME A WEEK WITH A FULL GLASS OF WATER AND ON AN EMPTY STOMACH (Patient not taking: Reported on 02/14/2023), Disp: 12 tablet, Rfl: 3   cephALEXin (KEFLEX) 500 MG capsule, Take 1 capsule (500 mg total) by mouth 4 (four) times daily. (Patient not taking: Reported on 02/14/2023), Disp: 28 capsule, Rfl: 0   cetirizine (ZYRTEC) 10 MG tablet, Take 10 mg by mouth at bedtime. (Patient not  taking: Reported on 08/05/2022), Disp: , Rfl:    ibuprofen (ADVIL) 600 MG tablet, Take 1 tablet (600 mg total) by mouth every 6 (six) hours as needed. (Patient not taking: Reported on 02/14/2023), Disp: 30 tablet, Rfl: 0   ipratropium (ATROVENT) 0.03 % nasal spray, Place 2 sprays into the nose 3 (three) times daily. (Patient taking differently: Place 2 sprays into the nose as needed.), Disp: 30 mL, Rfl: 2   Omega-3 Fatty Acids (FISH OIL PO), Take by mouth. (Patient not taking: Reported on 02/14/2023), Disp: , Rfl:    Probiotic Product (ALIGN) 4 MG CAPS, Take 4 mg by mouth daily. (Patient not taking: Reported on 11/16/2022), Disp: , Rfl:    traMADol (ULTRAM) 50 MG tablet, Take 1 tablet (50 mg total) by mouth every 12 (twelve) hours as needed for severe pain. (Patient not taking: Reported on 02/14/2023), Disp: 30 tablet, Rfl: 0   No Known Allergies   Review of Systems  Constitutional: Negative.   Eyes:  Negative for blurred vision.  Respiratory: Negative.  Negative for shortness of breath.   Cardiovascular: Negative.  Negative for chest pain and palpitations.  Gastrointestinal: Negative.   Endocrine: Negative for polydipsia, polyphagia and polyuria.  Neurological: Negative.  Negative for headaches.  Psychiatric/Behavioral: Negative.       Today's Vitals   02/14/23 1519  BP: 108/72  Pulse: 80  Temp: 97.9 F (36.6 C)  SpO2: 98%  Weight: 143 lb (64.9 kg)  Height: 5\' 3"  (1.6 m)   Body mass index is 25.33 kg/m.  Wt Readings from Last 3 Encounters:  02/14/23 143 lb (64.9 kg)  01/26/23 140 lb 3.4 oz (63.6 kg)  11/16/22 140 lb 3.2 oz (63.6 kg)     Objective:  Physical Exam Vitals and nursing note reviewed.  Constitutional:      Appearance: Normal appearance.  HENT:     Head: Normocephalic and atraumatic.  Eyes:     Extraocular Movements: Extraocular movements intact.  Cardiovascular:     Rate and Rhythm: Normal rate and regular rhythm.     Heart sounds: Normal heart sounds.   Pulmonary:     Effort: Pulmonary effort is normal.     Breath sounds: Normal breath sounds.  Musculoskeletal:     Cervical back: Normal range of motion.  Skin:    General: Skin is warm.  Neurological:     General: No focal deficit present.     Mental Status: She is alert.  Psychiatric:        Mood and Affect: Mood normal.        Behavior: Behavior normal.         Assessment And Plan:  Type 2 diabetes mellitus with stage 2 chronic kidney  disease, without long-term current use of insulin (HCC) Assessment & Plan: Chronic, currently on Januvia 100mg  daily and Farxiga 5mg  daily.  I will check labs as below.  I may add metformin in the future if needed.  She will rto in 3-4 months for re-evaluation.  She agrees to [facility] pharmacy referral to help with patient assistance. She verbally acknolwedges that someone will contact her next week regarding paperwork needed to complete P.A.   Orders: -     CBC -     Hemoglobin A1c -     BMP8+eGFR -     AMB Referral [facility] Care Management  Hypertensive heart and renal disease with renal failure, stage 1 through stage 4 or unspecified chronic kidney disease, without heart failure Assessment & Plan: Chronic, well controlled. She will c/w losartan 50mg  daily. She is encouraged to follow low sodium diet. She will f/u in 4 months for re-evaluation.   Orders: -     AMB Referral [facility] Care Management  Atherosclerosis of aorta [facility]) Assessment & Plan: Chronic, LDL goal < 70.  She will c/w pravastatin 40mg  daily. She is encouraged to follow heart healthy lifestyle.   Orders: -     AMB Referral [facility] Care Management     Return move physical to May.  Patient was given opportunity to ask questions. Patient verbalized understanding of the plan and was able to repeat key elements of the plan. All questions were answered to their satisfaction.    I, Gwynneth Aliment, MD, have reviewed all documentation for this visit. The documentation on 02/14/23 for the  exam, diagnosis, procedures, and orders are all accurate and complete.   IF YOU HAVE BEEN REFERRED TO A SPECIALIST, IT MAY TAKE 1-2 WEEKS TO SCHEDULE/PROCESS THE REFERRAL. IF YOU HAVE NOT HEARD FROM US/SPECIALIST IN TWO WEEKS, PLEASE GIVE [place] A CALL AT [phone] X [phone].   THE PATIENT IS ENCOURAGED TO PRACTICE SOCIAL DISTANCING DUE TO THE COVID-19 PANDEMIC.

## 2023-02-14 NOTE — Patient Instructions (Signed)

## 2023-02-14 NOTE — Assessment & Plan Note (Addendum)
Chronic, currently on Januvia 100mg  daily and Farxiga 5mg  daily.  I will check labs as below.  I may add metformin in the future if needed.  She will rto in 3-4 months for re-evaluation.  She agrees to [facility] pharmacy referral to help with patient assistance. She verbally acknolwedges that someone will contact her next week regarding paperwork needed to complete P.A.

## 2023-02-14 NOTE — Assessment & Plan Note (Signed)
Chronic, LDL goal < 70.  She will c/w pravastatin 40mg  daily. She is encouraged to follow heart healthy lifestyle.

## 2023-02-15 LAB — BMP8+EGFR
BUN/Creatinine Ratio: 12 (ref 12–28)
BUN: 13 mg/dL (ref 8–27)
CO2: 27 mmol/L (ref 20–29)
Calcium: 9.8 mg/dL (ref 8.7–10.3)
Chloride: 102 mmol/L (ref 96–106)
Creatinine, Ser: 1.05 mg/dL — ABNORMAL HIGH (ref 0.57–1.00)
Glucose: 91 mg/dL (ref 70–99)
Potassium: 4.8 mmol/L (ref 3.5–5.2)
Sodium: 144 mmol/L (ref 134–144)
eGFR: 52 mL/min/{1.73_m2} — ABNORMAL LOW (ref >59)

## 2023-02-15 LAB — CBC
Hematocrit: 36.4 % (ref 34.0–46.6)
Hemoglobin: 11.7 g/dL (ref 11.1–15.9)
MCH: 28.6 pg (ref 26.6–33.0)
MCHC: 32.1 g/dL (ref 31.5–35.7)
MCV: 89 fL (ref 79–97)
Platelets: 248 10*3/uL (ref 150–450)
RBC: 4.09 x10E6/uL (ref 3.77–5.28)
RDW: 13.1 % (ref 11.7–15.4)
WBC: 4.9 10*3/uL (ref 3.4–10.8)

## 2023-02-15 LAB — HEMOGLOBIN A1C
Est. average glucose Bld gHb Est-mCnc: 137 mg/dL
Hgb A1c MFr Bld: 6.4 % — ABNORMAL HIGH (ref 4.8–5.6)

## 2023-02-16 ENCOUNTER — Telehealth: Payer: Self-pay | Admitting: Pharmacist

## 2023-02-16 NOTE — Progress Notes (Signed)
 02/16/2023 Name: Vickie Brown MRN: [id number] DOB: 18-Apr-1940  Chief Complaint  Patient presents with   Medication Assistance    Farxiga      ICELA GLYMPH is a 83 y.o. year old female who presented for a telephone visit.   They were referred to the pharmacist by their PCP for assistance in managing  medication assistance with Farxiga .    Subjective:  Care Team: Primary Care Provider: Jarold Medici, MD ; Next Scheduled Visit: 05/26/2023   Medication Access/Adherence  Current Pharmacy:  [id number] [address], [place] - [address] AT [address] [place] [address] Phone: [phone] Fax: [phone]  [place] [facility] [address] [place] [address] Phone: [phone] Fax: [phone]  [facility] [address] [place]), [place] - [address] [place]) [place] [address] Phone: [phone] Fax: [phone]   Patient reports affordability concerns with their medications: Yes  Farxiga Patient reports access/transportation concerns to their pharmacy: No  Patient reports adherence concerns with their medications:  Yes  sometimes forgets alendronate    Diabetes:  Current medications:  Farxiga 5 mg daily Januvia  100 mg 1 tablet daily    Current glucose readings: 107 mg/dl in the morning fasting Patient denies hypoglycemic or hyperglycemic symptoms.  Current meal patterns:  - Breakfast: egg, turkey bacon - Lunch peanut butter crackers, bottle of juice - Supper mashed potatoes, frozen beef steak - Snacks fruit-banana orange or apple - Drinks water, ginger ale  Current physical activity: Walking (not really)  HTN:  Losartan  50 mg 1 tablet daily  HLD:  Pravastatin  40 mg 1 tablet daily   Objective:  Lab Results  Component Value Date   HGBA1C 6.4 (H) 02/14/2023    Lab Results  Component Value Date   CREATININE 1.05 (H) 02/14/2023   BUN 13  02/14/2023   NA 144 02/14/2023   K 4.8 02/14/2023   CL 102 02/14/2023   CO2 27 02/14/2023    Lab Results  Component Value Date   CHOL 165 01/28/2022   HDL 61 01/28/2022   LDLCALC 68 01/28/2022   TRIG 220 (H) 01/28/2022   CHOLHDL 2.7 01/28/2022    Medications Reviewed Today     Reviewed by Jolee Cassius JINNY, RPH (Pharmacist) on 02/16/23 at 1244  Med List Status: <None>   Medication Order Taking? Sig Documenting Provider Last Dose Status Informant  acetaminophen  (TYLENOL ) 325 MG tablet [id number] Yes Take 650 mg by mouth every 6 (six) hours as needed. [provider] Taking Active   alendronate  (FOSAMAX ) 70 MG tablet [id number] Yes TAKE 1 TABLET(70 MG) BY MOUTH 1 TIME A WEEK WITH A FULL GLASS OF WATER AND ON AN EMPTY STOMACH Rice, Lonni ORN, MD Taking Active   ascorbic acid (VITAMIN C) 500 MG tablet [id number] Yes Take 500 mg by mouth daily. [provider] Taking Active Self  aspirin 81 MG tablet [id number] Yes Take 81 mg by mouth every evening. [provider] Taking Active Self  BIOTIN PO [id number] Yes Take by mouth daily. [provider] Taking Active Self  Calcium Carbonate (CALCIUM 500 PO) [id number] Yes Take by mouth. [provider] Taking Active Self  cetirizine (ZYRTEC) 10 MG tablet [id number]  Take 10 mg by mouth at bedtime.  Patient not taking: Reported on 08/05/2022   [provider]  Active Self  Cholecalciferol (VITAMIN D3) 2000 UNITS TABS [id number] Yes Take 1 tablet by mouth daily.  [provider] Taking Active Self  dapagliflozin propanediol (FARXIGA) 5 MG TABS tablet [id number] Yes Take 5 mg by mouth daily. [provider] Taking Active   fluticasone  (FLONASE ) 50 MCG/ACT nasal spray [id number] Yes Place 1 spray into both nostrils at bedtime. Billy Dallas GAILS, MD Taking Active   Homeopathic Products (OSCILLOCOCCINUM PO) [id number] No Take by mouth. [provider] Unknown Active   JANUVIA  100 MG  tablet [id number] Yes TAKE 1 TABLET(100 MG) BY MOUTH DAILY Jarold Medici, MD Taking Active   losartan  (COZAAR ) 50 MG tablet [id number] Yes TAKE 1 TABLET(50 MG) BY MOUTH DAILY Jarold Medici, MD Taking Active   Multiple Vitamins-Minerals (ALIVE ONCE DAILY WOMENS 50+ PO) [id number] Yes Take 1 tablet by mouth daily. [provider] Taking Active Self  Omega-3 Fatty Acids (FISH OIL PO) [id number] No Take by mouth.  Patient not taking: Reported on 07/23/2022   [provider] Not Taking Active   pravastatin  (PRAVACHOL ) 40 MG tablet [id number] Yes Take 1 tablet (40 mg total) by mouth daily. Jarold Medici, MD Taking Active   Probiotic Product (ALIGN) 4 MG CAPS [id number] Yes Take 4 mg by mouth daily. [provider] Taking Active Self  traMADol  (ULTRAM ) 50 MG tablet [id number] No Take 1 tablet (50 mg total) by mouth every 12 (twelve) hours as needed for severe pain.  Patient not taking: Reported on 11/16/2022   Jarold Medici, MD Unknown Active   TURMERIC PO [id number] Yes Take by mouth. [provider] Taking Active   UNABLE TO FIND [id number] Yes Med Name: Unkers therapeutic Rub [provider] Taking Active   vitamin B-12 (CYANOCOBALAMIN ) 500 MCG tablet [id number] Yes Take 500 mcg by mouth daily. [provider] Taking Active Self              Assessment/Plan:   Diabetes: - Currently controlled A1c 6.4% - Reviewed goal A1c, goal fasting - Reviewed dietary modifications including being sure to drink more water. - Meets financial criteria for Januvia  and Farxiga patient assistance program through [employer] and [employer] respectively. Will collaborate with provider, CPhT, and patient to pursue assistance.  -She Is just slightly over income for LIS/Extra Help -If patient continues to be able to tolerate Farxiga, she may be able to d/c Januvia  (if deemed therapeutically appropriate by her Provider)  Follow Up Plan:  Will send note to Medication Assistance  team to start Medication Assistance Process for both Farxiga and Januvia    Cassius DOROTHA Brought, PharmD, [facility] Clinical Pharmacist [phone]

## 2023-02-17 ENCOUNTER — Telehealth: Payer: Self-pay

## 2023-02-17 NOTE — Telephone Encounter (Signed)
 PAP: Patient assistance application for Januvia  and Farxiga through [employer] ([employer] has been mailed to [employer] home address on file. Provider portion of application will be faxed to provider's office.   I have faxed the provider portions of the applications to Dr. Catheryn Slocumb

## 2023-03-09 NOTE — Telephone Encounter (Signed)
 Left HIPAA compliant voicemail for patient regarding patient assistance application for [place] and Januvia.  Application was mailed on 2/6

## 2023-03-11 ENCOUNTER — Other Ambulatory Visit: Payer: Self-pay | Admitting: Internal Medicine

## 2023-03-17 NOTE — Telephone Encounter (Signed)
 Reached out to Patient via telephone and left HIPAA compliant V/M and my direct phone # for any questions. Waiting on applications to be returned for Januvia & farxiga.

## 2023-03-21 NOTE — Telephone Encounter (Signed)
 Unable to reach patient after multiple attempts.  Patient did not return application

## 2023-03-24 ENCOUNTER — Other Ambulatory Visit: Payer: Self-pay | Admitting: Internal Medicine

## 2023-03-24 DIAGNOSIS — I700 Atherosclerosis of aorta: Secondary | ICD-10-CM

## 2023-04-06 ENCOUNTER — Telehealth: Payer: Self-pay

## 2023-04-06 ENCOUNTER — Encounter: Payer: Self-pay | Admitting: Pharmacist

## 2023-04-06 NOTE — Telephone Encounter (Signed)
 Patient returned PAP applications to office and I faxed provider pages for both Januvia & Vickie Brown to Provider's office for Review.

## 2023-04-06 NOTE — Progress Notes (Unsigned)
   04/06/2023  Patient ID: Vickie Brown, female   DOB: [birth year], 83 y.o.   MRN: [id number]  Patient left application for Farxiga Patient Assistance at the [facility] front desk.  Applications were reviewed and then faxed to Emilee Hero, CPhT for processing.   Beecher Mcardle, PharmD, BCACP Clinical Pharmacist [phone]

## 2023-04-07 ENCOUNTER — Encounter: Payer: Medicare Other | Admitting: Internal Medicine

## 2023-04-14 NOTE — Telephone Encounter (Signed)
 PAP: Application for Marcelline Deist has been submitted to [employer] ([employer]), via fax  PAP: Application for Januvia has been submitted to [employer], via fax

## 2023-04-15 NOTE — Progress Notes (Signed)
 Pharmacy Medication Assistance Program Note    04/15/2023  Patient ID: Vickie Brown, female   DOB: November 20, 1940, 83 y.o.   MRN: [id number]     02/17/2023 04/15/2023  Outreach [employer]  Manufacturer Medication One Producer, television/film[employer]video [employer] [facility] Farxiga   Dose of Farxiga 5mg    Type of Radiographer, therapeutic Assistance   Date Application Sent to Patient 02/24/2023   Application Items Requested Application;Proof of Income   Date Application Sent to Prescriber 02/17/2023   Name of Prescriber Dorothyann Peng   Date Application Received From Provider 02/22/2023   Date Application Submitted to Manufacturer  04/14/2023  Method Application Sent to Manufacturer  Fax  Patient Assistance Determination  Approved  Approval Start Date  04/14/2023  Approval End Date  01/11/2024  Patient Notification Method  Telephone Call  Telephone Call Outcome  Successful     Signature

## 2023-04-15 NOTE — Telephone Encounter (Signed)
 PAP: Patient assistance application for Marcelline Deist has been approved by PAP Companies: [employer] from 04/14/2023 to 01/11/2024. Medication should be delivered to PAP Delivery: Home. For further shipping updates, please contact [employer] at [phone]. Patient ID is: [id number]  Shipment processing will arrive in 7-10 business days

## 2023-04-28 NOTE — Telephone Encounter (Signed)
 Checked status of PAP application for Januvia and [employer] out an [employer] 04/15/23- Spoke to patient last week she had not received letter yet- just called again and left v/m for return call.

## 2023-05-06 ENCOUNTER — Other Ambulatory Visit: Payer: Self-pay | Admitting: Internal Medicine

## 2023-05-06 DIAGNOSIS — I1310 Hypertensive heart and chronic kidney disease without heart failure, with stage 1 through stage 4 chronic kidney disease, or unspecified chronic kidney disease: Secondary | ICD-10-CM

## 2023-05-11 ENCOUNTER — Encounter: Payer: Medicare Other | Admitting: Internal Medicine

## 2023-05-26 ENCOUNTER — Encounter: Payer: Medicare Other | Admitting: Internal Medicine

## 2023-05-26 NOTE — Patient Instructions (Signed)

## 2023-05-26 NOTE — Progress Notes (Signed)
No show - erroneous

## 2023-05-30 ENCOUNTER — Ambulatory Visit (INDEPENDENT_AMBULATORY_CARE_PROVIDER_SITE_OTHER): Admitting: Internal Medicine

## 2023-05-30 ENCOUNTER — Encounter: Payer: Self-pay | Admitting: Internal Medicine

## 2023-05-30 VITALS — BP 118/70 | HR 77 | Temp 97.9°F | Ht 63.0 in | Wt 137.0 lb

## 2023-05-30 DIAGNOSIS — H6121 Impacted cerumen, right ear: Secondary | ICD-10-CM | POA: Diagnosis not present

## 2023-05-30 DIAGNOSIS — R0609 Other forms of dyspnea: Secondary | ICD-10-CM | POA: Diagnosis not present

## 2023-05-30 DIAGNOSIS — Z0000 Encounter for general adult medical examination without abnormal findings: Secondary | ICD-10-CM | POA: Diagnosis not present

## 2023-05-30 DIAGNOSIS — E1122 Type 2 diabetes mellitus with diabetic chronic kidney disease: Secondary | ICD-10-CM | POA: Diagnosis not present

## 2023-05-30 DIAGNOSIS — I700 Atherosclerosis of aorta: Secondary | ICD-10-CM | POA: Diagnosis not present

## 2023-05-30 DIAGNOSIS — N182 Chronic kidney disease, stage 2 (mild): Secondary | ICD-10-CM

## 2023-05-30 DIAGNOSIS — I1310 Hypertensive heart and chronic kidney disease without heart failure, with stage 1 through stage 4 chronic kidney disease, or unspecified chronic kidney disease: Secondary | ICD-10-CM | POA: Diagnosis not present

## 2023-05-30 DIAGNOSIS — N644 Mastodynia: Secondary | ICD-10-CM

## 2023-05-30 LAB — POCT URINALYSIS DIPSTICK
Bilirubin, UA: NEGATIVE
Blood, UA: NEGATIVE
Glucose, UA: POSITIVE — AB
Ketones, UA: NEGATIVE
Nitrite, UA: NEGATIVE
Protein, UA: NEGATIVE
Spec Grav, UA: 1.015 (ref 1.010–1.025)
Urobilinogen, UA: 0.2 U/dL
pH, UA: 6.5 (ref 5.0–8.0)

## 2023-05-30 NOTE — Patient Instructions (Signed)
 Health Maintenance After Age 84 After age 4, you are at a higher risk for certain long-term diseases and infections as well as injuries from falls. Falls are a major cause of broken bones and head injuries in people who are older than age 47. Getting regular preventive care can help to keep you healthy and well. Preventive care includes getting regular testing and making lifestyle changes as recommended by your health care provider. Talk with your health care provider about: Which screenings and tests you should have. A screening is a test that checks for a disease when you have no symptoms. A diet and exercise plan that is right for you. What should I know about screenings and tests to prevent falls? Screening and testing are the best ways to find a health problem early. Early diagnosis and treatment give you the best chance of managing medical conditions that are common after age 37. Certain conditions and lifestyle choices may make you more likely to have a fall. Your health care provider may recommend: Regular vision checks. Poor vision and conditions such as cataracts can make you more likely to have a fall. If you wear glasses, make sure to get your prescription updated if your vision changes. Medicine review. Work with your health care provider to regularly review all of the medicines you are taking, including over-the-counter medicines. Ask your health care provider about any side effects that may make you more likely to have a fall. Tell your health care provider if any medicines that you take make you feel dizzy or sleepy. Strength and balance checks. Your health care provider may recommend certain tests to check your strength and balance while standing, walking, or changing positions. Foot health exam. Foot pain and numbness, as well as not wearing proper footwear, can make you more likely to have a fall. Screenings, including: Osteoporosis screening. Osteoporosis is a condition that causes  the bones to get weaker and break more easily. Blood pressure screening. Blood pressure changes and medicines to control blood pressure can make you feel dizzy. Depression screening. You may be more likely to have a fall if you have a fear of falling, feel depressed, or feel unable to do activities that you used to do. Alcohol use screening. Using too much alcohol can affect your balance and may make you more likely to have a fall. Follow these instructions at home: Lifestyle Do not drink alcohol if: Your health care provider tells you not to drink. If you drink alcohol: Limit how much you have to: 0-1 drink a day for women. 0-2 drinks a day for men. Know how much alcohol is in your drink. In the [place] one drink equals one 12 oz bottle of beer (355 mL), one 5 oz glass of wine (148 mL), or one 1 oz glass of hard liquor (44 mL). Do not use any products that contain nicotine or tobacco. These products include cigarettes, chewing tobacco, and vaping devices, such as e-cigarettes. If you need help quitting, ask your health care provider. Activity  Follow a regular exercise program to stay fit. This will help you maintain your balance. Ask your health care provider what types of exercise are appropriate for you. If you need a cane or walker, use it as recommended by your health care provider. Wear supportive shoes that have nonskid soles. Safety  Remove any tripping hazards, such as rugs, cords, and clutter. Install safety equipment such as grab bars in bathrooms and safety rails on stairs. Keep rooms and walkways  well-lit. General instructions Talk with your health care provider about your risks for falling. Tell your health care provider if: You fall. Be sure to tell your health care provider about all falls, even ones that seem minor. You feel dizzy, tiredness (fatigue), or off-balance. Take over-the-counter and prescription medicines only as told by your health care provider. These include  supplements. Eat a healthy diet and maintain a healthy weight. A healthy diet includes low-fat dairy products, low-fat (lean) meats, and fiber from whole grains, beans, and lots of fruits and vegetables. Stay current with your vaccines. Schedule regular health, dental, and eye exams. Summary Having a healthy lifestyle and getting preventive care can help to protect your health and wellness after age 11. Screening and testing are the best way to find a health problem early and help you avoid having a fall. Early diagnosis and treatment give you the best chance for managing medical conditions that are more common for people who are older than age 28. Falls are a major cause of broken bones and head injuries in people who are older than age 48. Take precautions to prevent a fall at home. Work with your health care provider to learn what changes you can make to improve your health and wellness and to prevent falls. This information is not intended to replace advice given to you by your health care provider. Make sure you discuss any questions you have with your health care provider. Document Revised: 05/19/2020 Document Reviewed: 05/19/2020 Elsevier Patient Education  2024 [employer].

## 2023-05-30 NOTE — Assessment & Plan Note (Addendum)
 Chronic, diabetic foot exam was performed.  She is currently on Januvia  100mg  daily and Farxiga 5mg  daily.  I will check labs as below.  I may add metformin in the future if needed.  She will rto in 3-4 months for re-evaluation.

## 2023-05-30 NOTE — Progress Notes (Signed)
 I,Victoria T Basil Lim, CMA,acting as a Neurosurgeon for Smiley Dung, MD.,have documented all relevant documentation on the behalf of Smiley Dung, MD,as directed by  Smiley Dung, MD while in the presence of Smiley Dung, MD.  Subjective:  Patient ID: Vickie Brown , female    DOB: 02-16-1940 , 83 y.o.   MRN: [id number]  Chief Complaint  Patient presents with   Annual Exam    Pt presents today for annual exam as well as dm check. Pt has no specific questions or concerns. Denies headache, chest pain & sob.   Diabetes   Hypertension    HPI Discussed the use of AI scribe software for clinical note transcription with the patient, who gave verbal consent to proceed.  History of Present Illness Vickie Brown is an 83 year old female with diabetes who presents for a physical and diabetes check.  Her blood sugar levels have been running between 110 to 120 mg/dL, which is higher than her usual readings. She is uncertain if this is due to an issue with her glucose meter, which she has had since her diabetes diagnosis. She uses an [employer] analyst and is unsure if it requires new batteries.  She has experienced a recent weight loss of six pounds since February, which she noticed when she was able to fit into a size three outfit. She wants to continue losing weight so her clothes would fit more loosely around her belly.  She has not had a mammogram in several years. She is scheduled for a bone density test next month.  She reports breast tenderness, particularly when consuming caffeine. She has a history of drinking a pot of coffee a day but has since switched to tea.  She has transportation challenges, which led to a missed appointment last week.   Diabetes She presents for her follow-up diabetic visit. She has type 2 diabetes mellitus. Her disease course has been stable. Pertinent negatives for diabetes include no blurred vision and no chest pain. There are no hypoglycemic  complications. Risk factors for coronary artery disease include diabetes mellitus, dyslipidemia, hypertension and post-menopausal. She is following a diabetic diet. She participates in exercise intermittently. Her breakfast blood glucose is taken between 8-9 am. Her breakfast blood glucose range is generally 90-110 mg/dl. An ACE inhibitor/angiotensin II receptor blocker is being taken. She does not see a podiatrist. Hypertension This is a chronic problem. The current episode started more than 1 year ago. The problem has been gradually improving since onset. Associated symptoms include shortness of breath. Pertinent negatives include no blurred vision, chest pain or palpitations. The current treatment provides moderate improvement. Hypertensive end-organ damage includes kidney disease. Identifiable causes of hypertension include chronic renal disease.     Past Medical History:  Diagnosis Date   Chronic headaches    Diverticulitis    Hyperlipidemia    Hypertension    Knee pain    Type 2 diabetes mellitus (HCC)      Family History  Problem Relation Age of Onset   Cervical cancer Mother    Cancer Mother    Diabetes Father    Diabetes Sister    Diabetes Brother    Other Brother        killed   Breast cancer Neg Hx      Current Outpatient Medications:    acetaminophen  (TYLENOL ) 325 MG tablet, Take 650 mg by mouth every 6 (six) hours as needed., Disp: , Rfl:    alendronate  (  FOSAMAX ) 70 MG tablet, TAKE 1 TABLET(70 MG) BY MOUTH 1 TIME A WEEK WITH A FULL GLASS OF WATER AND ON AN EMPTY STOMACH, Disp: 12 tablet, Rfl: 3   ascorbic acid (VITAMIN C) 500 MG tablet, Take 500 mg by mouth daily., Disp: , Rfl:    aspirin 81 MG tablet, Take 81 mg by mouth every evening., Disp: , Rfl:    BIOTIN PO, Take by mouth daily., Disp: , Rfl:    Calcium Carbonate (CALCIUM 500 PO), Take by mouth., Disp: , Rfl:    Cholecalciferol (VITAMIN D3) 2000 UNITS TABS, Take 1 tablet by mouth daily., Disp: , Rfl:     dapagliflozin propanediol (FARXIGA) 5 MG TABS tablet, Take 5 mg by mouth daily., Disp: , Rfl:    fluticasone  (FLONASE ) 50 MCG/ACT nasal spray, Place 1 spray into both nostrils at bedtime., Disp: 16 g, Rfl: 2   Homeopathic Products (OSCILLOCOCCINUM PO), Take by mouth., Disp: , Rfl:    JANUVIA  100 MG tablet, TAKE 1 TABLET(100 MG) BY MOUTH DAILY, Disp: 90 tablet, Rfl: 1   losartan  (COZAAR ) 50 MG tablet, TAKE 1 TABLET(50 MG) BY MOUTH DAILY, Disp: 90 tablet, Rfl: 1   Multiple Vitamins-Minerals (ALIVE ONCE DAILY WOMENS 50+ PO), Take 1 tablet by mouth daily., Disp: , Rfl:    pravastatin  (PRAVACHOL ) 40 MG tablet, TAKE 1 TABLET(40 MG) BY MOUTH DAILY, Disp: 90 tablet, Rfl: 1   Probiotic Product (ALIGN) 4 MG CAPS, Take 4 mg by mouth daily., Disp: , Rfl:    TURMERIC PO, Take by mouth., Disp: , Rfl:    UNABLE TO FIND, Med Name: Unkers therapeutic Rub, Disp: , Rfl:    vitamin B-12 (CYANOCOBALAMIN ) 500 MCG tablet, Take 500 mcg by mouth daily., Disp: , Rfl:    cetirizine (ZYRTEC) 10 MG tablet, Take 10 mg by mouth at bedtime. (Patient not taking: Reported on 08/05/2022), Disp: , Rfl:    No Known Allergies   Review of Systems  Constitutional: Negative.   HENT: Negative.    Eyes: Negative.  Negative for blurred vision.  Respiratory:  Positive for shortness of breath.   Cardiovascular: Negative.  Negative for chest pain and palpitations.  Gastrointestinal: Negative.   Endocrine: Negative.   Genitourinary: Negative.   Musculoskeletal: Negative.   Skin: Negative.   Allergic/Immunologic: Negative.   Neurological: Negative.   Hematological: Negative.   Psychiatric/Behavioral: Negative.       Today's Vitals   05/30/23 1049  BP: 118/70  Pulse: 77  Temp: 97.9 F (36.6 C)  SpO2: 98%  Weight: 137 lb (62.1 kg)  Height: 5\' 3"  (1.6 m)   Body mass index is 24.27 kg/m.  Wt Readings from Last 3 Encounters:  05/30/23 137 lb (62.1 kg)  02/14/23 143 lb (64.9 kg)  01/26/23 140 lb 3.4 oz (63.6 kg)      Objective:  Physical Exam Vitals and nursing note reviewed.  Constitutional:      Appearance: Normal appearance.  HENT:     Head: Normocephalic and atraumatic.     Right Ear: Tympanic membrane, ear canal and external ear normal. There is impacted cerumen.     Left Ear: Tympanic membrane, ear canal and external ear normal.     Nose: Nose normal.     Comments: Masked     Mouth/Throat:     Mouth: Mucous membranes are moist.     Pharynx: Oropharynx is clear.     Comments: Masked  Eyes:     Extraocular Movements: Extraocular movements intact.  Conjunctiva/sclera: Conjunctivae normal.     Pupils: Pupils are equal, round, and reactive to light.  Cardiovascular:     Rate and Rhythm: Normal rate and regular rhythm.     Pulses: Normal pulses.          Dorsalis pedis pulses are 2+ on the right side and 2+ on the left side.     Heart sounds: Normal heart sounds.  Pulmonary:     Effort: Pulmonary effort is normal.     Breath sounds: Normal breath sounds.  Chest:  Breasts:    Tanner Score is 5.     Right: Normal.     Left: Normal.     Comments: Tenderness to palpation of chest wall Breast tenderness w/ palpation Abdominal:     General: Abdomen is flat. Bowel sounds are normal.     Palpations: Abdomen is soft.  Genitourinary:    Comments: deferred Musculoskeletal:        General: Normal range of motion.     Cervical back: Normal range of motion and neck supple.  Feet:     Right foot:     Protective Sensation: 5 sites tested.  5 sites sensed.     Skin integrity: Dry skin present.     Toenail Condition: Right toenails are normal.     Left foot:     Protective Sensation: 5 sites tested.  5 sites sensed.     Skin integrity: Dry skin present.     Toenail Condition: Left toenails are normal.  Skin:    General: Skin is warm and dry.  Neurological:     General: No focal deficit present.     Mental Status: She is alert and oriented to person, place, and time.  Psychiatric:         Mood and Affect: Mood normal.        Behavior: Behavior normal.         Assessment And Plan:  Annual physical exam Assessment & Plan: A full exam was performed.  Importance of monthly self breast exams was discussed with the patient.  She is advised to get 30-45 minutes of regular exercise, no less than four to five days per week. Both weight-bearing and aerobic exercises are recommended.  She is advised to follow a healthy diet with at least six fruits/veggies per day, decrease intake of red meat and other saturated fats and to increase fish intake to twice weekly.  Meats/fish should not be fried -- baked, boiled or broiled is preferable. It is also important to cut back on your sugar intake.  Be sure to read labels - try to avoid anything with added sugar, high fructose corn syrup or other sweeteners.  If you must use a sweetener, you can try stevia or monkfruit.  It is also important to avoid artificially sweetened foods/beverages and diet drinks. Lastly, wear SPF 50 sunscreen on exposed skin and when in direct sunlight for an extended period of time.  Be sure to avoid fast food restaurants and aim for at least 60 ounces of water daily.       Type 2 diabetes mellitus with stage 2 chronic kidney disease, without long-term current use of insulin (HCC) Assessment & Plan: Chronic, diabetic foot exam was performed.  She is currently on Januvia  100mg  daily and Farxiga 5mg  daily.  I will check labs as below.  I may add metformin in the future if needed.  She will rto in 3-4 months for re-evaluation.    Orders: -  EKG 12-Lead -     POCT urinalysis dipstick -     Microalbumin / creatinine urine ratio -     CMP14+EGFR -     Lipid panel -     Hemoglobin A1c  Hypertensive heart and renal disease with renal failure, stage 1 through stage 4 or unspecified chronic kidney disease, without heart failure Assessment & Plan: Chronic, well controlled. EKG performed, NSR w/ incomplete right bundle  branch block and right axis -possible right ventricular hypertrophy. She will c/w losartan  50mg  daily. She is encouraged to follow low sodium diet. She will f/u in 4 months for re-evaluation.   Orders: -     EKG 12-Lead -     POCT urinalysis dipstick -     Microalbumin / creatinine urine ratio -     CMP14+EGFR -     Lipid panel  Atherosclerosis of aorta (HCC) Assessment & Plan: Chronic, LDL goal < 70.  She will c/w pravastatin  40mg  daily. She is encouraged to follow heart healthy lifestyle.   Orders: -     Lipid panel  Right ear impacted cerumen Assessment & Plan: After obtaining verbal consent, right ear was flushed by irrigation. No TM abnormalities were noted. He tolerated procedure well without any complications.     Orders: -     Ear Lavage  DOE (dyspnea on exertion) -     ECHOCARDIOGRAM COMPLETE; Future  Breast tenderness Assessment & Plan: Bilateral breasts are tender. -Possible link to caffeine intake -Reduce caffeine intake -Consider imaging if persistent -Last mammo 2019, states "they" told her she didn't need mammograms anymore due to her age     Return for 1 year physical, 4 month DM.  Patient was given opportunity to ask questions. Patient verbalized understanding of the plan and was able to repeat key elements of the plan. All questions were answered to their satisfaction.   I, Smiley Dung, MD, have reviewed all documentation for this visit. The documentation on 05/30/23 for the exam, diagnosis, procedures, and orders are all accurate and complete.   IF YOU HAVE BEEN REFERRED TO A SPECIALIST, IT MAY TAKE 1-2 WEEKS TO SCHEDULE/PROCESS THE REFERRAL. IF YOU HAVE NOT HEARD FROM US /SPECIALIST IN TWO WEEKS, PLEASE GIVE US  A CALL AT [phone].   THE PATIENT IS ENCOURAGED TO PRACTICE SOCIAL DISTANCING DUE TO THE COVID-19 PANDEMIC.

## 2023-05-31 LAB — LIPID PANEL
Chol/HDL Ratio: 2.9 ratio (ref 0.0–4.4)
Cholesterol, Total: 154 mg/dL (ref 100–199)
HDL: 53 mg/dL (ref >39)
LDL Chol Calc (NIH): 79 mg/dL (ref 0–99)
Triglycerides: 127 mg/dL (ref 0–149)
VLDL Cholesterol Cal: 22 mg/dL (ref 5–40)

## 2023-05-31 LAB — MICROALBUMIN / CREATININE URINE RATIO
Creatinine, Urine: 98.9 mg/dL
Microalb/Creat Ratio: <3 mg/g{creat} (ref 0–29)
Microalbumin, Urine: <3 ug/mL

## 2023-05-31 LAB — CMP14+EGFR
ALT: 12 IU/L (ref 0–32)
AST: 21 IU/L (ref 0–40)
Albumin: 4.3 g/dL (ref 3.7–4.7)
Alkaline Phosphatase: 52 IU/L (ref 44–121)
BUN/Creatinine Ratio: 9 — ABNORMAL LOW (ref 12–28)
BUN: 10 mg/dL (ref 8–27)
Bilirubin Total: 0.4 mg/dL (ref 0.0–1.2)
CO2: 24 mmol/L (ref 20–29)
Calcium: 9.7 mg/dL (ref 8.7–10.3)
Chloride: 103 mmol/L (ref 96–106)
Creatinine, Ser: 1.11 mg/dL — ABNORMAL HIGH (ref 0.57–1.00)
Globulin, Total: 2.5 g/dL (ref 1.5–4.5)
Glucose: 103 mg/dL — ABNORMAL HIGH (ref 70–99)
Potassium: 4.5 mmol/L (ref 3.5–5.2)
Sodium: 142 mmol/L (ref 134–144)
Total Protein: 6.8 g/dL (ref 6.0–8.5)
eGFR: 49 mL/min/{1.73_m2} — ABNORMAL LOW (ref >59)

## 2023-05-31 LAB — HEMOGLOBIN A1C
Est. average glucose Bld gHb Est-mCnc: 128 mg/dL
Hgb A1c MFr Bld: 6.1 % — ABNORMAL HIGH (ref 4.8–5.6)

## 2023-06-03 ENCOUNTER — Ambulatory Visit: Payer: Self-pay | Admitting: Internal Medicine

## 2023-06-04 DIAGNOSIS — H6121 Impacted cerumen, right ear: Secondary | ICD-10-CM | POA: Insufficient documentation

## 2023-06-04 DIAGNOSIS — N644 Mastodynia: Secondary | ICD-10-CM | POA: Insufficient documentation

## 2023-06-04 NOTE — Assessment & Plan Note (Signed)
 Chronic, well controlled. EKG performed, NSR w/ incomplete right bundle branch block and right axis -possible right ventricular hypertrophy. She will c/w losartan  50mg  daily. She is encouraged to follow low sodium diet. She will f/u in 4 months for re-evaluation.

## 2023-06-04 NOTE — Assessment & Plan Note (Signed)

## 2023-06-04 NOTE — Assessment & Plan Note (Signed)
 After obtaining verbal consent, right ear was flushed by irrigation. No TM abnormalities were noted. He tolerated procedure well without any complications.

## 2023-06-04 NOTE — Assessment & Plan Note (Addendum)
 Bilateral breasts are tender. -Possible link to caffeine intake -Reduce caffeine intake -Consider imaging if persistent -Last mammo 2019, states "they" told her she didn't need mammograms anymore due to her age

## 2023-06-04 NOTE — Assessment & Plan Note (Signed)
Chronic, LDL goal < 70.  She will c/w pravastatin 40mg  daily. She is encouraged to follow heart healthy lifestyle.

## 2023-07-04 ENCOUNTER — Other Ambulatory Visit: Payer: Medicare Other

## 2023-07-05 ENCOUNTER — Other Ambulatory Visit (HOSPITAL_BASED_OUTPATIENT_CLINIC_OR_DEPARTMENT_OTHER)

## 2023-07-20 ENCOUNTER — Ambulatory Visit

## 2023-08-01 ENCOUNTER — Ambulatory Visit (HOSPITAL_COMMUNITY)

## 2023-08-18 ENCOUNTER — Other Ambulatory Visit: Payer: Self-pay | Admitting: Internal Medicine

## 2023-08-18 MED ORDER — ALPRAZOLAM 0.25 MG PO TABS: 0.2500 mg | ORAL_TABLET | Freq: Two times a day (BID) | ORAL | 0 refills | Status: AC | PRN

## 2023-09-15 ENCOUNTER — Other Ambulatory Visit: Payer: Self-pay | Admitting: Internal Medicine

## 2023-09-15 NOTE — Telephone Encounter (Signed)
 Copied from CRM [id number]. Topic: Clinical - Medication Refill >> Sep 15, 2023  4:33 PM Delon HERO wrote: Medication: JANUVIA  100 MG tablet [[id number]]  Has the patient contacted their pharmacy? Yes (Agent: If no, request that the patient contact the pharmacy for the refill. If patient does not wish to contact the pharmacy document the reason why and proceed with request.) (Agent: If yes, when and what did the pharmacy advise?)  This is the patient's preferred pharmacy:  [facility] [address], [place] - [address] AT [address] [place] [address] Phone: [phone] Fax: [phone]   Is this the correct pharmacy for this prescription? Yes If no, delete pharmacy and type the correct one.   Has the prescription been filled recently? Yes  Is the patient out of the medication? Yes  Has the patient been seen for an appointment in the last year OR does the patient have an upcoming appointment? Yes  Can we respond through MyChart? Yes  Agent: Please be advised that Rx refills may take up to 3 business days. We ask that you follow-up with your pharmacy.

## 2023-10-03 ENCOUNTER — Ambulatory Visit (INDEPENDENT_AMBULATORY_CARE_PROVIDER_SITE_OTHER): Admitting: Internal Medicine

## 2023-10-03 ENCOUNTER — Encounter: Payer: Self-pay | Admitting: Internal Medicine

## 2023-10-03 VITALS — BP 122/78 | HR 73 | Temp 98.3°F | Ht 63.0 in | Wt 134.6 lb

## 2023-10-03 DIAGNOSIS — E559 Vitamin D deficiency, unspecified: Secondary | ICD-10-CM | POA: Diagnosis not present

## 2023-10-03 DIAGNOSIS — I1310 Hypertensive heart and chronic kidney disease without heart failure, with stage 1 through stage 4 chronic kidney disease, or unspecified chronic kidney disease: Secondary | ICD-10-CM | POA: Diagnosis not present

## 2023-10-03 DIAGNOSIS — I700 Atherosclerosis of aorta: Secondary | ICD-10-CM | POA: Diagnosis not present

## 2023-10-03 DIAGNOSIS — E1122 Type 2 diabetes mellitus with diabetic chronic kidney disease: Secondary | ICD-10-CM

## 2023-10-03 DIAGNOSIS — N182 Chronic kidney disease, stage 2 (mild): Secondary | ICD-10-CM | POA: Diagnosis not present

## 2023-10-03 MED ORDER — PRAVASTATIN SODIUM 40 MG PO TABS: 40.0000 mg | ORAL_TABLET | Freq: Every day | ORAL | 1 refills | Status: AC

## 2023-10-03 MED ORDER — LOSARTAN POTASSIUM 50 MG PO TABS: ORAL_TABLET | ORAL | 1 refills | Status: AC

## 2023-10-03 NOTE — Patient Instructions (Signed)

## 2023-10-03 NOTE — Progress Notes (Signed)
 I,Victoria T Emmitt, CMA,acting as a Neurosurgeon for Catheryn LOISE Slocumb, MD.,have documented all relevant documentation on the behalf of Catheryn LOISE Slocumb, MD,as directed by  Catheryn LOISE Slocumb, MD while in the presence of Catheryn LOISE Slocumb, MD.  Subjective:  Patient ID: Vickie Brown , female    DOB: April 13, 1940 , 83 y.o.   MRN: [id number]  Chief Complaint  Patient presents with   Diabetes    Patient presents today for dm & bpc. She reports compliance with medications. Denies headache, chest pain & sob.   Hypertension    HPI Discussed the use of AI scribe software for clinical note transcription with the patient, who gave verbal consent to proceed.  History of Present Illness Vickie Brown is an 83 year old female with diabetes and hypertension who presents for a diabetes and blood pressure check.  Blood sugar levels typically run under 130 mg/dL, occasionally reaching up to 130 mg/dL. She forgot to bring her blood sugar log to the appointment.  She is currently taking losartan  for blood pressure, and Januvia  and Farxiga for diabetes. She also takes calcium twice a day, vitamin C daily, and is unsure about the reasons for taking magnesium glycinate and turmeric.  She recently spent six weeks in [place]  to care for her son who had lung cancer surgery. She helped him with his recovery by encouraging him to walk and eat more, but he unfortunately passed away at the end of 09-10-2023. This has been a significant emotional event for her, as she describes her son as her 'backbone'.   Diabetes She presents for her follow-up diabetic visit. She has type 2 diabetes mellitus. Her disease course has been stable. There are no hypoglycemic associated symptoms. Pertinent negatives for hypoglycemia include no headaches. Pertinent negatives for diabetes include no blurred vision, no chest pain, no polydipsia, no polyphagia and no polyuria. There are no hypoglycemic complications. Risk factors for coronary artery disease  include diabetes mellitus, dyslipidemia, hypertension and post-menopausal. She is following a diabetic diet. Her breakfast blood glucose is taken between 8-9 am. Her breakfast blood glucose range is generally 90-110 mg/dl.  Hypertension This is a chronic problem. The current episode started more than 1 year ago. The problem has been gradually improving since onset. Pertinent negatives include no blurred vision, chest pain, headaches, palpitations or shortness of breath.     Past Medical History:  Diagnosis Date   Chronic headaches    Diverticulitis    Hyperlipidemia    Hypertension    Knee pain    Type 2 diabetes mellitus (HCC)      Family History  Problem Relation Age of Onset   Cervical cancer Mother    Cancer Mother    Diabetes Father    Diabetes Sister    Diabetes Brother    Other Brother        killed   Breast cancer Neg Hx      Current Outpatient Medications:    acetaminophen  (TYLENOL ) 325 MG tablet, Take 650 mg by mouth every 6 (six) hours as needed., Disp: , Rfl:    alendronate  (FOSAMAX ) 70 MG tablet, TAKE 1 TABLET(70 MG) BY MOUTH 1 TIME A WEEK WITH A FULL GLASS OF WATER AND ON AN EMPTY STOMACH, Disp: 12 tablet, Rfl: 3   ALPRAZolam  (XANAX ) 0.25 MG tablet, Take 1 tablet (0.25 mg total) by mouth 2 (two) times daily as needed for anxiety., Disp: 20 tablet, Rfl: 0   ascorbic acid (VITAMIN C) 500 MG tablet, Take 500  mg by mouth daily., Disp: , Rfl:    aspirin 81 MG tablet, Take 81 mg by mouth every evening., Disp: , Rfl:    BIOTIN PO, Take by mouth daily., Disp: , Rfl:    Calcium Carbonate (CALCIUM 500 PO), Take by mouth., Disp: , Rfl:    Cholecalciferol (VITAMIN D3) 2000 UNITS TABS, Take 1 tablet by mouth daily., Disp: , Rfl:    dapagliflozin propanediol (FARXIGA) 5 MG TABS tablet, Take 5 mg by mouth daily., Disp: , Rfl:    fluticasone  (FLONASE ) 50 MCG/ACT nasal spray, Place 1 spray into both nostrils at bedtime., Disp: 16 g, Rfl: 2   Homeopathic Products (OSCILLOCOCCINUM  PO), Take by mouth., Disp: , Rfl:    JANUVIA  100 MG tablet, TAKE 1 TABLET(100 MG) BY MOUTH DAILY, Disp: 90 tablet, Rfl: 1   Multiple Vitamins-Minerals (ALIVE ONCE DAILY WOMENS 50+ PO), Take 1 tablet by mouth daily., Disp: , Rfl:    Probiotic Product (ALIGN) 4 MG CAPS, Take 4 mg by mouth daily., Disp: , Rfl:    TURMERIC PO, Take by mouth., Disp: , Rfl:    UNABLE TO FIND, Med Name: Unkers therapeutic Rub, Disp: , Rfl:    vitamin B-12 (CYANOCOBALAMIN ) 500 MCG tablet, Take 500 mcg by mouth daily., Disp: , Rfl:    cetirizine (ZYRTEC) 10 MG tablet, Take 10 mg by mouth at bedtime. (Patient not taking: Reported on 10/03/2023), Disp: , Rfl:    losartan  (COZAAR ) 50 MG tablet, TAKE 1 TABLET(50 MG) BY MOUTH DAILY, Disp: 90 tablet, Rfl: 1   pravastatin  (PRAVACHOL ) 40 MG tablet, Take 1 tablet (40 mg total) by mouth daily. TAKE 1 TABLET(40 MG) BY MOUTH DAILY, Disp: 90 tablet, Rfl: 1   No Known Allergies   Review of Systems  Constitutional: Negative.   Eyes:  Negative for blurred vision.  Respiratory: Negative.  Negative for shortness of breath.   Cardiovascular: Negative.  Negative for chest pain and palpitations.  Gastrointestinal: Negative.   Endocrine: Negative for polydipsia, polyphagia and polyuria.  Neurological: Negative.  Negative for headaches.  Psychiatric/Behavioral: Negative.       Today's Vitals   10/03/23 1117  BP: 122/78  Pulse: 73  Temp: 98.3 F (36.8 C)  SpO2: 98%  Weight: 134 lb 9.6 oz (61.1 kg)  Height: 5' 3 (1.6 m)   Body mass index is 23.84 kg/m.  Wt Readings from Last 3 Encounters:  10/03/23 134 lb 9.6 oz (61.1 kg)  05/30/23 137 lb (62.1 kg)  02/14/23 143 lb (64.9 kg)     Objective:  Physical Exam Vitals and nursing note reviewed.  Constitutional:      Appearance: Normal appearance.  HENT:     Head: Normocephalic and atraumatic.  Cardiovascular:     Rate and Rhythm: Normal rate and regular rhythm.     Heart sounds: Normal heart sounds.  Pulmonary:      Effort: Pulmonary effort is normal.     Breath sounds: Normal breath sounds.  Skin:    General: Skin is warm.  Neurological:     General: No focal deficit present.     Mental Status: She is alert.  Psychiatric:        Mood and Affect: Mood normal.        Behavior: Behavior normal.         Assessment And Plan:  Type 2 diabetes mellitus with stage 2 chronic kidney disease, without long-term current use of insulin (HCC) Assessment & Plan: Chronic, importance of dietary/medication compliance was  discussed with the patient.  She is currently on Januvia  100mg  daily and Farxiga 5mg  daily.  I will check labs as below.  I may add metformin in the future if needed.  She will rto in 3-4 months for re-evaluation.    Orders: -     CMP14+EGFR -     Lipid panel -     Hemoglobin A1c -     TSH  Hypertensive heart and renal disease with renal failure, stage 1 through stage 4 or unspecified chronic kidney disease, without heart failure Assessment & Plan: Chronic, well controlled. She will c/w losartan  50mg  daily. She is encouraged to follow low sodium diet. She will f/u in 4 months for re-evaluation.   Orders: -     CMP14+EGFR -     Lipid panel -     Losartan  Potassium; TAKE 1 TABLET(50 MG) BY MOUTH DAILY  Dispense: 90 tablet; Refill: 1  Atherosclerosis of aorta Assessment & Plan: Chronic, LDL goal < 70.  She will c/w pravastatin  40mg  daily. She is encouraged to follow heart healthy lifestyle.   Orders: -     Lipid panel -     Pravastatin  Sodium; Take 1 tablet (40 mg total) by mouth daily. TAKE 1 TABLET(40 MG) BY MOUTH DAILY  Dispense: 90 tablet; Refill: 1  Vitamin D  deficiency disease Assessment & Plan: I WILL CHECK A VIT D LEVEL AND SUPPLEMENT AS NEEDED.  ALSO ENCOURAGED TO SPEND 15 MINUTES IN THE SUN DAILY.   Orders: -     VITAMIN D  25 Hydroxy (Vit-D Deficiency, Fractures)   Return for 4 month dm f/u.SABRA  Patient was given opportunity to ask questions. Patient verbalized  understanding of the plan and was able to repeat key elements of the plan. All questions were answered to their satisfaction.   I, Catheryn LOISE Slocumb, MD, have reviewed all documentation for this visit. The documentation on 10/03/23 for the exam, diagnosis, procedures, and orders are all accurate and complete.  IF YOU HAVE BEEN REFERRED TO A SPECIALIST, IT MAY TAKE 1-2 WEEKS TO SCHEDULE/PROCESS THE REFERRAL. IF YOU HAVE NOT HEARD FROM US /SPECIALIST IN TWO WEEKS, PLEASE GIVE US  A CALL AT [phone] X [phone].   THE PATIENT IS ENCOURAGED TO PRACTICE SOCIAL DISTANCING DUE TO THE COVID-19 PANDEMIC.

## 2023-10-04 LAB — CMP14+EGFR
ALT: 8 IU/L (ref 0–32)
AST: 16 IU/L (ref 0–40)
Albumin: 4.3 g/dL (ref 3.7–4.7)
Alkaline Phosphatase: 55 IU/L (ref 48–129)
BUN/Creatinine Ratio: 10 — ABNORMAL LOW (ref 12–28)
BUN: 11 mg/dL (ref 8–27)
Bilirubin Total: 0.3 mg/dL (ref 0.0–1.2)
CO2: 25 mmol/L (ref 20–29)
Calcium: 9.7 mg/dL (ref 8.7–10.3)
Chloride: 100 mmol/L (ref 96–106)
Creatinine, Ser: 1.06 mg/dL — ABNORMAL HIGH (ref 0.57–1.00)
Globulin, Total: 2.6 g/dL (ref 1.5–4.5)
Glucose: 98 mg/dL (ref 70–99)
Potassium: 5 mmol/L (ref 3.5–5.2)
Sodium: 140 mmol/L (ref 134–144)
Total Protein: 6.9 g/dL (ref 6.0–8.5)
eGFR: 52 mL/min/1.73 — ABNORMAL LOW (ref >59)

## 2023-10-04 LAB — LIPID PANEL
Chol/HDL Ratio: 3 ratio (ref 0.0–4.4)
Cholesterol, Total: 171 mg/dL (ref 100–199)
HDL: 57 mg/dL (ref >39)
LDL Chol Calc (NIH): 93 mg/dL (ref 0–99)
Triglycerides: 121 mg/dL (ref 0–149)
VLDL Cholesterol Cal: 21 mg/dL (ref 5–40)

## 2023-10-04 LAB — VITAMIN D 25 HYDROXY (VIT D DEFICIENCY, FRACTURES): Vit D, 25-Hydroxy: 91.6 ng/mL (ref 30.0–100.0)

## 2023-10-04 LAB — HEMOGLOBIN A1C
Est. average glucose Bld gHb Est-mCnc: 126 mg/dL
Hgb A1c MFr Bld: 6 % — ABNORMAL HIGH (ref 4.8–5.6)

## 2023-10-04 LAB — TSH: TSH: 1.4 u[IU]/mL (ref 0.450–4.500)

## 2023-10-08 ENCOUNTER — Ambulatory Visit: Payer: Self-pay | Admitting: Internal Medicine

## 2023-10-08 NOTE — Assessment & Plan Note (Signed)
Chronic, LDL goal < 70.  She will c/w pravastatin 40mg  daily. She is encouraged to follow heart healthy lifestyle.

## 2023-10-08 NOTE — Assessment & Plan Note (Signed)
 I WILL CHECK A VIT D LEVEL AND SUPPLEMENT AS NEEDED.  ALSO ENCOURAGED TO SPEND 15 MINUTES IN THE SUN DAILY.

## 2023-10-08 NOTE — Assessment & Plan Note (Signed)
 Chronic, importance of dietary/medication compliance was discussed with the patient.  She is currently on Januvia  100mg  daily and Farxiga 5mg  daily.  I will check labs as below.  I may add metformin in the future if needed.  She will rto in 3-4 months for re-evaluation.

## 2023-10-08 NOTE — Assessment & Plan Note (Signed)
 Chronic, well controlled. She will c/w losartan 50mg  daily. She is encouraged to follow low sodium diet. She will f/u in 4 months for re-evaluation.

## 2023-10-19 ENCOUNTER — Telehealth: Payer: Self-pay

## 2023-10-19 NOTE — Telephone Encounter (Signed)
 PAP: Patient assistance application for Farxiga  through [employer] (AZ[employer]Me) has been mailed to pt's home address on file. Provider portion of application will be faxed to provider's office.For renewal 2026.

## 2023-10-27 ENCOUNTER — Other Ambulatory Visit (HOSPITAL_COMMUNITY): Payer: Self-pay

## 2023-10-31 ENCOUNTER — Other Ambulatory Visit (HOSPITAL_COMMUNITY): Payer: Self-pay

## 2023-10-31 ENCOUNTER — Telehealth: Payer: Self-pay

## 2023-10-31 NOTE — Telephone Encounter (Signed)
 Copied from CRM #[id number]. Topic: Clinical - Lab/Test Results >> Oct 28, 2023  1:37 PM Everette C wrote: Reason for CRM: The patient has reviewed their labs from 10/03/23 and had no additional questions at the time. The patient is believes (but was not 100% certain) that they've taken their cholesterol medication regularly   Please contact further when possible

## 2023-11-01 ENCOUNTER — Ambulatory Visit (INDEPENDENT_AMBULATORY_CARE_PROVIDER_SITE_OTHER): Payer: Self-pay

## 2023-11-01 VITALS — BP 110/60 | HR 98 | Temp 98.6°F | Ht 63.0 in | Wt 134.0 lb

## 2023-11-01 DIAGNOSIS — Z23 Encounter for immunization: Secondary | ICD-10-CM | POA: Diagnosis not present

## 2023-11-01 NOTE — Progress Notes (Signed)
 Patient is in office today for a nurse visit for Immunization. Patient Injection was given in the  Right deltoid. Patient tolerated injection well.

## 2023-11-09 ENCOUNTER — Telehealth: Payer: Self-pay | Admitting: Pharmacist

## 2023-11-09 DIAGNOSIS — E1122 Type 2 diabetes mellitus with diabetic chronic kidney disease: Secondary | ICD-10-CM

## 2023-11-09 NOTE — Progress Notes (Signed)
   11/09/2023  Patient ID: Vickie Brown, female   DOB: 1940/07/23, 83 y.o.   MRN: [id number]  Completed and obtained signature from Provider for [employer] Me-Farxiga renewal for 2026.  Lab Results  Component Value Date   HGBA1C 6.0 (H) 10/03/2023   HGBA1C 6.1 (H) 05/30/2023   HGBA1C 6.4 (H) 02/14/2023    Faxed back to Vickie Brown, CPhT for processing and scanning.   Vickie Brown, PharmD, BCACP Clinical Pharmacist [phone]

## 2023-11-10 NOTE — Telephone Encounter (Signed)
 Received Provider portion PAP application (AZ[employer]ME ) Doreen

## 2023-11-25 LAB — OPHTHALMOLOGY REPORT-SCANNED

## 2023-12-21 ENCOUNTER — Telehealth: Payer: Self-pay | Admitting: Pharmacist

## 2023-12-21 DIAGNOSIS — E1122 Type 2 diabetes mellitus with diabetic chronic kidney disease: Secondary | ICD-10-CM

## 2023-12-21 NOTE — Progress Notes (Signed)
° °  Patient was called regarding medication assistance with Farxiga. We have not received her portion of the application. She was being called to offer her the opportunity to complete her portion of the application in clinic.  HIPAA identifiers were obtained.  Luke Mall, CPhT mailed the Patient forms in October but Ms. Gradilla said she did not get them.  The forms will be printed and the Patient can come into the clinic next Wednesday and sign them.   Cassius DOROTHA Brought, PharmD, BCACP Clinical Pharmacist [phone]

## 2023-12-23 ENCOUNTER — Ambulatory Visit: Admitting: Family Medicine

## 2023-12-23 ENCOUNTER — Encounter: Payer: Self-pay | Admitting: Family Medicine

## 2023-12-23 VITALS — BP 124/78 | HR 78 | Temp 98.5°F | Ht 63.0 in | Wt 134.5 lb

## 2023-12-23 DIAGNOSIS — Z794 Long term (current) use of insulin: Secondary | ICD-10-CM

## 2023-12-23 DIAGNOSIS — N1831 Chronic kidney disease, stage 3a: Secondary | ICD-10-CM

## 2023-12-23 DIAGNOSIS — E1122 Type 2 diabetes mellitus with diabetic chronic kidney disease: Secondary | ICD-10-CM

## 2023-12-23 DIAGNOSIS — J309 Allergic rhinitis, unspecified: Secondary | ICD-10-CM | POA: Diagnosis not present

## 2023-12-23 DIAGNOSIS — R0989 Other specified symptoms and signs involving the circulatory and respiratory systems: Secondary | ICD-10-CM | POA: Diagnosis not present

## 2023-12-23 DIAGNOSIS — R0982 Postnasal drip: Secondary | ICD-10-CM | POA: Diagnosis not present

## 2023-12-23 DIAGNOSIS — R051 Acute cough: Secondary | ICD-10-CM

## 2023-12-23 DIAGNOSIS — J029 Acute pharyngitis, unspecified: Secondary | ICD-10-CM

## 2023-12-23 LAB — POC SOFIA 2 FLU + SARS ANTIGEN FIA
Influenza A, POC: NEGATIVE
Influenza B, POC: NEGATIVE
SARS Coronavirus 2 Ag: NEGATIVE

## 2023-12-23 LAB — POCT RAPID STREP A (OFFICE): Rapid Strep A Screen: NEGATIVE

## 2023-12-23 MED ORDER — CHLORASEPTIC 1.4 % MT LIQD: 1.0000 | OROMUCOSAL | 0 refills | Status: AC | PRN

## 2023-12-23 MED ORDER — TRIAMCINOLONE ACETONIDE 40 MG/ML IJ SUSP
40.0000 mg | Freq: Once | INTRAMUSCULAR | Status: AC
  Administered 2023-12-23: 40 mg via INTRAMUSCULAR

## 2023-12-23 MED ORDER — BENZONATATE 100 MG PO CAPS: 100.0000 mg | ORAL_CAPSULE | Freq: Three times a day (TID) | ORAL | 0 refills | Status: AC | PRN

## 2023-12-23 NOTE — Progress Notes (Signed)
 LILLETTE Kristeen JINNY Gladis, CMA,acting as a neurosurgeon for Vickie Creighton, NP.,have documented all relevant documentation on the behalf of Vickie Creighton, NP,as directed by  Vickie Creighton, NP while in the presence of Vickie Creighton, NP.  Subjective:  Patient ID: Vickie Brown , female    DOB: [birth year] , 83 y.o.   MRN: [id number]  Chief Complaint  Patient presents with   Cough    Patient presents today for a cough, sore throat, and runny nose that started on Tuesday. Patient reports she has tried otc medications with no relief.     HPI Discussed the use of AI scribe software for clinical note transcription with the patient, who gave verbal consent to proceed.  History of Present Illness  Vickie Brown is an 83 year old female who presents with a sore throat.  She has been experiencing a sore throat since Wednesday, described as painful, particularly when swallowing, with a severity of 5 to 7 out of 10. The pain has made it difficult for her to eat or drink water. She has been using over-the-counter medications for cold and sore throat, similar to those used during a previous flu episode, without relief.  She denies fever and notes that no one around her is experiencing similar symptoms. She reports that she was tested for COVID-19 and flu during this visit.  In addition to the sore throat, she experiences a runny nose and cough. The nasal discharge is sometimes clear and other times yellow. The cough is mostly dry, with occasional production of phlegm. She mentions increased nasal blowing, especially at night, and describes the nasal drainage as sometimes 'rolling'.  No ear pain, but she mentions a sensation in her ears when cleaning them. She has not been eating as much as usual due to the discomfort in her throat. She usually drinks a lot of water, which has been hindered by her current symptoms.    Vickie Brown is an 83 year old female who presents with a sore throat.  She has been experiencing a sore  throat since Wednesday, described as painful, particularly when swallowing, with a severity of 5 to 7 out of 10. The pain has made it difficult for her to eat or drink water. She has been using over-the-counter medications for cold and sore throat, similar to those used during a previous flu episode, without relief.  She denies fever and notes that no one around her is experiencing similar symptoms. She reports that she was tested for COVID-19 and flu during this visit.  In addition to the sore throat, she experiences a runny nose and cough. The nasal discharge is sometimes clear and other times yellow. The cough is mostly dry, with occasional production of phlegm. She mentions increased nasal blowing, especially at night, and describes the nasal drainage as sometimes 'rolling'.  No ear pain, but she mentions a sensation in her ears when cleaning them. She has not been eating as much as usual due to the discomfort in her throat. She usually drinks a lot of water, which has been hindered by her current symptoms.         Past Medical History:  Diagnosis Date   Chronic headaches    Diverticulitis    Hyperlipidemia    Hypertension    Knee pain    Type 2 diabetes mellitus (HCC)      Family History  Problem Relation Age of Onset   Cervical cancer Mother    Cancer Mother  Diabetes Father    Diabetes Sister    Diabetes Brother    Other Brother        killed   Breast cancer Neg Hx     Current Medications[1]   Allergies[2]   Review of Systems  Constitutional:  Negative for fever.  HENT:  Positive for postnasal drip and sore throat.   Respiratory:  Positive for cough. Negative for shortness of breath and wheezing.      Today's Vitals   12/23/23 1033  BP: 124/78  Pulse: 78  Temp: 98.5 F (36.9 C)  TempSrc: Oral  Weight: 134 lb 8 oz (61 kg)  Height: 5' 3 (1.6 m)  PainSc: 5   PainLoc: Throat   Body mass index is 23.83 kg/m.  Wt Readings from Last 3 Encounters:   12/23/23 134 lb 8 oz (61 kg)  11/01/23 134 lb (60.8 kg)  10/03/23 134 lb 9.6 oz (61.1 kg)    The ASCVD Risk score (Arnett DK, et al., 2019) failed to calculate for the following reasons:   The 2019 ASCVD risk score is only valid for ages 32 to 63   * - Cholesterol units were assumed  Objective:  Physical Exam Constitutional:      Appearance: Normal appearance.  HENT:     Nose:     Right Sinus: No maxillary sinus tenderness or frontal sinus tenderness.     Left Sinus: No maxillary sinus tenderness or frontal sinus tenderness.     Mouth/Throat:     Pharynx: No pharyngeal swelling or posterior oropharyngeal erythema.  Cardiovascular:     Rate and Rhythm: Normal rate and regular rhythm.     Pulses: Normal pulses.     Heart sounds: Normal heart sounds.  Pulmonary:     Effort: Pulmonary effort is normal.     Breath sounds: Normal breath sounds.  Abdominal:     General: Bowel sounds are normal.  Neurological:     Mental Status: She is alert.         Assessment And Plan:   Assessment & Plan Runny nose Negative for COVID/FLU Sore throat Strep test negative, ordered chloraseptic throat spray Allergic rhinitis with postnasal drip Gave samples of zyrtec  Diabetes mellitus due to underlying condition with stage 3a chronic kidney disease, with long-term current use of insulin (HCC) Continue current treatment     Return for keep next appt.  Patient was given opportunity to ask questions. Patient verbalized understanding of the plan and was able to repeat key elements of the plan. All questions were answered to their satisfaction.    I, Vickie Creighton, NP, have reviewed all documentation for this visit. The documentation on 01/04/2024 for the exam, diagnosis, procedures, and orders are all accurate and complete.   IF YOU HAVE BEEN REFERRED TO A SPECIALIST, IT MAY TAKE 1-2 WEEKS TO SCHEDULE/PROCESS THE REFERRAL. IF YOU HAVE NOT HEARD FROM US /SPECIALIST IN TWO WEEKS, PLEASE GIVE US   A CALL AT [phone].      [1]  Current Outpatient Medications:    acetaminophen  (TYLENOL ) 325 MG tablet, Take 650 mg by mouth every 6 (six) hours as needed., Disp: , Rfl:    alendronate  (FOSAMAX ) 70 MG tablet, TAKE 1 TABLET(70 MG) BY MOUTH 1 TIME A WEEK WITH A FULL GLASS OF WATER AND ON AN EMPTY STOMACH, Disp: 12 tablet, Rfl: 3   ALPRAZolam  (XANAX ) 0.25 MG tablet, Take 1 tablet (0.25 mg total) by mouth 2 (two) times daily as needed for anxiety.,  Disp: 20 tablet, Rfl: 0   ascorbic acid (VITAMIN C) 500 MG tablet, Take 500 mg by mouth daily., Disp: , Rfl:    aspirin 81 MG tablet, Take 81 mg by mouth every evening., Disp: , Rfl:    benzonatate  (TESSALON ) 100 MG capsule, Take 1 capsule (100 mg total) by mouth 3 (three) times daily as needed for cough., Disp: 20 capsule, Rfl: 0   BIOTIN PO, Take by mouth daily., Disp: , Rfl:    Calcium Carbonate (CALCIUM 500 PO), Take by mouth., Disp: , Rfl:    Cholecalciferol (VITAMIN D3) 2000 UNITS TABS, Take 1 tablet by mouth daily., Disp: , Rfl:    dapagliflozin propanediol (FARXIGA) 5 MG TABS tablet, Take 5 mg by mouth daily., Disp: , Rfl:    fluticasone  (FLONASE ) 50 MCG/ACT nasal spray, Place 1 spray into both nostrils at bedtime., Disp: 16 g, Rfl: 2   Homeopathic Products (OSCILLOCOCCINUM PO), Take by mouth., Disp: , Rfl:    JANUVIA  100 MG tablet, TAKE 1 TABLET(100 MG) BY MOUTH DAILY, Disp: 90 tablet, Rfl: 1   losartan  (COZAAR ) 50 MG tablet, TAKE 1 TABLET(50 MG) BY MOUTH DAILY, Disp: 90 tablet, Rfl: 1   Multiple Vitamins-Minerals (ALIVE ONCE DAILY WOMENS 50+ PO), Take 1 tablet by mouth daily., Disp: , Rfl:    phenol (CHLORASEPTIC) 1.4 % LIQD, Use as directed 1 spray in the mouth or throat as needed for throat irritation / pain., Disp: 118 mL, Rfl: 0   pravastatin  (PRAVACHOL ) 40 MG tablet, Take 1 tablet (40 mg total) by mouth daily. TAKE 1 TABLET(40 MG) BY MOUTH DAILY, Disp: 90 tablet, Rfl: 1   Probiotic Product (ALIGN) 4 MG CAPS, Take 4 mg by mouth  daily., Disp: , Rfl:    TURMERIC PO, Take by mouth., Disp: , Rfl:    UNABLE TO FIND, Med Name: Unkers therapeutic Rub, Disp: , Rfl:    vitamin B-12 (CYANOCOBALAMIN ) 500 MCG tablet, Take 500 mcg by mouth daily., Disp: , Rfl:    cetirizine (ZYRTEC) 10 MG tablet, Take 10 mg by mouth at bedtime. (Patient not taking: Reported on 12/23/2023), Disp: , Rfl:  [2] No Known Allergies

## 2024-01-04 DIAGNOSIS — J309 Allergic rhinitis, unspecified: Secondary | ICD-10-CM | POA: Insufficient documentation

## 2024-01-04 DIAGNOSIS — Z794 Long term (current) use of insulin: Secondary | ICD-10-CM | POA: Insufficient documentation

## 2024-01-04 DIAGNOSIS — R0989 Other specified symptoms and signs involving the circulatory and respiratory systems: Secondary | ICD-10-CM | POA: Insufficient documentation

## 2024-01-04 DIAGNOSIS — J029 Acute pharyngitis, unspecified: Secondary | ICD-10-CM | POA: Insufficient documentation

## 2024-01-04 DIAGNOSIS — N1831 Chronic kidney disease, stage 3a: Secondary | ICD-10-CM | POA: Insufficient documentation

## 2024-01-04 NOTE — Addendum Note (Signed)
 Addended by: GLADIS KRISTEEN PARAS on: 01/04/2024 11:52 AM   Modules accepted: Orders

## 2024-01-04 NOTE — Assessment & Plan Note (Signed)
 Strep test negative, ordered chloraseptic throat spray

## 2024-01-04 NOTE — Assessment & Plan Note (Signed)
 Gave samples of zyrtec

## 2024-01-04 NOTE — Assessment & Plan Note (Signed)
 Continue current treatment.

## 2024-01-04 NOTE — Assessment & Plan Note (Signed)
 Negative for COVID/FLU

## 2024-01-24 ENCOUNTER — Encounter: Payer: Self-pay | Admitting: Internal Medicine

## 2024-01-24 ENCOUNTER — Ambulatory Visit: Payer: Self-pay | Admitting: Internal Medicine

## 2024-01-24 VITALS — BP 120/64 | HR 75 | Temp 98.3°F | Ht 63.0 in | Wt 133.0 lb

## 2024-01-24 DIAGNOSIS — I700 Atherosclerosis of aorta: Secondary | ICD-10-CM

## 2024-01-24 DIAGNOSIS — E1122 Type 2 diabetes mellitus with diabetic chronic kidney disease: Secondary | ICD-10-CM

## 2024-01-24 DIAGNOSIS — J069 Acute upper respiratory infection, unspecified: Secondary | ICD-10-CM | POA: Diagnosis not present

## 2024-01-24 DIAGNOSIS — I1310 Hypertensive heart and chronic kidney disease without heart failure, with stage 1 through stage 4 chronic kidney disease, or unspecified chronic kidney disease: Secondary | ICD-10-CM

## 2024-01-24 DIAGNOSIS — N1831 Chronic kidney disease, stage 3a: Secondary | ICD-10-CM

## 2024-01-24 NOTE — Progress Notes (Signed)
 I,Jameka J Llittleton, CMA,acting as a neurosurgeon for Catheryn LOISE Slocumb, MD.,have documented all relevant documentation on the behalf of Catheryn LOISE Slocumb, MD,as directed by  Catheryn LOISE Slocumb, MD while in the presence of Catheryn LOISE Slocumb, MD.  Subjective:  Patient ID: Vickie Brown , female    DOB: 1940/05/09 , 84 y.o.   MRN: [id number]  Chief Complaint  Patient presents with   Diabetes    Patient presents today for a bp and diabetes check. Patient reports compliance with her meds. Patient denies having any chest pain, sob or headaches at this time. Patient reports she was really sick last week but she is feeling better than she was. She is just still coughing.    HPI Discussed the use of AI scribe software for clinical note transcription with the patient, who gave verbal consent to proceed.  History of Present Illness Vickie Brown is an 84 year old female with diabetes and hypertension who presents for a diabetes and blood pressure check.  She manages her diabetes with Januvia  and Farxiga and has not been taking insulin. She has not checked her blood sugars for the last two to three weeks due to running out of strips.  Her blood pressure is managed with losartan , and she is on pravastatin  for cholesterol control.  Last week, she experienced a severe cold with symptoms of weakness, rhinorrhea, cough, and chills. She did not get tested for COVID-19 or flu. She has been using quercetin and oxalococcinum, increasing the dose when she feels sick.  She drinks approximately eight bottles of water a day, especially during her recent illness, and also drinks tea in the mornings.  She missed her annual wellness visit in July due to being in [place]  for over a month following her son's illness and death.   Diabetes She presents for her follow-up diabetic visit. She has type 2 diabetes mellitus. Her disease course has been stable. There are no hypoglycemic associated symptoms. Pertinent negatives for  hypoglycemia include no headaches. Pertinent negatives for diabetes include no blurred vision, no chest pain, no polydipsia, no polyphagia and no polyuria. There are no hypoglycemic complications. Risk factors for coronary artery disease include diabetes mellitus, dyslipidemia, hypertension and post-menopausal. She is following a diabetic diet. Her breakfast blood glucose is taken between 8-9 am. Her breakfast blood glucose range is generally 90-110 mg/dl.  Hypertension This is a chronic problem. The current episode started more than 1 year ago. The problem has been gradually improving since onset. Pertinent negatives include no blurred vision, chest pain, headaches, palpitations or shortness of breath.     Past Medical History:  Diagnosis Date   Chronic headaches    Diverticulitis    Hyperlipidemia    Hypertension    Knee pain    Type 2 diabetes mellitus (HCC)      Family History  Problem Relation Age of Onset   Cervical cancer Mother    Cancer Mother    Diabetes Father    Diabetes Sister    Diabetes Brother    Other Brother        killed   Breast cancer Neg Hx      Current Outpatient Medications:    acetaminophen  (TYLENOL ) 325 MG tablet, Take 650 mg by mouth every 6 (six) hours as needed., Disp: , Rfl:    alendronate  (FOSAMAX ) 70 MG tablet, TAKE 1 TABLET(70 MG) BY MOUTH 1 TIME A WEEK WITH A FULL GLASS OF WATER AND ON AN EMPTY STOMACH, Disp: 12 tablet,  Rfl: 3   ALPRAZolam  (XANAX ) 0.25 MG tablet, Take 1 tablet (0.25 mg total) by mouth 2 (two) times daily as needed for anxiety., Disp: 20 tablet, Rfl: 0   ascorbic acid (VITAMIN C) 500 MG tablet, Take 500 mg by mouth daily., Disp: , Rfl:    aspirin 81 MG tablet, Take 81 mg by mouth every evening., Disp: , Rfl:    benzonatate  (TESSALON ) 100 MG capsule, Take 1 capsule (100 mg total) by mouth 3 (three) times daily as needed for cough., Disp: 20 capsule, Rfl: 0   BIOTIN PO, Take by mouth daily., Disp: , Rfl:    Calcium Carbonate (CALCIUM  500 PO), Take by mouth., Disp: , Rfl:    cetirizine (ZYRTEC) 10 MG tablet, Take 10 mg by mouth at bedtime., Disp: , Rfl:    Cholecalciferol (VITAMIN D3) 2000 UNITS TABS, Take 1 tablet by mouth daily., Disp: , Rfl:    dapagliflozin propanediol (FARXIGA) 5 MG TABS tablet, Take 5 mg by mouth daily., Disp: , Rfl:    fluticasone  (FLONASE ) 50 MCG/ACT nasal spray, Place 1 spray into both nostrils at bedtime., Disp: 16 g, Rfl: 2   Homeopathic Products (OSCILLOCOCCINUM PO), Take by mouth., Disp: , Rfl:    JANUVIA  100 MG tablet, TAKE 1 TABLET(100 MG) BY MOUTH DAILY, Disp: 90 tablet, Rfl: 1   losartan  (COZAAR ) 50 MG tablet, TAKE 1 TABLET(50 MG) BY MOUTH DAILY, Disp: 90 tablet, Rfl: 1   Multiple Vitamins-Minerals (ALIVE ONCE DAILY WOMENS 50+ PO), Take 1 tablet by mouth daily., Disp: , Rfl:    phenol (CHLORASEPTIC) 1.4 % LIQD, Use as directed 1 spray in the mouth or throat as needed for throat irritation / pain., Disp: 118 mL, Rfl: 0   pravastatin  (PRAVACHOL ) 40 MG tablet, Take 1 tablet (40 mg total) by mouth daily. TAKE 1 TABLET(40 MG) BY MOUTH DAILY, Disp: 90 tablet, Rfl: 1   Probiotic Product (ALIGN) 4 MG CAPS, Take 4 mg by mouth daily., Disp: , Rfl:    TURMERIC PO, Take by mouth., Disp: , Rfl:    UNABLE TO FIND, Med Name: Unkers therapeutic Rub, Disp: , Rfl:    vitamin B-12 (CYANOCOBALAMIN ) 500 MCG tablet, Take 500 mcg by mouth daily., Disp: , Rfl:    No Known Allergies   Review of Systems  Constitutional: Negative.   Eyes: Negative.  Negative for blurred vision.  Respiratory: Negative.  Negative for shortness of breath.   Cardiovascular: Negative.  Negative for chest pain and palpitations.  Gastrointestinal: Negative.   Endocrine: Negative for polydipsia, polyphagia and polyuria.  Musculoskeletal: Negative.   Skin: Negative.   Neurological:  Negative for headaches.  Psychiatric/Behavioral: Negative.       Today's Vitals   01/24/24 1138  BP: 120/64  Pulse: 75  Temp: 98.3 F (36.8 C)   TempSrc: Oral  Weight: 133 lb (60.3 kg)  Height: 5' 3 (1.6 m)  PainSc: 0-No pain   Body mass index is 23.56 kg/m.  Wt Readings from Last 3 Encounters:  01/24/24 133 lb (60.3 kg)  12/23/23 134 lb 8 oz (61 kg)  11/01/23 134 lb (60.8 kg)    The ASCVD Risk score (Arnett DK, et al., 2019) failed to calculate for the following reasons:   The 2019 ASCVD risk score is only valid for ages 79 to 74   * - Cholesterol units were assumed  Objective:  Physical Exam Vitals and nursing note reviewed.  Constitutional:      Appearance: Normal appearance.  HENT:  Head: Normocephalic and atraumatic.  Eyes:     Extraocular Movements: Extraocular movements intact.  Cardiovascular:     Rate and Rhythm: Normal rate and regular rhythm.     Heart sounds: Normal heart sounds.  Pulmonary:     Effort: Pulmonary effort is normal.     Breath sounds: Normal breath sounds.  Musculoskeletal:     Cervical back: Normal range of motion.  Skin:    General: Skin is warm.  Neurological:     General: No focal deficit present.     Mental Status: She is alert.  Psychiatric:        Mood and Affect: Mood normal.        Behavior: Behavior normal.         Assessment And Plan:   Assessment & Plan Type 2 diabetes mellitus with stage 3a chronic kidney disease, without long-term current use of insulin (HCC) Type 2 diabetes managed with Januvia  and Farxiga. Blood sugar monitoring interrupted due to lack of test strips. - Bring paperwork for patient assistance program to obtain new prescription card for Farxiga. Hypertensive heart and renal disease with renal failure, stage 1 through stage 4 or unspecified chronic kidney disease, without heart failure Chronic, well controlled. She will c/w losartan  50mg  daily. She is encouraged to follow low sodium diet. She will f/u in 4 months for re-evaluation.  Atherosclerosis of aorta Chronic, LDL goal < 70.  She will c/w pravastatin  40mg  daily. She is encouraged to  follow heart healthy lifestyle.  Viral upper respiratory tract infection Recent infection with improved symptoms except persistent rhinorrhea. Differential includes COVID-19 or influenza, no testing performed. - Consider over-the-counter COVID and flu tests if symptoms persist. - Continue quercetin and oscilococcinum as supportive care. - Consider children's Zyrtec nightly - Avoid dairy while symptomatic.  General health maintenance Overdue annual wellness visit due to personal circumstances. Plans to complete by phone next month. - Schedule and complete annual wellness visit by phone next month.   Orders Placed This Encounter  Procedures   CMP14+EGFR   Hemoglobin A1c   Return if symptoms worsen or fail to improve.  Patient was given opportunity to ask questions. Patient verbalized understanding of the plan and was able to repeat key elements of the plan. All questions were answered to their satisfaction.    I, Catheryn LOISE Slocumb, MD, have reviewed all documentation for this visit. The documentation on 01/24/2024 for the exam, diagnosis, procedures, and orders are all accurate and complete.   IF YOU HAVE BEEN REFERRED TO A SPECIALIST, IT MAY TAKE 1-2 WEEKS TO SCHEDULE/PROCESS THE REFERRAL. IF YOU HAVE NOT HEARD FROM US /SPECIALIST IN TWO WEEKS, PLEASE GIVE US  A CALL AT [phone].

## 2024-01-24 NOTE — Patient Instructions (Signed)

## 2024-01-25 LAB — CMP14+EGFR
ALT: 9 IU/L (ref 0–32)
AST: 18 IU/L (ref 0–40)
Albumin: 4.4 g/dL (ref 3.7–4.7)
Alkaline Phosphatase: 56 IU/L (ref 48–129)
BUN/Creatinine Ratio: 11 — ABNORMAL LOW (ref 12–28)
BUN: 12 mg/dL (ref 8–27)
Bilirubin Total: 0.3 mg/dL (ref 0.0–1.2)
CO2: 25 mmol/L (ref 20–29)
Calcium: 9.9 mg/dL (ref 8.7–10.3)
Chloride: 99 mmol/L (ref 96–106)
Creatinine, Ser: 1.08 mg/dL — ABNORMAL HIGH (ref 0.57–1.00)
Globulin, Total: 2.7 g/dL (ref 1.5–4.5)
Glucose: 90 mg/dL (ref 70–99)
Potassium: 4.7 mmol/L (ref 3.5–5.2)
Sodium: 141 mmol/L (ref 134–144)
Total Protein: 7.1 g/dL (ref 6.0–8.5)
eGFR: 51 mL/min/1.73 — ABNORMAL LOW

## 2024-01-25 LAB — HEMOGLOBIN A1C
Est. average glucose Bld gHb Est-mCnc: 128 mg/dL
Hgb A1c MFr Bld: 6.1 % — ABNORMAL HIGH (ref 4.8–5.6)

## 2024-01-26 ENCOUNTER — Ambulatory Visit: Payer: Self-pay | Admitting: Internal Medicine

## 2024-01-26 DIAGNOSIS — N1831 Chronic kidney disease, stage 3a: Secondary | ICD-10-CM

## 2024-01-28 NOTE — Assessment & Plan Note (Signed)
 Type 2 diabetes managed with Januvia  and Farxiga. Blood sugar monitoring interrupted due to lack of test strips. - Bring paperwork for patient assistance program to obtain new prescription card for Farxiga.

## 2024-01-28 NOTE — Assessment & Plan Note (Signed)
Chronic, LDL goal < 70.  She will c/w pravastatin 40mg  daily. She is encouraged to follow heart healthy lifestyle.

## 2024-01-28 NOTE — Assessment & Plan Note (Signed)
 Chronic, well controlled. She will c/w losartan 50mg  daily. She is encouraged to follow low sodium diet. She will f/u in 4 months for re-evaluation.

## 2024-02-03 ENCOUNTER — Inpatient Hospital Stay: Admission: RE | Admit: 2024-02-03 | Source: Ambulatory Visit

## 2024-02-09 ENCOUNTER — Ambulatory Visit
Admission: RE | Admit: 2024-02-09 | Discharge: 2024-02-09 | Disposition: A | Discharge status: Home / Self Care | Source: Ambulatory Visit | Attending: Internal Medicine | Admitting: Internal Medicine

## 2024-02-09 DIAGNOSIS — N1831 Chronic kidney disease, stage 3a: Secondary | ICD-10-CM

## 2024-02-13 ENCOUNTER — Ambulatory Visit: Payer: Self-pay | Admitting: Internal Medicine

## 2024-03-21 ENCOUNTER — Ambulatory Visit: Payer: Self-pay

## 2024-06-05 ENCOUNTER — Encounter: Payer: Self-pay | Admitting: Internal Medicine
